# Patient Record
Sex: Male | Born: 1965 | Race: White | Hispanic: No | Marital: Married | Smoking: Current every day smoker
Health system: Southern US, Community
[De-identification: ages and names within clinical notes are randomized; demographics above are authoritative.]

## PROBLEM LIST (undated history)

## (undated) DIAGNOSIS — I10 Essential (primary) hypertension: Secondary | ICD-10-CM

## (undated) DIAGNOSIS — E785 Hyperlipidemia, unspecified: Secondary | ICD-10-CM

## (undated) DIAGNOSIS — K429 Umbilical hernia without obstruction or gangrene: Secondary | ICD-10-CM

## (undated) DIAGNOSIS — T7840XA Allergy, unspecified, initial encounter: Secondary | ICD-10-CM

## (undated) HISTORY — DX: Essential (primary) hypertension: I10

## (undated) HISTORY — DX: Hyperlipidemia, unspecified: E78.5

## (undated) HISTORY — PX: UVULOPALATOPHARYNGOPLASTY: SHX827

## (undated) HISTORY — PX: VARICOSE VEIN SURGERY: SHX832

## (undated) HISTORY — DX: Allergy, unspecified, initial encounter: T78.40XA

---

## 2012-09-03 ENCOUNTER — Ambulatory Visit: Payer: Self-pay | Admitting: Physician Assistant

## 2012-09-03 VITALS — BP 188/128 | HR 81 | Temp 98.5°F | Resp 18 | Ht 70.5 in | Wt 268.2 lb

## 2012-09-03 DIAGNOSIS — Z789 Other specified health status: Secondary | ICD-10-CM

## 2012-09-03 DIAGNOSIS — I10 Essential (primary) hypertension: Secondary | ICD-10-CM

## 2012-09-03 LAB — BASIC METABOLIC PANEL
CO2: 28 mEq/L (ref 19–32)
Chloride: 107 mEq/L (ref 96–112)
Creat: 0.71 mg/dL (ref 0.50–1.35)
Sodium: 140 mEq/L (ref 135–145)

## 2012-09-03 MED ORDER — YELLOW FEVER VACCINE ~~LOC~~ INJ: 0.5000 mL | INJECTION | Freq: Once | SUBCUTANEOUS | Status: DC

## 2012-09-03 MED ORDER — ONDANSETRON 4 MG PO TBDP: 4.0000 mg | ORAL_TABLET | Freq: Three times a day (TID) | ORAL | Status: DC | PRN

## 2012-09-03 MED ORDER — LISINOPRIL-HYDROCHLOROTHIAZIDE 10-12.5 MG PO TABS: 1.0000 | ORAL_TABLET | Freq: Every day | ORAL | Status: DC

## 2012-09-03 MED ORDER — DOXYCYCLINE HYCLATE 100 MG PO CAPS: ORAL_CAPSULE | ORAL | Status: DC

## 2012-09-03 MED ORDER — CIPROFLOXACIN HCL 250 MG PO TABS: 250.0000 mg | ORAL_TABLET | Freq: Two times a day (BID) | ORAL | Status: DC | PRN

## 2012-09-03 MED ORDER — TYPHOID VI POLYSACCHARIDE VACC 25 MCG/0.5ML IM SOLN: 0.5000 mL | Freq: Once | INTRAMUSCULAR | Status: DC

## 2012-09-03 NOTE — Progress Notes (Signed)
[name] [name] ID: [name] [name]. MRN: [id number], DOB: July 12, 1965, 47 y.o. Date of Encounter: 09/03/2012, 11:51 AM  Primary Physician: No primary provider on file.  Chief Complaint: Immunization review  HPI: 47 y.o. male with history below presents for immunization review. [name] [name] will be traveling to [place] on 09/07/12. Work related trip. Will be staying in [place], [place] for 6 weeks. [name] [name] travels out of the country frequently for work. Has one international immunization record with him, but has lost the other. This record does indicate an up to date tetanus vaccine from 2012 and a Typhim vaccine from 07/2010. He does believe that he has received the yellow fever vaccine within the past couple of years as he recently spent 6 months in [place], but this is not on the immunization record he brought today. The CDC travel guide does recommend he receive yellow fever vaccine for this trip. He also requests cipro for this trip. States that he has had hepatitis A and hepatitis B vaccines. Will not be around or handling animals.     Of note [name] [name] has a history of hypertension, BP at triage today is 188/128. No chest pain, headache, visual changes, or focal deficits. He does not have any chest pains, visual changes, or focal deficits. Mild headache currently. Blood pressure usually 120/80 when he takes his medication. States that "it starts with a T." PCP is in [place]. Dr. Ivory Broad. He is uncertain if he will be able to be able to see him prior to leaving the country. He does smoke daily, and knows that he must quit.   Past Medical History  Diagnosis Date  . Allergy   . Hypertension   . Hyperlipidemia      Home Meds: Prior to Admission medications   Not on File    Allergies: No Known Allergies  History   Social History  . Marital Status: Married    Spouse Name: N/A    Number of Children: N/A  . Years of Education: N/A   Occupational History  . Not on file.   Social History Main Topics   . Smoking status: Current Every Day Smoker -- 2.00 packs/day  . Smokeless tobacco: Not on file  . Alcohol Use: Not on file  . Drug Use: No  . Sexually Active: Yes   Other Topics Concern  . Not on file   Social History Narrative  . No narrative on file     Review of Systems: Constitutional: negative for chills, fever, night sweats, weight changes, or fatigue  HEENT: negative for vision changes, hearing loss, congestion, rhinorrhea, ST, epistaxis, or sinus pressure Cardiovascular: negative for chest pain, or palpitations Respiratory: negative for hemoptysis, wheezing, shortness of breath, or cough Abdominal: negative for abdominal pain, nausea, vomiting, diarrhea, or constipation Dermatological: negative for rash Neurologic: positive for mild headache. negative for dizziness, or syncope   Physical Exam: Blood pressure 188/128, pulse 81, temperature 98.5 F (36.9 C), temperature source Oral, resp. rate 18, height 5' 10.5" (1.791 m), weight 268 lb 3.2 oz (121.655 kg), SpO2 98.00%., Body mass index is 37.93 kg/(m^2). General: Well developed, well nourished, in no acute distress. Head: Normocephalic, atraumatic, eyes without discharge, sclera non-icteric, nares are without discharge.   Neck: Supple. Full ROM.  Lungs: Breathing is unlabored. Heart: Regular rate. Msk:  Strength and tone normal for age. Extremities/Skin: Warm and dry. No clubbing or cyanosis. No edema. No rashes or suspicious lesions. Neuro: Alert and oriented X 3. Moves all extremities spontaneously. Gait is normal. CNII-XII  grossly in tact. Psych:  Responds to questions appropriately with a normal affect.   Labs:  BMP pending  ASSESSMENT AND PLAN:  47 y.o. male here for travel medicine with uncontrolled hypertension.  1) Travel medicine -Doxycycline 100 mg 1 po daily, start 2 days before trip, continue daily, and 4 weeks after your trip. #72 no RF - Typhim 25 mcg/0.5 mL inj Inject 0.5 mL intramuscularly once #1  no RF -YF-Vax 0.5 mL inj Inject 0.5 mL subcutaneous once #1 no RF -Cipro 250 mg 1 po bid prn diarrhea #40 no RF -Zofran ODT 4 mg 1 po tid prn nausea #20 no RF -Up to date with tetanus, hepatitis A, and hepatitis B vaccines -Discussed [place] and Dengue prevention -No known rabies exposures  -Discussed safe travel tips  2) Hypertension -Uncontrolled -Unable to see his PCP prior to leaving the country -Given prescription of lisinopril/HCTZ 10/12.5 mg 1 po daily #90 no RF -Discussed with [name] [name] dangers of his elevated blood pressure, he understands this -Must quit tobacco use -RTC/ED precautions -Follow up when back in the country with [place] or PCP   Signed, Eula Listen, PA-C 09/03/2012 11:51 AM

## 2014-04-17 ENCOUNTER — Ambulatory Visit (INDEPENDENT_AMBULATORY_CARE_PROVIDER_SITE_OTHER): Payer: BC Managed Care – PPO | Admitting: Internal Medicine

## 2014-04-17 ENCOUNTER — Ambulatory Visit (INDEPENDENT_AMBULATORY_CARE_PROVIDER_SITE_OTHER): Payer: BC Managed Care – PPO

## 2014-04-17 VITALS — BP 164/118 | HR 76 | Temp 98.6°F | Resp 16 | Ht 71.0 in | Wt 256.0 lb

## 2014-04-17 DIAGNOSIS — Z79899 Other long term (current) drug therapy: Secondary | ICD-10-CM

## 2014-04-17 DIAGNOSIS — Z23 Encounter for immunization: Secondary | ICD-10-CM

## 2014-04-17 DIAGNOSIS — I10 Essential (primary) hypertension: Secondary | ICD-10-CM

## 2014-04-17 DIAGNOSIS — Z0000 Encounter for general adult medical examination without abnormal findings: Secondary | ICD-10-CM

## 2014-04-17 DIAGNOSIS — K435 Parastomal hernia without obstruction or  gangrene: Secondary | ICD-10-CM

## 2014-04-17 DIAGNOSIS — K429 Umbilical hernia without obstruction or gangrene: Secondary | ICD-10-CM

## 2014-04-17 DIAGNOSIS — Z125 Encounter for screening for malignant neoplasm of prostate: Secondary | ICD-10-CM

## 2014-04-17 LAB — POCT CBC
GRANULOCYTE PERCENT: 62.4 % (ref 37–80)
HEMATOCRIT: 48.7 % (ref 43.5–53.7)
HEMOGLOBIN: 16.3 g/dL (ref 14.1–18.1)
LYMPH, POC: 2.9 (ref 0.6–3.4)
MCH, POC: 31.1 pg (ref 27–31.2)
MCHC: 33.5 g/dL (ref 31.8–35.4)
MCV: 92.8 fL (ref 80–97)
MID (cbc): 0.5 (ref 0–0.9)
MPV: 8.9 fL (ref 0–99.8)
POC GRANULOCYTE: 5.7 (ref 2–6.9)
POC LYMPH PERCENT: 31.9 %L (ref 10–50)
POC MID %: 5.7 %M (ref 0–12)
Platelet Count, POC: 195 10*3/uL (ref 142–424)
RBC: 5.25 M/uL (ref 4.69–6.13)
RDW, POC: 14.4 %
WBC: 9.1 10*3/uL (ref 4.6–10.2)

## 2014-04-17 LAB — GLUCOSE, POCT (MANUAL RESULT ENTRY): POC GLUCOSE: 112 mg/dL — AB (ref 70–99)

## 2014-04-17 LAB — POCT UA - MICROSCOPIC ONLY
BACTERIA, U MICROSCOPIC: NEGATIVE
Casts, Ur, LPF, POC: NEGATIVE
Crystals, Ur, HPF, POC: NEGATIVE
Epithelial cells, urine per micros: NEGATIVE
Mucus, UA: NEGATIVE
RBC, URINE, MICROSCOPIC: NEGATIVE
Yeast, UA: NEGATIVE

## 2014-04-17 LAB — POCT URINALYSIS DIPSTICK
BILIRUBIN UA: NEGATIVE
Blood, UA: NEGATIVE
GLUCOSE UA: NEGATIVE
KETONES UA: NEGATIVE
LEUKOCYTES UA: NEGATIVE
Nitrite, UA: NEGATIVE
Protein, UA: 100
SPEC GRAV UA: 1.02
Urobilinogen, UA: 0.2
pH, UA: 7

## 2014-04-17 LAB — IFOBT (OCCULT BLOOD): IMMUNOLOGICAL FECAL OCCULT BLOOD TEST: NEGATIVE

## 2014-04-17 MED ORDER — LISINOPRIL-HYDROCHLOROTHIAZIDE 20-12.5 MG PO TABS: 1.0000 | ORAL_TABLET | Freq: Every day | ORAL | Status: DC

## 2014-04-17 MED ORDER — ATORVASTATIN CALCIUM 20 MG PO TABS: 20.0000 mg | ORAL_TABLET | Freq: Every day | ORAL | Status: DC

## 2014-04-17 NOTE — Progress Notes (Signed)
Subjective:    Patient ID: [name] RalphHerman Bauza Jr., male    DOB: May 03, 1966, 48 y.o.   MRN: [id number]  HPI  Patient is 48 years old male being seen for annual physical. He is being as well for his hypertension. Patient is a smoker for 25 years. Patient has a family history of diabetes and heart disease. Family history of hypertension.     Not taking his BP meds, does not want to quit smoking.  Review of Systems  Constitutional: Positive for diaphoresis and fatigue.  HENT: Negative.   Eyes: Negative.   Respiratory: Negative.   Cardiovascular: Negative.   Gastrointestinal: Negative.   Endocrine: Negative.   Genitourinary: Negative.   Musculoskeletal: Negative.   Skin: Negative.   Allergic/Immunologic: Negative.   Neurological: Negative.   Hematological: Negative.   Psychiatric/Behavioral: Negative.   All other systems reviewed and are negative.  EKG RBBB done for base line    Objective:   Physical Exam  Constitutional: He is oriented to person, place, and time. He appears well-developed and well-nourished.  HENT:  Head: Normocephalic.  Right Ear: External ear normal.  Left Ear: External ear normal.  Nose: Nose normal.  Mouth/Throat: Uvula is midline and mucous membranes are normal. Posterior oropharyngeal edema and posterior oropharyngeal erythema present.  Eyes: Conjunctivae and EOM are normal. Pupils are equal, round, and reactive to light.  Neck: Normal range of motion. Neck supple. No tracheal deviation present. No thyromegaly present.  Cardiovascular: Normal rate, regular rhythm, normal heart sounds and intact distal pulses.   Pulmonary/Chest: Effort normal. No accessory muscle usage. No tachypnea. No respiratory distress. He has decreased breath sounds. He has no wheezes. He has rhonchi. He has no rales.  Abdominal: Soft. Bowel sounds are normal. He exhibits mass. He exhibits no distension. There is no hepatosplenomegaly. There is tenderness. There is no rebound, no  guarding and no CVA tenderness. A hernia is present.    Tender reducible hernia, para umbilical  Genitourinary: Rectum normal, prostate normal and penis normal.  Musculoskeletal: Normal range of motion.  Neurological: He is alert and oriented to person, place, and time. He has normal reflexes. No cranial nerve deficit. He exhibits normal muscle tone. Coordination normal.  Psychiatric: He has a normal mood and affect. His behavior is normal. Judgment and thought content normal.   [facility] reading (PRIMARY) by  Dr.Mykayla Brinton fullness right and anterior mediastinum, increased markings suggestive of chronic bronchitis  Results for orders placed or performed in visit on 04/17/14  POCT CBC  Result Value Ref Range   WBC 9.1 4.6 - 10.2 K/uL   Lymph, poc 2.9 0.6 - 3.4   POC LYMPH PERCENT 31.9 10 - 50 %L   MID (cbc) 0.5 0 - 0.9   POC MID % 5.7 0 - 12 %M   POC Granulocyte 5.7 2 - 6.9   Granulocyte percent 62.4 37 - 80 %G   RBC 5.25 4.69 - 6.13 M/uL   Hemoglobin 16.3 14.1 - 18.1 g/dL   HCT, POC 40.948.7 81.143.5 - 53.7 %   MCV 92.8 80 - 97 fL   MCH, POC 31.1 27 - 31.2 pg   MCHC 33.5 31.8 - 35.4 g/dL   RDW, POC 91.414.4 %   Platelet Count, POC 195 142 - 424 K/uL   MPV 8.9 0 - 99.8 fL  POCT glucose (manual entry)  Result Value Ref Range   POC Glucose 112 (A) 70 - 99 mg/dl  IFOBT POC (occult bld, rslt in office)  Result  Value Ref Range   IFOBT Negative   POCT UA - Microscopic Only  Result Value Ref Range   WBC, Ur, HPF, POC 0-3    RBC, urine, microscopic neg    Bacteria, U Microscopic neg    Mucus, UA neg    Epithelial cells, urine per micros neg    Crystals, Ur, HPF, POC neg    Casts, Ur, LPF, POC neg    Yeast, UA neg   POCT urinalysis dipstick  Result Value Ref Range   Color, UA yellow    Clarity, UA clear    Glucose, UA neg    Bilirubin, UA neg    Ketones, UA neg    Spec Grav, UA 1.020    Blood, UA neg    pH, UA 7.0    Protein, UA 100    Urobilinogen, UA 0.2    Nitrite, UA neg     Leukocytes, UA Negative            Assessment & Plan:  Quit smoking info Lisinopril 20mg /hct 12.5 qd Dash diet Flu vaccine Refer to surgeon for umbilical hernia

## 2014-04-17 NOTE — Patient Instructions (Addendum)
Hypertension Hypertension, commonly called high blood pressure, is when the force of blood pumping through your arteries is too strong. Your arteries are the blood vessels that carry blood from your heart throughout your body. A blood pressure reading consists of a higher number over a lower number, such as 110/72. The higher number (systolic) is the pressure inside your arteries when your heart pumps. The lower number (diastolic) is the pressure inside your arteries when your heart relaxes. Ideally you want your blood pressure below 120/80. Hypertension forces your heart to work harder to pump blood. Your arteries may become narrow or stiff. Having hypertension puts you at risk for heart disease, stroke, and other problems.  RISK FACTORSSmoking Cessation Quitting smoking is important to your health and has many advantages. However, it is not always easy to quit since nicotine is a very addictive drug. Oftentimes, people try 3 times or more before being able to quit. This document explains the best ways for you to prepare to quit smoking. Quitting takes hard work and a lot of effort, but you can do it. ADVANTAGES OF QUITTING SMOKING  You will live longer, feel better, and live better.  Your body will feel the impact of quitting smoking almost immediately.  Within 20 minutes, blood pressure decreases. Your pulse returns to its normal level.  After 8 hours, carbon monoxide levels in the blood return to normal. Your oxygen level increases.  After 24 hours, the chance of having a heart attack starts to decrease. Your breath, hair, and body stop smelling like smoke.  After 48 hours, damaged nerve endings begin to recover. Your sense of taste and smell improve.  After 72 hours, the body is virtually free of nicotine. Your bronchial tubes relax and breathing becomes easier.  After 2 to 12 weeks, lungs can hold more air. Exercise becomes easier and circulation improves.  The risk of having a heart  attack, stroke, cancer, or lung disease is greatly reduced.  After 1 year, the risk of coronary heart disease is cut in half.  After 5 years, the risk of stroke falls to the same as a nonsmoker.  After 10 years, the risk of lung cancer is cut in half and the risk of other cancers decreases significantly.  After 15 years, the risk of coronary heart disease drops, usually to the level of a nonsmoker.  If you are pregnant, quitting smoking will improve your chances of having a healthy baby.  The people you live with, especially any children, will be healthier.  You will have extra money to spend on things other than cigarettes. QUESTIONS TO THINK ABOUT BEFORE ATTEMPTING TO QUIT You may want to talk about your answers with your health care provider.  Why do you want to quit?  If you tried to quit in the past, what helped and what did not?  What will be the most difficult situations for you after you quit? How will you plan to handle them?  Who can help you through the tough times? Your family? Friends? A health care provider?  What pleasures do you get from smoking? What ways can you still get pleasure if you quit? Here are some questions to ask your health care provider:  How can you help me to be successful at quitting?  What medicine do you think would be best for me and how should I take it?  What should I do if I need more help?  What is smoking withdrawal like? How can I get  information on withdrawal? GET READY  Set a quit date.  Change your environment by getting rid of all cigarettes, ashtrays, matches, and lighters in your home, car, or work. Do not let people smoke in your home.  Review your past attempts to quit. Think about what worked and what did not. GET SUPPORT AND ENCOURAGEMENT You have a better chance of being successful if you have help. You can get support in many ways.  Tell your family, friends, and coworkers that you are going to quit and need their  support. Ask them not to smoke around you.  Get individual, group, or telephone counseling and support. Programs are available at [employer] and health centers. Call your local health department for information about programs in your area.  Spiritual beliefs and practices may help some smokers quit.  Download a "quit meter" on your computer to keep track of quit statistics, such as how long you have gone without smoking, cigarettes not smoked, and money saved.  Get a self-help book about quitting smoking and staying off tobacco. LEARN NEW SKILLS AND BEHAVIORS  Distract yourself from urges to smoke. Talk to someone, go for a walk, or occupy your time with a task.  Change your normal routine. Take a different route to work. Drink tea instead of coffee. Eat breakfast in a different place.  Reduce your stress. Take a hot bath, exercise, or read a book.  Plan something enjoyable to do every day. Reward yourself for not smoking.  Explore interactive web-based programs that specialize in helping you quit. GET MEDICINE AND USE IT CORRECTLY Medicines can help you stop smoking and decrease the urge to smoke. Combining medicine with the above behavioral methods and support can greatly increase your chances of successfully quitting smoking.  Nicotine replacement therapy helps deliver nicotine to your body without the negative effects and risks of smoking. Nicotine replacement therapy includes nicotine gum, lozenges, inhalers, nasal sprays, and skin patches. Some may be available over-the-counter and others require a prescription.  Antidepressant medicine helps people abstain from smoking, but how this works is unknown. This medicine is available by prescription.  Nicotinic receptor partial agonist medicine simulates the effect of nicotine in your brain. This medicine is available by prescription. Ask your health care provider for advice about which medicines to use and how to use them based on  your health history. Your health care provider will tell you what side effects to look out for if you choose to be on a medicine or therapy. Carefully read the information on the package. Do not use any other product containing nicotine while using a nicotine replacement product.  RELAPSE OR DIFFICULT SITUATIONS Most relapses occur within the first 3 months after quitting. Do not be discouraged if you start smoking again. Remember, most people try several times before finally quitting. You may have symptoms of withdrawal because your body is used to nicotine. You may crave cigarettes, be irritable, feel very hungry, cough often, get headaches, or have difficulty concentrating. The withdrawal symptoms are only temporary. They are strongest when you first quit, but they will go away within 10-14 days. To reduce the chances of relapse, try to:  Avoid drinking alcohol. Drinking lowers your chances of successfully quitting.  Reduce the amount of caffeine you consume. Once you quit smoking, the amount of caffeine in your body increases and can give you symptoms, such as a rapid heartbeat, sweating, and anxiety.  Avoid smokers because they can make you want to smoke.  Do not let weight gain distract you. Many smokers will gain weight when they quit, usually less than 10 pounds. Eat a healthy diet and stay active. You can always lose the weight gained after you quit.  Find ways to improve your mood other than smoking. FOR MORE INFORMATION  www.smokefree.gov  Document Released: 05/23/2001 Document Revised: 10/13/2013 Document Reviewed: 09/07/2011 [facility] Patient Information 2015 [place], [place]. This information is not intended to replace advice given to you by your health care provider. Make sure you discuss any questions you have with your health care provider.  Some risk factors for high blood pressure are controllable. Others are not.  Risk factors you cannot control include:   Race. You may be at  higher risk if you are African American.  Age. Risk increases with age.  Gender. Men are at higher risk than women before age 49 years. After age 49, women are at higher risk than men. Risk factors you can control include:  Not getting enough exercise or physical activity.  Being overweight.  Getting too much fat, sugar, calories, or salt in your diet.  Drinking too much alcohol. SIGNS AND SYMPTOMS Hypertension does not usually cause signs or symptoms. Extremely high blood pressure (hypertensive crisis) may cause headache, anxiety, shortness of breath, and nosebleed. DIAGNOSIS  To check if you have hypertension, your health care provider will measure your blood pressure while you are seated, with your arm held at the level of your heart. It should be measured at least twice using the same arm. Certain conditions can cause a difference in blood pressure between your right and left arms. A blood pressure reading that is higher than normal on one occasion does not mean that you need treatment. If one blood pressure reading is high, ask your health care provider about having it checked again. TREATMENT  Treating high blood pressure includes making lifestyle changes and possibly taking medicine. Living a healthy lifestyle can help lower high blood pressure. You may need to change some of your habits. Lifestyle changes may include:  Following the DASH diet. This diet is high in fruits, vegetables, and whole grains. It is low in salt, red meat, and added sugars.  Getting at least 2 hours of brisk physical activity every week.  Losing weight if necessary.  Not smoking.  Limiting alcoholic beverages.  Learning ways to reduce stress. If lifestyle changes are not enough to get your blood pressure under control, your health care provider may prescribe medicine. You may need to take more than one. Work closely with your health care provider to understand the risks and benefits. HOME CARE  INSTRUCTIONS  Have your blood pressure rechecked as directed by your health care provider.   Take medicines only as directed by your health care provider. Follow the directions carefully. Blood pressure medicines must be taken as prescribed. The medicine does not work as well when you skip doses. Skipping doses also puts you at risk for problems.   Do not smoke.   Monitor your blood pressure at home as directed by your health care provider. SEEK MEDICAL CARE IF:   You think you are having a reaction to medicines taken.  You have recurrent headaches or feel dizzy.  You have swelling in your ankles.  You have trouble with your vision. SEEK IMMEDIATE MEDICAL CARE IF:  You develop a severe headache or confusion.  You have unusual weakness, numbness, or feel faint.  You have severe chest or abdominal pain.  You vomit repeatedly.  You have trouble breathing. MAKE SURE YOU:   Understand these instructions.  Will watch your condition.  Will get help right away if you are not doing well or get worse. Document Released: 05/29/2005 Document Revised: 10/13/2013 Document Reviewed: 03/21/2013 [facility] Patient Information 2015 [place], [place]. This information is not intended to replace advice given to you by your health care provider. Make sure you discuss any questions you have with your health care provider. DASH Eating Plan DASH stands for "Dietary Approaches to Stop Hypertension." The DASH eating plan is a healthy eating plan that has been shown to reduce high blood pressure (hypertension). Additional health benefits may include reducing the risk of type 2 diabetes mellitus, heart disease, and stroke. The DASH eating plan may also help with weight loss. WHAT DO I NEED TO KNOW ABOUT THE DASH EATING PLAN? For the DASH eating plan, you will follow these general guidelines:  Choose foods with a percent daily value for sodium of less than 5% (as listed on the food label).  Use  salt-free seasonings or herbs instead of table salt or sea salt.  Check with your health care provider or pharmacist before using salt substitutes.  Eat lower-sodium products, often labeled as "lower sodium" or "no salt added."  Eat fresh foods.  Eat more vegetables, fruits, and low-fat dairy products.  Choose whole grains. Look for the word "whole" as the first word in the ingredient list.  Choose fish and skinless chicken or [place] more often than red meat. Limit fish, poultry, and meat to 6 oz (170 g) each day.  Limit sweets, desserts, sugars, and sugary drinks.  Choose heart-healthy fats.  Limit cheese to 1 oz (28 g) per day.  Eat more home-cooked food and less restaurant, buffet, and fast food.  Limit fried foods.  Cook foods using methods other than frying.  Limit canned vegetables. If you do use them, rinse them well to decrease the sodium.  When eating at a restaurant, ask that your food be prepared with less salt, or no salt if possible. WHAT FOODS CAN I EAT? Seek help from a dietitian for individual calorie needs. Grains Whole grain or whole wheat bread. Brown rice. Whole grain or whole wheat pasta. Quinoa, bulgur, and whole grain cereals. Low-sodium cereals. Corn or whole wheat flour tortillas. Whole grain cornbread. Whole grain crackers. Low-sodium crackers. Vegetables Fresh or frozen vegetables (raw, steamed, roasted, or grilled). Low-sodium or reduced-sodium tomato and vegetable juices. Low-sodium or reduced-sodium tomato sauce and paste. Low-sodium or reduced-sodium canned vegetables.  Fruits All fresh, canned (in natural juice), or frozen fruits. Meat and Other Protein Products Ground beef (85% or leaner), grass-fed beef, or beef trimmed of fat. Skinless chicken or [place]. Ground chicken or [place]. Pork trimmed of fat. All fish and seafood. Eggs. Dried beans, peas, or lentils. Unsalted nuts and seeds. Unsalted canned beans. Dairy Low-fat dairy products, such as  skim or 1% milk, 2% or reduced-fat cheeses, low-fat ricotta or cottage cheese, or plain low-fat yogurt. Low-sodium or reduced-sodium cheeses. Fats and Oils Tub margarines without trans fats. Light or reduced-fat mayonnaise and salad dressings (reduced sodium). Avocado. Safflower, olive, or canola oils. Natural peanut or almond butter. Other Unsalted popcorn and pretzels. The items listed above may not be a complete list of recommended foods or beverages. Contact your dietitian for more options. WHAT FOODS ARE NOT RECOMMENDED? Grains White bread. White pasta. White rice. Refined cornbread. Bagels and croissants. Crackers that contain trans fat. Vegetables Creamed or fried vegetables. Vegetables in  a cheese sauce. Regular canned vegetables. Regular canned tomato sauce and paste. Regular tomato and vegetable juices. Fruits Dried fruits. Canned fruit in light or heavy syrup. Fruit juice. Meat and Other Protein Products Fatty cuts of meat. Ribs, chicken wings, bacon, sausage, bologna, salami, chitterlings, fatback, hot dogs, bratwurst, and packaged luncheon meats. Salted nuts and seeds. Canned beans with salt. Dairy Whole or 2% milk, cream, half-and-half, and cream cheese. Whole-fat or sweetened yogurt. Full-fat cheeses or blue cheese. Nondairy creamers and whipped toppings. Processed cheese, cheese spreads, or cheese curds. Condiments Onion and garlic salt, seasoned salt, table salt, and sea salt. Canned and packaged gravies. Worcestershire sauce. Tartar sauce. Barbecue sauce. Teriyaki sauce. Soy sauce, including reduced sodium. Steak sauce. Fish sauce. Oyster sauce. Cocktail sauce. Horseradish. Ketchup and mustard. Meat flavorings and tenderizers. Bouillon cubes. Hot sauce. Tabasco sauce. Marinades. Taco seasonings. Relishes. Fats and Oils Butter, stick margarine, lard, shortening, ghee, and bacon fat. Coconut, palm kernel, or palm oils. Regular salad dressings. Other Pickles and olives. Salted  popcorn and pretzels. The items listed above may not be a complete list of foods and beverages to avoid. Contact your dietitian for more information. WHERE CAN I FIND MORE INFORMATION? National Heart, Lung, and Blood Institute: [employer].itwww.nhlbi.nih.gov/health/health-topics/topics/dash/ Document Released: 05/18/2011 Document Revised: 10/13/2013 Document Reviewed: 04/02/2013 [facility] Patient Information 2015 [place], [place]. This information is not intended to replace advice given to you by your health care provider. Make sure you discuss any questions you have with your health care provider. Hernia A hernia occurs when an internal organ pushes out through a weak spot in the abdominal wall. Hernias most commonly occur in the groin and around the navel. Hernias often can be pushed back into place (reduced). Most hernias tend to get worse over time. Some abdominal hernias can get stuck in the opening (irreducible or incarcerated hernia) and cannot be reduced. An irreducible abdominal hernia which is tightly squeezed into the opening is at risk for impaired blood supply (strangulated hernia). A strangulated hernia is a medical emergency. Because of the risk for an irreducible or strangulated hernia, surgery may be recommended to repair a hernia. CAUSES   Heavy lifting.  Prolonged coughing.  Straining to have a bowel movement.  A cut (incision) made during an abdominal surgery. HOME CARE INSTRUCTIONS   Bed rest is not required. You may continue your normal activities.  Avoid lifting more than 10 pounds (4.5 kg) or straining.  Cough gently. If you are a smoker it is best to stop. Even the best hernia repair can break down with the continual strain of coughing. Even if you do not have your hernia repaired, a cough will continue to aggravate the problem.  Do not wear anything tight over your hernia. Do not try to keep it in with an outside bandage or truss. These can damage abdominal contents if they are  trapped within the hernia sac.  Eat a normal diet.  Avoid constipation. Straining over long periods of time will increase hernia size and encourage breakdown of repairs. If you cannot do this with diet alone, stool softeners may be used. SEEK IMMEDIATE MEDICAL CARE IF:   You have a fever.  You develop increasing abdominal pain.  You feel nauseous or vomit.  Your hernia is stuck outside the abdomen, looks discolored, feels hard, or is tender.  You have any changes in your bowel habits or in the hernia that are unusual for you.  You have increased pain or swelling around the hernia.  You  cannot push the hernia back in place by applying gentle pressure while lying down. MAKE SURE YOU:   Understand these instructions.  Will watch your condition.  Will get help right away if you are not doing well or get worse. Document Released: 05/29/2005 Document Revised: 08/21/2011 Document Reviewed: 01/16/2008 [facility] Patient Information 2015 [place], [place]. This information is not intended to replace advice given to you by your health care provider. Make sure you discuss any questions you have with your health care provider.

## 2014-04-18 LAB — LIPID PANEL
Cholesterol: 177 mg/dL (ref 0–200)
HDL: 61 mg/dL (ref >39)
LDL Cholesterol: 91 mg/dL (ref 0–99)
TRIGLYCERIDES: 125 mg/dL (ref <150)
Total CHOL/HDL Ratio: 2.9 Ratio
VLDL: 25 mg/dL (ref 0–40)

## 2014-04-18 LAB — COMPREHENSIVE METABOLIC PANEL
ALBUMIN: 3.9 g/dL (ref 3.5–5.2)
ALT: 15 U/L (ref 0–53)
AST: 17 U/L (ref 0–37)
Alkaline Phosphatase: 72 U/L (ref 39–117)
BUN: 14 mg/dL (ref 6–23)
CO2: 25 meq/L (ref 19–32)
Calcium: 9.1 mg/dL (ref 8.4–10.5)
Chloride: 104 mEq/L (ref 96–112)
Creat: 0.67 mg/dL (ref 0.50–1.35)
GLUCOSE: 111 mg/dL — AB (ref 70–99)
POTASSIUM: 4.4 meq/L (ref 3.5–5.3)
SODIUM: 141 meq/L (ref 135–145)
TOTAL PROTEIN: 6.5 g/dL (ref 6.0–8.3)
Total Bilirubin: 0.7 mg/dL (ref 0.2–1.2)

## 2014-04-18 LAB — TSH: TSH: 1.255 u[IU]/mL (ref 0.350–4.500)

## 2014-04-18 LAB — PSA: PSA: 1.91 ng/mL (ref <=4.00)

## 2014-04-19 ENCOUNTER — Encounter: Payer: Self-pay | Admitting: *Deleted

## 2015-09-18 ENCOUNTER — Other Ambulatory Visit: Payer: Self-pay | Admitting: Internal Medicine

## 2015-12-09 ENCOUNTER — Other Ambulatory Visit: Payer: Self-pay | Admitting: Internal Medicine

## 2016-01-14 ENCOUNTER — Inpatient Hospital Stay (HOSPITAL_COMMUNITY)
Admission: EM | Admit: 2016-01-14 | Discharge: 2016-01-16 | DRG: 065 | Disposition: A | Discharge status: Rehabilitation Facility | Payer: Managed Care, Other (non HMO) | Attending: Internal Medicine | Admitting: Internal Medicine

## 2016-01-14 ENCOUNTER — Emergency Department (HOSPITAL_COMMUNITY): Payer: Managed Care, Other (non HMO)

## 2016-01-14 ENCOUNTER — Encounter (HOSPITAL_COMMUNITY): Payer: Self-pay

## 2016-01-14 DIAGNOSIS — Z79899 Other long term (current) drug therapy: Secondary | ICD-10-CM

## 2016-01-14 DIAGNOSIS — Z6837 Body mass index (BMI) 37.0-37.9, adult: Secondary | ICD-10-CM

## 2016-01-14 DIAGNOSIS — Z8249 Family history of ischemic heart disease and other diseases of the circulatory system: Secondary | ICD-10-CM

## 2016-01-14 DIAGNOSIS — E876 Hypokalemia: Secondary | ICD-10-CM | POA: Diagnosis present

## 2016-01-14 DIAGNOSIS — E669 Obesity, unspecified: Secondary | ICD-10-CM | POA: Diagnosis present

## 2016-01-14 DIAGNOSIS — I6302 Cerebral infarction due to thrombosis of basilar artery: Secondary | ICD-10-CM | POA: Diagnosis not present

## 2016-01-14 DIAGNOSIS — I639 Cerebral infarction, unspecified: Secondary | ICD-10-CM

## 2016-01-14 DIAGNOSIS — F17200 Nicotine dependence, unspecified, uncomplicated: Secondary | ICD-10-CM

## 2016-01-14 DIAGNOSIS — R2990 Unspecified symptoms and signs involving the nervous system: Secondary | ICD-10-CM

## 2016-01-14 DIAGNOSIS — I6350 Cerebral infarction due to unspecified occlusion or stenosis of unspecified cerebral artery: Secondary | ICD-10-CM | POA: Diagnosis not present

## 2016-01-14 DIAGNOSIS — R471 Dysarthria and anarthria: Secondary | ICD-10-CM | POA: Diagnosis present

## 2016-01-14 DIAGNOSIS — G8191 Hemiplegia, unspecified affecting right dominant side: Secondary | ICD-10-CM | POA: Diagnosis present

## 2016-01-14 DIAGNOSIS — F17210 Nicotine dependence, cigarettes, uncomplicated: Secondary | ICD-10-CM | POA: Diagnosis present

## 2016-01-14 DIAGNOSIS — E785 Hyperlipidemia, unspecified: Secondary | ICD-10-CM | POA: Diagnosis present

## 2016-01-14 DIAGNOSIS — R531 Weakness: Secondary | ICD-10-CM

## 2016-01-14 DIAGNOSIS — I129 Hypertensive chronic kidney disease with stage 1 through stage 4 chronic kidney disease, or unspecified chronic kidney disease: Secondary | ICD-10-CM | POA: Diagnosis present

## 2016-01-14 DIAGNOSIS — N182 Chronic kidney disease, stage 2 (mild): Secondary | ICD-10-CM | POA: Diagnosis present

## 2016-01-14 DIAGNOSIS — N179 Acute kidney failure, unspecified: Secondary | ICD-10-CM | POA: Diagnosis present

## 2016-01-14 DIAGNOSIS — I10 Essential (primary) hypertension: Secondary | ICD-10-CM

## 2016-01-14 HISTORY — DX: Umbilical hernia without obstruction or gangrene: K42.9

## 2016-01-14 LAB — COMPREHENSIVE METABOLIC PANEL
ALBUMIN: 4.4 g/dL (ref 3.5–5.0)
ALT: 29 U/L (ref 17–63)
ANION GAP: 14 (ref 5–15)
AST: 48 U/L — AB (ref 15–41)
Alkaline Phosphatase: 70 U/L (ref 38–126)
BUN: 29 mg/dL — AB (ref 6–20)
CHLORIDE: 103 mmol/L (ref 101–111)
CO2: 22 mmol/L (ref 22–32)
Calcium: 9.3 mg/dL (ref 8.9–10.3)
Creatinine, Ser: 1.53 mg/dL — ABNORMAL HIGH (ref 0.61–1.24)
GFR calc Af Amer: 59 mL/min — ABNORMAL LOW (ref >60)
GFR calc non Af Amer: 51 mL/min — ABNORMAL LOW (ref >60)
GLUCOSE: 158 mg/dL — AB (ref 65–99)
POTASSIUM: 4.2 mmol/L (ref 3.5–5.1)
SODIUM: 139 mmol/L (ref 135–145)
Total Bilirubin: 1.7 mg/dL — ABNORMAL HIGH (ref 0.3–1.2)
Total Protein: 7.7 g/dL (ref 6.5–8.1)

## 2016-01-14 LAB — I-STAT CHEM 8, ED
BUN: 38 mg/dL — ABNORMAL HIGH (ref 6–20)
BUN: 28 mg/dL — AB (ref 6–20)
CALCIUM ION: 1.07 mmol/L — AB (ref 1.13–1.30)
CHLORIDE: 104 mmol/L (ref 101–111)
CHLORIDE: 104 mmol/L (ref 101–111)
Calcium, Ion: 1.11 mmol/L — ABNORMAL LOW (ref 1.13–1.30)
Creatinine, Ser: 1.4 mg/dL — ABNORMAL HIGH (ref 0.61–1.24)
Creatinine, Ser: 1.3 mg/dL — ABNORMAL HIGH (ref 0.61–1.24)
Glucose, Bld: 156 mg/dL — ABNORMAL HIGH (ref 65–99)
Glucose, Bld: 173 mg/dL — ABNORMAL HIGH (ref 65–99)
HEMATOCRIT: 51 % (ref 39.0–52.0)
HEMATOCRIT: 49 % (ref 39.0–52.0)
Hemoglobin: 17.3 g/dL — ABNORMAL HIGH (ref 13.0–17.0)
Hemoglobin: 16.7 g/dL (ref 13.0–17.0)
POTASSIUM: 4.1 mmol/L (ref 3.5–5.1)
POTASSIUM: 3.3 mmol/L — AB (ref 3.5–5.1)
SODIUM: 142 mmol/L (ref 135–145)
SODIUM: 143 mmol/L (ref 135–145)
TCO2: 26 mmol/L (ref 0–100)
TCO2: 23 mmol/L (ref 0–100)

## 2016-01-14 LAB — RAPID URINE DRUG SCREEN, HOSP PERFORMED
AMPHETAMINES: NOT DETECTED
Barbiturates: NOT DETECTED
Benzodiazepines: NOT DETECTED
Cocaine: NOT DETECTED
Opiates: NOT DETECTED
Tetrahydrocannabinol: NOT DETECTED

## 2016-01-14 LAB — CBC
HCT: 50.5 % (ref 39.0–52.0)
Hemoglobin: 17.5 g/dL — ABNORMAL HIGH (ref 13.0–17.0)
MCH: 32.2 pg (ref 26.0–34.0)
MCHC: 34.7 g/dL (ref 30.0–36.0)
MCV: 92.8 fL (ref 78.0–100.0)
PLATELETS: 178 10*3/uL (ref 150–400)
RBC: 5.44 MIL/uL (ref 4.22–5.81)
RDW: 13.4 % (ref 11.5–15.5)
WBC: 10.8 10*3/uL — ABNORMAL HIGH (ref 4.0–10.5)

## 2016-01-14 LAB — I-STAT TROPONIN, ED
TROPONIN I, POC: 0.05 ng/mL (ref 0.00–0.08)
Troponin i, poc: 0.04 ng/mL (ref 0.00–0.08)

## 2016-01-14 LAB — DIFFERENTIAL
BASOS ABS: 0 10*3/uL (ref 0.0–0.1)
Basophils Relative: 0 %
EOS ABS: 0.2 10*3/uL (ref 0.0–0.7)
EOS PCT: 1 %
Lymphocytes Relative: 20 %
Lymphs Abs: 2.1 10*3/uL (ref 0.7–4.0)
Monocytes Absolute: 0.8 10*3/uL (ref 0.1–1.0)
Monocytes Relative: 7 %
NEUTROS PCT: 72 %
Neutro Abs: 7.8 10*3/uL — ABNORMAL HIGH (ref 1.7–7.7)

## 2016-01-14 LAB — PROTIME-INR
INR: 0.91
PROTHROMBIN TIME: 12.3 s (ref 11.4–15.2)

## 2016-01-14 LAB — CBG MONITORING, ED
GLUCOSE-CAPILLARY: 171 mg/dL — AB (ref 65–99)
GLUCOSE-CAPILLARY: 184 mg/dL — AB (ref 65–99)

## 2016-01-14 LAB — ETHANOL: Alcohol, Ethyl (B): <5 mg/dL (ref <5)

## 2016-01-14 LAB — APTT: APTT: 27 s (ref 24–36)

## 2016-01-14 MED ORDER — ASPIRIN EC 325 MG PO TBEC
325.0000 mg | DELAYED_RELEASE_TABLET | Freq: Once | ORAL | Status: AC
  Administered 2016-01-14: 325 mg via ORAL
  Filled 2016-01-14: qty 1

## 2016-01-14 MED ORDER — IOPAMIDOL (ISOVUE-370) INJECTION 76%
INTRAVENOUS | Status: AC
  Administered 2016-01-14: 50 mL
  Filled 2016-01-14: qty 50

## 2016-01-14 NOTE — ED Notes (Signed)
Pt alert and oriented x4. Pt ambulatory. Pt not complaining of any pain currently. Pt has slight slurred speech that has improved since arrival as well as improved sensation and strength on the right side. Next neuro check at 0100.

## 2016-01-14 NOTE — ED Provider Notes (Signed)
Patient was seen and evaluated on arrival to the emergency department. Deficits continue to improve per Pt report. Appears well with waxing and waning deficits. Neurology at bedside on arrival.   Considered tPA, deficits not severe enough to proceed. After discussion of risks/benefits with Dr Amada Jupiter, will go for CTA neck/head to look for possible site of intervention.   Monitored in the ED with no worsening significant enough to change tPA recommendations.  Hospitalist was consulted for admission and will see the patient in the emergency department.    CRITICAL CARE Performed by: Lyndal Pulley Total critical care time: 30 minutes Critical care time was exclusive of separately billable procedures and treating other patients. Critical care was necessary to treat or prevent imminent or life-threatening deterioration. Critical care was time spent personally by me on the following activities: development of treatment plan with patient and/or surrogate as well as nursing, discussions with consultants, evaluation of patient's response to treatment, examination of patient, obtaining history from patient or surrogate, ordering and performing treatments and interventions, ordering and review of laboratory studies, ordering and review of radiographic studies, pulse oximetry and re-evaluation of patient's condition.     Lyndal Pulley, MD 01/14/16 [phone]

## 2016-01-14 NOTE — ED Triage Notes (Signed)
Pt states he was working in his yard and went to take a shower and he lost sensation in his left side and had slurred speech. Pt states this began about 1 hour ago at [employer]. Pt states that when he sat in triage chair his symptoms dissipated. Pt has equal sensation in both arms and symmetry in his face

## 2016-01-14 NOTE — Consult Note (Signed)
Neurology Consultation Reason for Consult: dysarthria Referring Physician: Clydene Pugh, D  CC: Dysarthria  History is obtained from:patient  HPI: [name] [name]. is a 50 y.o. male with a history of hypertension, hyperlipidemia who presents with right-sided numbness and weakness and dysarthria that started around 6:30 PM. He states that it initially was quite severe, and he is unable to use his right arm much at all. This then improved by the time he arrived at [facility] emergency room. After arrival, however, he again worsened prompting the ER physician to call a code stroke. I spoke with the ER physician and by the time of my speaking with her, his symptoms were rapidly improving again and therefore tPA was not administered but he was transported to the [facility] for further evaluation and observation.  On arrival to the ED, he had a very mild right pronator drift, dysarthria but not enough to be disabling and therefore TPA was not offered.   LKW: 6:30 PM tpa given?: no, mild symptoms    ROS: A 14 point ROS was performed and is negative except as noted in the HPI.  Past Medical History:  Diagnosis Date  . Allergy   . Hyperlipidemia   . Hypertension      Family history: No history of similar   Social History:  reports that he has been smoking.  He has been smoking about 2.00 packs per day. He does not have any smokeless tobacco history on file. He reports that he does not use drugs. His alcohol history is not on file.   Exam: Current vital signs: BP (!) 188/127   Pulse 76   Temp 97.8 F (36.6 C)   Resp 14   Ht  (1.803 m)   Wt 117.9 kg (260 lb)   SpO2 96%   BMI 36.26 kg/m  Vital signs in last 24 hours: Temp:  [97.8 F (36.6 C)-97.9 F (36.6 C)] 97.8 F (36.6 C) (08/04 2101) Pulse Rate:  [76-101] 76 (08/04 2215) Resp:  [11-24] 14 (08/04 2215) BP: (175-211)/(107-128) 188/127 (08/04 2215) SpO2:  [95 %-97 %] 96 % (08/04 2215) Weight:  [117.9 kg (260 lb)]  117.9 kg (260 lb) (08/04 1935)   Physical Exam  Constitutional: Appears well-developed and well-nourished.  Psych: Affect appropriate to situation Eyes: No scleral injection HENT: No OP obstrucion Head: Normocephalic.  Cardiovascular: Normal rate and regular rhythm.  Respiratory: Effort normal and breath sounds normal to anterior ascultation GI: Soft.  No distension. There is no tenderness.  Skin: WDI  Neuro: Mental Status: Patient is awake, alert, oriented to person, place, month, year, and situation. Patient is able to give a clear and coherent history. No signs of aphasia or neglect He has a mild dysarthria that was completely understandable Cranial Nerves: II: Visual Fields are full. Pupils are equal, round, and reactive to light.   III,IV, VI: EOMI without ptosis or diploplia.  V: Facial sensation is mildly reduced on the right VII: Facial movement is mildly weak on the right VIII: hearing is intact to voice X: Uvula elevates symmetrically XI: Shoulder shrug is symmetric. XII: tongue is midline without atrophy or fasciculations.  Motor: Tone is normal. Bulk is normal. 5/5 strength was present on the left side, though good strength to confrontation on the right he has very mild pronator drift as well as slowed rapid alternating movements. He has good strength in his right leg. 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 He is in the IV TPA window until 11 PM, and if he were to worsen prior to this then I would consider administration, but do not  feel that he has any disabling deficit at this time and therefore after discussion with the patient have decided against IV TPA at this time.  Recommendations: 1. HgbA1c, fasting lipid panel 2. MRI of the brain without contrast 3. Frequent neuro checks 4. Echocardiogram 5. Prophylactic therapy-Antiplatelet med: Aspirin - dose 325mg  PO or 300mg  PR 6. Risk factor modification 7. Telemetry monitoring 8. PT consult, OT consult, Speech consult 9. please page stroke NP  Or  PA  Or MD  8am -4 pm starting 8/5 as this patient will be followed by the stroke team at this point.   You can look them up on [email or web address]     Ritta Slot, MD [facility] [phone]  If 7pm- 7am, please page neurology on call as listed in AMION.

## 2016-01-14 NOTE — ED Provider Notes (Signed)
[facility]-EMERGENCY DEPT Provider Note   CSN: [id number] Arrival date & time: 01/14/16  4098  First Provider Contact:   First MD Initiated Contact with Patient 01/14/16 2010     By signing my name below, I, Rosario Adie, attest that this documentation has been prepared under the direction and in the presence of [employer], PA-C.  Electronically Signed: Rosario Adie, ED Scribe. 01/14/16. 8:23 PM.  History   Chief Complaint Chief Complaint  Patient presents with  . Stroke Symptoms   The history is provided by the patient and a friend. No language interpreter was used.   HPI Comments: Pacen Watford. is a 50 y.o. male with a PMHx of HLD and HTN (on Lisinopril), who presents to the Emergency Department complaining of sudden onset, gradually worsening, unilateral right-sided, moderate weakness onset ~2 hours ago. Pt reports that his symptoms moderately improved while in the ED, but have since worsened again. His friend notes that he was complaining of a right cramp in his leg throughout the day PTA, and that his speech has been mildly slurred since his weakness began. Pt also reports having a mild HA since the onset of his symptoms; however, denies a thunderclap onset. Pt states that he was working in the heat outside all day (for approximately ~7 hours), prior to the onset of his symptoms; however, states that he had been staying hydrated and taking breaks in the shade throughout. Upon finishing his outside work, he came inside, took a shower, and notes that as soon as he sat down that he "felt funny", and his sx progressed. Pt does smoke cigarettes. Denies a hx of strokes or MIs.  Denies CP, dizziness, abdominal pain, nausea, vomiting, syncope, or any other associated symptoms.  Past Medical History:  Diagnosis Date  . Allergy   . Hyperlipidemia   . Hypertension    There are no active problems to display for this patient.  Past Surgical History:  Procedure Laterality Date   . APPENDECTOMY      Home Medications    Prior to Admission medications   Medication Sig Start Date End Date Taking? Authorizing Provider  fluticasone (FLONASE) 50 MCG/ACT nasal spray Place 1-2 sprays into both nostrils daily as needed for rhinitis.    Yes Historical Provider, MD  lisinopril-hydrochlorothiazide (ZESTORETIC) 20-12.5 MG per tablet Take 1 tablet by mouth daily. 04/17/14  Yes Jonita Albee, MD   Family History No family history on file.  Social History Social History  Substance Use Topics  . Smoking status: Current Every Day Smoker    Packs/day: 2.00  . Smokeless tobacco: Not on file  . Alcohol use Not on file   Allergies   Review of patient's allergies indicates no known allergies.  Review of Systems Review of Systems  Cardiovascular: Negative for chest pain.  Gastrointestinal: Negative for abdominal pain, nausea and vomiting.  Neurological: Positive for speech difficulty, weakness (right-sided), numbness (right-sided) and headaches. Negative for dizziness and syncope.  All other systems reviewed and are negative.   Physical Exam Updated Vital Signs BP (!) 188/127   Pulse 76   Temp 97.8 F (36.6 C)   Resp 14   Ht  (1.803 m)   Wt 117.9 kg   SpO2 96%   BMI 36.26 kg/m   Physical Exam  Constitutional: He is oriented to person, place, and time. He appears well-developed and well-nourished.  HENT:  Head: Normocephalic and atraumatic.  Eyes: Conjunctivae and EOM are normal. Pupils are equal,  round, and reactive to light. Right eye exhibits no discharge. Left eye exhibits no discharge. No scleral icterus.  Neck: Normal range of motion. No JVD present. No tracheal deviation present.  Pulmonary/Chest: Effort normal. No stridor.  Neurological: He is alert and oriented to person, place, and time. No cranial nerve deficit. Coordination normal.  Right-sided pronator drift noted, right sided facial droop noted. Strength is near equal bilaterally, sensation is  intact bilaterally. Normal finger to nose testing. Cranial nerves 2-12 grossly intact.   Psychiatric: He has a normal mood and affect. His behavior is normal. Judgment and thought content normal.  Nursing note and vitals reviewed.  ED Treatments / Results  DIAGNOSTIC STUDIES: Oxygen Saturation is 97% on RA, normal by my interpretation.   COORDINATION OF CARE: 8:15 PM-Discussed next steps with pt. Pt verbalized understanding and is agreeable with the plan.   Labs (all labs ordered are listed, but only abnormal results are displayed) Labs Reviewed  CBC - Abnormal; Notable for the following:       Result Value   WBC 10.8 (*)    Hemoglobin 17.5 (*)    All other components within normal limits  DIFFERENTIAL - Abnormal; Notable for the following:    Neutro Abs 7.8 (*)    All other components within normal limits  COMPREHENSIVE METABOLIC PANEL - Abnormal; Notable for the following:    Glucose, Bld 158 (*)    BUN 29 (*)    Creatinine, Ser 1.53 (*)    AST 48 (*)    Total Bilirubin 1.7 (*)    GFR calc non Af Amer 51 (*)    GFR calc Af Amer 59 (*)    All other components within normal limits  CBG MONITORING, ED - Abnormal; Notable for the following:    Glucose-Capillary 171 (*)    All other components within normal limits  I-STAT CHEM 8, ED - Abnormal; Notable for the following:    BUN 38 (*)    Creatinine, Ser 1.40 (*)    Glucose, Bld 156 (*)    Calcium, Ion 1.07 (*)    Hemoglobin 17.3 (*)    All other components within normal limits  I-STAT CHEM 8, ED - Abnormal; Notable for the following:    Potassium 3.3 (*)    BUN 28 (*)    Creatinine, Ser 1.30 (*)    Glucose, Bld 173 (*)    Calcium, Ion 1.11 (*)    All other components within normal limits  CBG MONITORING, ED - Abnormal; Notable for the following:    Glucose-Capillary 184 (*)    All other components within normal limits  PROTIME-INR  APTT  ETHANOL  URINE RAPID DRUG SCREEN, HOSP PERFORMED  URINALYSIS, ROUTINE W  REFLEX MICROSCOPIC (NOT AT [facility])  Rosezena Sensor, ED  I-STAT TROPOININ, ED   EKG  EKG Interpretation  Date/Time:  Friday January 14 2016 19:30:43 EDT Ventricular Rate:  90 PR Interval:    QRS Duration: 158 QT Interval:  410 QTC Calculation: 502 R Axis:   -78 Text Interpretation:  Sinus rhythm Left atrial enlargement RBBB and LAFB Consider left ventricular hypertrophy Inferior infarct, acute Lateral leads are also involved no previous ekg available Confirmed by LITTLE MD, RACHEL [phone]) on 01/14/2016 7:35:14 PM      Radiology Ct Head Code Stroke W/o Cm  Result Date: 01/14/2016 CLINICAL DATA:  Code stroke. Left-sided numbness. Slurred speech. History of hypertension and hyperlipidemia. EXAM: CT HEAD WITHOUT CONTRAST TECHNIQUE: Contiguous axial images were obtained from  the base of the skull through the vertex without intravenous contrast. COMPARISON:  None. FINDINGS: There is no evidence of acute cortical infarct, intracranial hemorrhage, mass, midline shift, or extra-axial fluid collection. The ventricles and sulci are normal. There are extensive, patchy and confluent hypodensities in the cerebral white matter bilaterally. There is a 7 mm focus of mild hypoattenuation in the right thalamus suggestive of a lacunar infarct and of indeterminate age. Orbits are unremarkable. Mild right maxillary sinus mucosal thickening is partially visualized. The mastoid air cells are clear. No acute osseous abnormality is identified. ASPECTS [facility] Stroke Program Early CT Score, [email or web address]) - Ganglionic level infarction (caudate, lentiform nuclei, internal capsule, insula, M1-M3 cortex): 7 - Supraganglionic infarction (M4-M6 cortex): 3 Total score (0-10 with 10 being normal): 10 IMPRESSION: 1. No evidence of acute intracranial hemorrhage or acute cortical infarct. 2. Age indeterminate lacunar infarct in the right thalamus, possibly recent. 3. ASPECTS score 10. 4. Extensive cerebral white matter  disease, markedly advanced for age and nonspecific but may reflect chronic small vessel ischemia. These results were called by telephone at the time of interpretation on 01/14/2016 at 8:13 pm to Dr. Dalene Seltzer, who verbally acknowledged these results. Electronically Signed   By: Sebastian Ache M.D.   On: 01/14/2016 20:16    Procedures Procedures (including critical care time)  Medications Ordered in ED Medications  iopamidol (ISOVUE-370) 76 % injection (50 mLs  Contrast Given 01/14/16 2145)  aspirin EC tablet 325 mg (325 mg Oral Given 01/14/16 2201)     Initial Impression / Assessment and Plan / ED Course  I have reviewed the triage vital signs and the nursing notes.  Pertinent labs & imaging results that were available during my care of the patient were reviewed by me and considered in my medical decision making (see chart for details).  Clinical Course     Labs: INR, APRR, CBC w/ Differential, CMP, Troponin, Chem 8, EKG  Imaging: CT Head w/o CM,  Consults: Neurology   Therapeutics:  Discharge Meds:   Assessment/Plan:  50 year old male presents today with strokelike symptoms. Patient reports improvement of symptoms prior to my evaluation, but then again had worsening during my evaluation. I again evaluated the patient with improvement in symptoms. Patient had a CT scan here which did not show any acute findings, probable old right-sided infarct. Patient is stable here, Dr. Dalene Seltzer involved with patient care, she consult with neurology who instructed how patient sent over for further evaluation. Patient stable for transfer.     Final Clinical Impressions(s) / ED Diagnoses   Final diagnoses:  Stroke-like symptoms    New Prescriptions New Prescriptions   No medications on file   I personally performed the services described in this documentation, which was scribed in my presence. The recorded information has been reviewed and is accurate.    Eyvonne Mechanic, PA-C 01/14/16  2225    Alvira Monday, MD 01/18/16 [phone]

## 2016-01-14 NOTE — ED Notes (Signed)
Patient transported to CT 

## 2016-01-14 NOTE — Progress Notes (Signed)
Code Stroke called on 50 y.o male,  history of hypertension, hyperlipidemia who presents with right-sided numbness and weakness and dysarthria that started around 6:30 PM. Symptoms improved when he arrived to [facility] but he again worsened prompting the ER physician to call a code stroke. CT of head completed at [facility]. NIHSS completed  yielding 4 for slight facial droop, right arm drift, mild sensory impairment on right arm and slight slurred speech. On arrival to the [facility], the Patient had a very mild right pronator drift, wax and waning dysarthria but not enough to be disabling per Dr. Amada Jupiter Neurologist, TPA was not offered. Pt for admit to [facility] for full stroke work up.

## 2016-01-15 ENCOUNTER — Other Ambulatory Visit (HOSPITAL_COMMUNITY): Payer: Managed Care, Other (non HMO)

## 2016-01-15 ENCOUNTER — Encounter (HOSPITAL_COMMUNITY): Payer: Self-pay | Admitting: Internal Medicine

## 2016-01-15 ENCOUNTER — Observation Stay (HOSPITAL_COMMUNITY): Payer: Managed Care, Other (non HMO)

## 2016-01-15 DIAGNOSIS — Z79899 Other long term (current) drug therapy: Secondary | ICD-10-CM | POA: Diagnosis not present

## 2016-01-15 DIAGNOSIS — R2990 Unspecified symptoms and signs involving the nervous system: Secondary | ICD-10-CM

## 2016-01-15 DIAGNOSIS — N179 Acute kidney failure, unspecified: Secondary | ICD-10-CM

## 2016-01-15 DIAGNOSIS — I6302 Cerebral infarction due to thrombosis of basilar artery: Principal | ICD-10-CM

## 2016-01-15 DIAGNOSIS — N182 Chronic kidney disease, stage 2 (mild): Secondary | ICD-10-CM | POA: Diagnosis present

## 2016-01-15 DIAGNOSIS — E876 Hypokalemia: Secondary | ICD-10-CM | POA: Diagnosis present

## 2016-01-15 DIAGNOSIS — I10 Essential (primary) hypertension: Secondary | ICD-10-CM | POA: Diagnosis not present

## 2016-01-15 DIAGNOSIS — E785 Hyperlipidemia, unspecified: Secondary | ICD-10-CM

## 2016-01-15 DIAGNOSIS — M6289 Other specified disorders of muscle: Secondary | ICD-10-CM

## 2016-01-15 DIAGNOSIS — F17200 Nicotine dependence, unspecified, uncomplicated: Secondary | ICD-10-CM

## 2016-01-15 DIAGNOSIS — I6330 Cerebral infarction due to thrombosis of unspecified cerebral artery: Secondary | ICD-10-CM

## 2016-01-15 DIAGNOSIS — R471 Dysarthria and anarthria: Secondary | ICD-10-CM | POA: Diagnosis present

## 2016-01-15 DIAGNOSIS — I129 Hypertensive chronic kidney disease with stage 1 through stage 4 chronic kidney disease, or unspecified chronic kidney disease: Secondary | ICD-10-CM | POA: Diagnosis present

## 2016-01-15 DIAGNOSIS — E669 Obesity, unspecified: Secondary | ICD-10-CM | POA: Diagnosis present

## 2016-01-15 DIAGNOSIS — I6789 Other cerebrovascular disease: Secondary | ICD-10-CM | POA: Diagnosis not present

## 2016-01-15 DIAGNOSIS — Z8249 Family history of ischemic heart disease and other diseases of the circulatory system: Secondary | ICD-10-CM | POA: Diagnosis not present

## 2016-01-15 DIAGNOSIS — Z6837 Body mass index (BMI) 37.0-37.9, adult: Secondary | ICD-10-CM | POA: Diagnosis not present

## 2016-01-15 DIAGNOSIS — F17210 Nicotine dependence, cigarettes, uncomplicated: Secondary | ICD-10-CM | POA: Diagnosis present

## 2016-01-15 DIAGNOSIS — I639 Cerebral infarction, unspecified: Secondary | ICD-10-CM | POA: Insufficient documentation

## 2016-01-15 LAB — URINALYSIS, ROUTINE W REFLEX MICROSCOPIC
Glucose, UA: NEGATIVE mg/dL
Hgb urine dipstick: NEGATIVE
KETONES UR: 15 mg/dL — AB
LEUKOCYTES UA: NEGATIVE
NITRITE: NEGATIVE
PH: 5 (ref 5.0–8.0)
Protein, ur: 100 mg/dL — AB
SPECIFIC GRAVITY, URINE: 1.045 — AB (ref 1.005–1.030)

## 2016-01-15 LAB — LIPID PANEL
Cholesterol: 139 mg/dL (ref 0–200)
HDL: 37 mg/dL — ABNORMAL LOW (ref >40)
LDL Cholesterol: 77 mg/dL (ref 0–99)
Total CHOL/HDL Ratio: 3.8 RATIO
Triglycerides: 127 mg/dL (ref <150)
VLDL: 25 mg/dL (ref 0–40)

## 2016-01-15 LAB — URINE MICROSCOPIC-ADD ON: RBC / HPF: NONE SEEN RBC/hpf (ref 0–5)

## 2016-01-15 MED ORDER — ASPIRIN 300 MG RE SUPP: 300.0000 mg | Freq: Every day | RECTAL | Status: DC

## 2016-01-15 MED ORDER — SODIUM CHLORIDE 0.9 % IV SOLN
INTRAVENOUS | Status: DC
  Administered 2016-01-15: 01:00:00 via INTRAVENOUS

## 2016-01-15 MED ORDER — ACETAMINOPHEN 650 MG RE SUPP: 650.0000 mg | RECTAL | Status: DC | PRN

## 2016-01-15 MED ORDER — ATORVASTATIN CALCIUM 10 MG PO TABS
20.0000 mg | ORAL_TABLET | Freq: Every day | ORAL | Status: DC
  Administered 2016-01-15: 20 mg via ORAL
  Filled 2016-01-15: qty 2

## 2016-01-15 MED ORDER — HYDRALAZINE HCL 20 MG/ML IJ SOLN
20.0000 mg | Freq: Four times a day (QID) | INTRAMUSCULAR | Status: DC | PRN
  Administered 2016-01-15 – 2016-01-16 (×2): 20 mg via INTRAVENOUS
  Filled 2016-01-15 (×2): qty 1

## 2016-01-15 MED ORDER — ACETAMINOPHEN 325 MG PO TABS: 650.0000 mg | ORAL_TABLET | ORAL | Status: DC | PRN

## 2016-01-15 MED ORDER — ASPIRIN 325 MG PO TABS
325.0000 mg | ORAL_TABLET | Freq: Every day | ORAL | Status: DC
  Administered 2016-01-15 – 2016-01-16 (×2): 325 mg via ORAL
  Filled 2016-01-15 (×2): qty 1

## 2016-01-15 MED ORDER — STROKE: EARLY STAGES OF RECOVERY BOOK
Freq: Once | Status: AC
Start: 2016-01-15 — End: 2016-01-15
  Administered 2016-01-15: 01:00:00

## 2016-01-15 NOTE — Progress Notes (Signed)
Talked to echo tech, said will get the echo done in the morning. Johny,RN.

## 2016-01-15 NOTE — Progress Notes (Signed)
STROKE TEAM PROGRESS NOTE   HISTORY OF PRESENT ILLNESS (per record) [name] [name]. is a 50 y.o. male with a history of hypertension, hyperlipidemia who presents with right-sided numbness and weakness and dysarthria that started around 6:30 PM. He states that it initially was quite severe, and he is unable to use his right arm much at all. This then improved by the time he arrived at [facility] emergency room. After arrival, however, he again worsened prompting the ER physician to call a code stroke. I spoke with the ER physician and by the time of my speaking with her, his symptoms were rapidly improving again and therefore tPA was not administered but he was transported to the [facility] for further evaluation and observation.  On arrival to the ED, he had a very mild right pronator drift, dysarthria but not enough to be disabling and therefore TPA was not offered.   LKW: 6:30 PM tpa given?: no, mild symptoms   SUBJECTIVE (INTERVAL HISTORY) No family is at the bedside.  Overall he feels his condition is near resolved. No acute event overnight. He admitted smoking and not check BP at home. Not very compliant with medication at home.   OBJECTIVE Temp:  [97.8 F (36.6 C)-98.4 F (36.9 C)] 98.1 F (36.7 C) (08/05 0555) Pulse Rate:  [62-101] 62 (08/05 0555) Cardiac Rhythm: Normal sinus rhythm;Bundle branch block (08/05 0030) Resp:  [11-24] 20 (08/05 0555) BP: (138-211)/(63-128) 159/78 (08/05 0555) SpO2:  [95 %-98 %] 97 % (08/05 0555) Weight:  [117.9 kg (260 lb)] 117.9 kg (260 lb) (08/05 0010)  CBC:  Recent Labs Lab 01/14/16 1954 01/14/16 2005 01/14/16 2056  WBC 10.8*  --   --   NEUTROABS 7.8*  --   --   HGB 17.5* 17.3* 16.7  HCT 50.5 51.0 49.0  MCV 92.8  --   --   PLT 178  --   --     Basic Metabolic Panel:  Recent Labs Lab 01/14/16 1954 01/14/16 2005 01/14/16 2056  NA 139 142 143  K 4.2 4.1 3.3*  CL 103 104 104  CO2 22  --   --   GLUCOSE 158* 156* 173*   BUN 29* 38* 28*  CREATININE 1.53* 1.40* 1.30*  CALCIUM 9.3  --   --     Lipid Panel:    Component Value Date/Time   CHOL 139 01/15/2016 0543   TRIG 127 01/15/2016 0543   HDL 37 (L) 01/15/2016 0543   CHOLHDL 3.8 01/15/2016 0543   VLDL 25 01/15/2016 0543   LDLCALC 77 01/15/2016 0543   HgbA1c: No results found for: HGBA1C Urine Drug Screen:    Component Value Date/Time   LABOPIA NONE DETECTED 01/14/2016 2318   COCAINSCRNUR NONE DETECTED 01/14/2016 2318   LABBENZ NONE DETECTED 01/14/2016 2318   AMPHETMU NONE DETECTED 01/14/2016 2318   THCU NONE DETECTED 01/14/2016 2318   LABBARB NONE DETECTED 01/14/2016 2318      IMAGING I have personally reviewed the radiological images below and agree with the radiology interpretations.  Ct Angio Head and Neck W Or Wo Contrast 01/14/2016 1. No emergent large vessel occlusion or flow limiting proximal stenosis.  2. Decreased number of  distal left MCA branch vessels.  3. Mild cervical carotid artery atherosclerosis without significant stenosis.   Mr Brain Wo Contrast 01/15/2016 1. Acute subcentimeter infarct in the left pons.  2. Extensive chronic small vessel ischemic disease.   Ct Head Code Stroke W/o Cm 01/14/2016 1. No evidence  of acute intracranial hemorrhage or acute cortical infarct.  2. Age indeterminate lacunar infarct in the right thalamus, possibly recent.  3. ASPECTS score 10.  4. Extensive cerebral white matter disease, markedly advanced for age and nonspecific but may reflect chronic small vessel ischemia.   TEE - pending   PHYSICAL EXAM  Temp:  [97.8 F (36.6 C)-98.4 F (36.9 C)] 98 F (36.7 C) (08/05 1001) Pulse Rate:  [62-101] 66 (08/05 1001) Resp:  [11-24] 18 (08/05 1001) BP: (138-211)/(63-128) 162/109 (08/05 1001) SpO2:  [95 %-98 %] 98 % (08/05 1001) Weight:  [260 lb (117.9 kg)] 260 lb (117.9 kg) (08/05 0010)  General - Well nourished, well developed, in no apparent distress.  Ophthalmologic - Sharp disc  margins OU.   Cardiovascular - Regular rate and rhythm.  Mental Status -  Level of arousal and orientation to time, place, and person were intact. Language including expression, naming, repetition, comprehension was assessed and found intact. Fund of Knowledge was assessed and was intact.  Cranial Nerves II - XII - II - Visual field intact OU. III, IV, VI - Extraocular movements intact. V - Facial sensation intact bilaterally. VII - Facial movement intact bilaterally. VIII - Hearing & vestibular intact bilaterally. X - Palate elevates symmetrically. XI - Chin turning & shoulder shrug intact bilaterally. XII - Tongue protrusion intact.  Motor Strength - The patient's strength was normal in all extremities except right pronator drift was present.  Bulk was normal and fasciculations were absent.   Motor Tone - Muscle tone was assessed at the neck and appendages and was normal.  Reflexes - The patient's reflexes were 1+ in all extremities and he had no pathological reflexes.  Sensory - Light touch, temperature/pinprick were assessed and were symmetrical.    Coordination - The patient had normal movements in the hands and feet with no ataxia or dysmetria.  Tremor was absent.  Gait and Station - deferred.   ASSESSMENT/PLAN [name]. is a 50 y.o. male with history of hypertension, tobacco use, and hyperlipidemia  presenting with dysarthria, right-sided numbness and right-sided weakness. He did not receive IV t-PA due to improving deficits.  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  Resultant  Right pronator drift  MRI  Acute subcentimeter infarct in the left pons.   CTA head and neck - no large vessel stenosis or occlusion.  2D Echo - pending  LDL - 77  HgbA1c pending  VTE prophylaxis - SCDs  Diet Heart Room service appropriate? Yes; Fluid consistency: Thin  No antithrombotic prior to admission, now on aspirin 325 mg daily. Continue ASA on  discharge  Patient counseled to be compliant with his antithrombotic medications  Ongoing aggressive stroke risk factor management  Therapy recommendations:  pending  Disposition: Pending  Hypertension  Blood pressure elevated at times.  Permissive hypertension (OK if < 220/120) but gradually normalize in 5-7 days  Long-term BP goal normotensive  Check BP at home  Hyperlipidemia  Home meds: No lipid lowering medications prior to admission.  LDL 77, goal < 70  Add low-dose Lipitor 20 mg daily  Continue statin at discharge  Tobacco abuse  Current smoker  Smoking cessation counseling provided  Pt is willing to quit  Other Stroke Risk Factors  ETOH use, advised to drink no more than 1 - 2 drink(s) a day  Obesity, Body mass index is 37.31 kg/m., recommend weight loss, diet and exercise as appropriate   Hx stroke/TIA - Age indeterminate lacunar  infarct in the right thalamus, possibly recent.   Other Active Problems  Mild hypokalemia - K 3.3  Elevated glucose levels - hemoglobin A1c pending  Mild renal insufficiency (diuretic PTA - improving)  BUN 28 ; creatinine 1.3  Hospital day # 0  Neurology will sign off. Please call with questions. Pt will follow up with Darrol Angel NP at [facility] in about 2 months. Thanks for the consult.  Marvel Plan, MD PhD Stroke Neurology 01/15/2016 2:17 PM    To contact Stroke Continuity provider, please refer to [email or web address]. After hours, contact General Neurology

## 2016-01-15 NOTE — Evaluation (Signed)
Physical Therapy Evaluation and Discharge Patient Details Name: [name] [name]. MRN: [id number] DOB: 06-30-1965 Today's Date: 01/15/2016   History of Present Illness  [name] [name]. is a 50 y.o. gentleman with a history of HTN and HLD who feels that he was in his baseline state of health until approximately 6:30pm.  After working in his yard most of the day, he had taken a shower and was sitting down with a cigarette when he noticed coordination difficulties with his right hand.  He subsequently noticed right leg weakness, slurred speech with word finding difficulty, and "loss of control" of his right arm and leg.  He called a Network engineer, who took him to the ED at [facility] by private vehicle.  After triage assessment there, he was referred to [facility] for urgent neurology evaluation.  Clinical Impression  Pt admitted with above. Pt with no residual R sided weakness and symptoms have resolved. Pt functioning at independent level. Pt educated on FAST, stroke education. Pt with no further acute PT needs at this point. PT SIGNING OFF. Please re-consult if needed in future. Thank you.    Follow Up Recommendations No PT follow up    Equipment Recommendations  None recommended by PT    Recommendations for Other Services       Precautions / Restrictions Precautions Precautions: None Precaution Comments: educated on FAST, stroke edu Restrictions Weight Bearing Restrictions: No      Mobility  Bed Mobility Overal bed mobility: Independent                Transfers Overall transfer level: Independent Equipment used: None                Ambulation/Gait Ambulation/Gait assistance: Independent Ambulation Distance (Feet): 300 Feet Assistive device: None Gait Pattern/deviations: WFL(Within Functional Limits) Gait velocity: wfl Gait velocity interpretation: at or above normal speed for age/gender General Gait Details: no episodes of LOB  Stairs Stairs: Yes Stairs  assistance: Modified independent (Device/Increase time) Stair Management: One rail Right;Alternating pattern Number of Stairs: 10 General stair comments: no episodes of LOB  Wheelchair Mobility    Modified Rankin (Stroke Patients Only)       Balance                                 Standardized Balance Assessment Standardized Balance Assessment : Dynamic Gait Index   Dynamic Gait Index Level Surface: Normal Change in Gait Speed: Normal Gait with Horizontal Head Turns: Normal Gait with Vertical Head Turns: Normal Gait and Pivot Turn: Normal Step Over Obstacle: Normal Step Around Obstacles: Normal Steps: Mild Impairment Total Score: 23       Pertinent Vitals/Pain Pain Assessment: No/denies pain    Home Living Family/patient expects to be discharged to:: Private residence Living Arrangements: Spouse/significant other Available Help at Discharge: Family;Friend(s);Available PRN/intermittently Type of Home: House Home Access: Stairs to enter Entrance Stairs-Rails: Right Entrance Stairs-Number of Steps: 3 Home Layout: Two level        Prior Function Level of Independence: Independent         Comments: works     Higher education careers adviser   Dominant Hand: Right    Extremity/Trunk Assessment   Upper Extremity Assessment: Overall WFL for tasks assessed           Lower Extremity Assessment: Overall WFL for tasks assessed         Communication   Communication: No difficulties  Cognition  Arousal/Alertness: Awake/alert Behavior During Therapy: WFL for tasks assessed/performed Overall Cognitive Status: Within Functional Limits for tasks assessed                      General Comments      Exercises        Assessment/Plan    PT Assessment Patent does not need any further PT services  PT Diagnosis Generalized weakness   PT Problem List    PT Treatment Interventions     PT Goals (Current goals can be found in the Care Plan section)  Acute Rehab PT Goals Patient Stated Goal: home PT Goal Formulation: All assessment and education complete, DC therapy    Frequency     Barriers to discharge        Co-evaluation               End of Session Equipment Utilized During Treatment: Gait belt Activity Tolerance: Patient tolerated treatment well Patient left: in bed;with family/visitor present Nurse Communication: Mobility status    Functional Assessment Tool Used: clinical judgement Functional Limitation: Mobility: Walking and moving around Mobility: Walking and Moving Around Current Status (B6384): 0 percent impaired, limited or restricted Mobility: Walking and Moving Around Goal Status [phone]): 0 percent impaired, limited or restricted Mobility: Walking and Moving Around Discharge Status [phone]): 0 percent impaired, limited or restricted    Time: 2248-2500 PT Time Calculation (min) (ACUTE ONLY): 12 min   Charges:   PT Evaluation $PT Eval Low Complexity: 1 Procedure     PT G Codes:   PT G-Codes **NOT FOR INPATIENT CLASS** Functional Assessment Tool Used: clinical judgement Functional Limitation: Mobility: Walking and moving around Mobility: Walking and Moving Around Current Status (B7048): 0 percent impaired, limited or restricted Mobility: Walking and Moving Around Goal Status (G8916): 0 percent impaired, limited or restricted Mobility: Walking and Moving Around Discharge Status [phone]): 0 percent impaired, limited or restricted    Marcene Brawn 01/15/2016, 12:37 PM\  Lewis Shock, PT, DPT Pager #: [phone] Office #: [phone]

## 2016-01-15 NOTE — H&P (Signed)
History and Physical    [name] [name]. BDZ:[id number] DOB: November 10, 1965 DOA: 01/14/2016  PCP: No primary care provider on file.   Patient coming from: Home  Chief Complaint: Right sided weakness  HPI: [name] Mucha. is a 50 y.o. gentleman with a history of HTN and HLD who feels that he was in his baseline state of health until approximately 6:30pm.  After working in his yard most of the day, he had taken a shower and was sitting down with a cigarette when he noticed coordination difficulties with his right hand.  He subsequently noticed right leg weakness, slurred speech with word finding difficulty, and "loss of control" of his right arm and leg.  He called a Network engineer, who took him to the ED at [facility] by private vehicle.  After triage assessment there, he was referred to [facility] for urgent neurology evaluation.  He has never had symptoms like this before.  He has a mild headache.  No light-headedness or LOC.  No nausea or vomiting.  No chest pain or shortness of breath.   ED Course: CT scan does not show acute CVA but there was evidence an age indeterminate lacunar infarct in the right thalamus.  Extensive white matter disease also noted, advanced for age.  The patient was within the window for tPA, but with rapidly improving deficits, he ultimately declined with intervention.  Full strength aspirin therapy initiated.  Hospitalist asked to admit for further stroke evaluation.    Review of Systems: As per HPI otherwise 10 point review of systems negative.    Past Medical History:  Diagnosis Date  . Allergy   . Hernia, umbilical   . Hyperlipidemia   . Hypertension     Past Surgical History:  Procedure Laterality Date  . UVULOPALATOPHARYNGOPLASTY    . VARICOSE VEIN SURGERY       reports that he has been smoking.  He has been smoking about 2.00 packs per day. He has never used smokeless tobacco. He reports that he does not use drugs. His alcohol history is not on file.  He drinks  EtOH multiple times per week but denies history of withdrawal. He is married.  He has adult children.  No Known Allergies  Family History  Problem Relation Age of Onset  . Heart attack Father      Prior to Admission medications   Medication Sig Start Date End Date Taking? Authorizing Provider  fluticasone (FLONASE) 50 MCG/ACT nasal spray Place 1-2 sprays into both nostrils daily as needed for rhinitis.    Yes Historical Provider, MD  lisinopril-hydrochlorothiazide (ZESTORETIC) 20-12.5 MG per tablet Take 1 tablet by mouth daily. 04/17/14  Yes Jonita Albee, MD    Physical Exam: Vitals:   01/14/16 2315 01/14/16 2330 01/15/16 0010 01/15/16 0208  BP: (!) 171/120 (!) 186/115 (!) 169/96 (!) 176/112  Pulse: 75 79 78 69  Resp: 15 15 20 20   Temp:   98.2 F (36.8 C) 98.4 F (36.9 C)  TempSrc:   Oral Oral  SpO2: 97% 97% 97% 98%  Weight:   117.9 kg (260 lb)   Height:   5\' 10"  (1.778 m)       Constitutional: NAD, calm, comfortable, already eating a meal Vitals:   01/14/16 2315 01/14/16 2330 01/15/16 0010 01/15/16 0208  BP: (!) 171/120 (!) 186/115 (!) 169/96 (!) 176/112  Pulse: 75 79 78 69  Resp: 15 15 20 20   Temp:   98.2 F (36.8 C) 98.4 F (36.9 C)  TempSrc:   Oral Oral  SpO2: 97% 97% 97% 98%  Weight:   117.9 kg (260 lb)   Height:    (1.778 m)    Eyes: PERRL, lids and conjunctivae normal ENMT: Mucous membranes are moist. Posterior pharynx clear of any exudate or lesions. Normal dentition.  Neck: normal appearance, supple Respiratory: clear to auscultation bilaterally, no wheezing, no crackles. Normal respiratory effort. No accessory muscle use.  Cardiovascular: Normal rate, regular rhythm, no murmurs / rubs / gallops. No extremity edema. 2+ pedal pulses. No carotid bruits.  GI: abdomen is soft and compressible.  No distention.  No tenderness.  Bowel sounds are present. Musculoskeletal:  No joint deformity in upper and lower extremities. Good ROM, no contractures. Normal  muscle tone.  Skin: no rashes, warm and dry Neurologic: CN 2-12 grossly intact. Sensation intact, He continues to have very subtle right sided weakness.  Coordination and fine motor skills intact. Psychiatric: Normal judgment and insight. Alert and oriented x 3. Normal mood.     Labs on Admission: I have personally reviewed following labs and imaging studies  CBC:  Recent Labs Lab 01/14/16 1954 01/14/16 2005 01/14/16 2056  WBC 10.8*  --   --   NEUTROABS 7.8*  --   --   HGB 17.5* 17.3* 16.7  HCT 50.5 51.0 49.0  MCV 92.8  --   --   PLT 178  --   --    Basic Metabolic Panel:  Recent Labs Lab 01/14/16 1954 01/14/16 2005 01/14/16 2056  NA 139 142 143  K 4.2 4.1 3.3*  CL 103 104 104  CO2 22  --   --   GLUCOSE 158* 156* 173*  BUN 29* 38* 28*  CREATININE 1.53* 1.40* 1.30*  CALCIUM 9.3  --   --    GFR: Estimated Creatinine Clearance: 86.5 mL/min (by C-G formula based on SCr of 1.3 mg/dL). Liver Function Tests:  Recent Labs Lab 01/14/16 1954  AST 48*  ALT 29  ALKPHOS 70  BILITOT 1.7*  PROT 7.7  ALBUMIN 4.4   Coagulation Profile:  Recent Labs Lab 01/14/16 1954  INR 0.91   CBG:  Recent Labs Lab 01/14/16 1953 01/14/16 2050  GLUCAP 171* 184*   Urine analysis:    Component Value Date/Time   COLORURINE AMBER (A) 01/14/2016 2038   APPEARANCEUR CLEAR 01/14/2016 2038   LABSPEC 1.045 (H) 01/14/2016 2038   PHURINE 5.0 01/14/2016 2038   GLUCOSEU NEGATIVE 01/14/2016 2038   HGBUR NEGATIVE 01/14/2016 2038   BILIRUBINUR SMALL (A) 01/14/2016 2038   BILIRUBINUR neg 04/17/2014 1224   KETONESUR 15 (A) 01/14/2016 2038   PROTEINUR 100 (A) 01/14/2016 2038   UROBILINOGEN 0.2 04/17/2014 1224   NITRITE NEGATIVE 01/14/2016 2038   LEUKOCYTESUR NEGATIVE 01/14/2016 2038    Radiological Exams on Admission: Ct Angio Head W Or Wo Contrast  Result Date: 01/14/2016 CLINICAL DATA:  Stroke.  Right-sided weakness. EXAM: CT ANGIOGRAPHY HEAD AND NECK TECHNIQUE: Multidetector CT  imaging of the head and neck was performed using the standard protocol during bolus administration of intravenous contrast. Multiplanar CT image reconstructions and MIPs were obtained to evaluate the vascular anatomy. Carotid stenosis measurements (when applicable) are obtained utilizing NASCET criteria, using the distal internal carotid diameter as the denominator. CONTRAST:  50 mL Isovue 370 COMPARISON:  None. FINDINGS: CTA NECK Aortic arch: 3 vessel aortic arch. Brachiocephalic and subclavian arteries are widely patent. Right carotid system: Mild-to-moderate mixed calcified and noncalcified plaque at the carotid bifurcation and in the  proximal ICA without significant stenosis. Left carotid system: Mild plaque scattered in the proximal and mid common carotid artery without stenosis. Mild-to-moderate predominantly noncalcified plaque at the carotid bifurcation and in the proximal ICA without stenosis. Vertebral arteries:The vertebral arteries are patent and codominant. No significant stenosis is identified, although evaluation of the proximal vertebral arteries is limited in some areas by quantum mottle from patient body habitus. Skeleton: Mild cervical spondylosis. Other neck: No neck mass. Upper chest: Grossly clear lung apices. CTA HEAD Anterior circulation: The internal carotid arteries are patent from skullbase to carotid termini without stenosis. ACAs and MCAs are patent without evidence of major branch occlusion or significant proximal stenosis. The right A1 segment is dominant. There is a diminished number of the distal left MCA branch vessels compared to the right. No intracranial aneurysm is identified. Posterior circulation: The intracranial vertebral arteries are patent to the basilar without significant stenosis. Left PICA origin is patent. Right PICA is not identified. SCA origins are grossly patent. Basilar artery is patent without stenosis. There is a fetal type origin of the left PCA. No  significant proximal PCA stenosis is seen. Venous sinuses: Patent.  Hypoplastic left transverse sinus. Anatomic variants: Fetal type origin of the left PCA. IMPRESSION: 1. No emergent large vessel occlusion or flow limiting proximal stenosis. 2. Decreased number of  distal left MCA branch vessels. 3. Mild cervical carotid artery atherosclerosis without significant stenosis. Preliminary findings of no large vessel occlusion reviewed in person with Dr. Amada Jupiter on 01/14/2016 at 9:43 p.m. Electronically Signed   By: Sebastian Ache M.D.   On: 01/14/2016 22:27   Ct Angio Neck W And/or Wo Contrast  Result Date: 01/14/2016 CLINICAL DATA:  Stroke.  Right-sided weakness. EXAM: CT ANGIOGRAPHY HEAD AND NECK TECHNIQUE: Multidetector CT imaging of the head and neck was performed using the standard protocol during bolus administration of intravenous contrast. Multiplanar CT image reconstructions and MIPs were obtained to evaluate the vascular anatomy. Carotid stenosis measurements (when applicable) are obtained utilizing NASCET criteria, using the distal internal carotid diameter as the denominator. CONTRAST:  50 mL Isovue 370 COMPARISON:  None. FINDINGS: CTA NECK Aortic arch: 3 vessel aortic arch. Brachiocephalic and subclavian arteries are widely patent. Right carotid system: Mild-to-moderate mixed calcified and noncalcified plaque at the carotid bifurcation and in the proximal ICA without significant stenosis. Left carotid system: Mild plaque scattered in the proximal and mid common carotid artery without stenosis. Mild-to-moderate predominantly noncalcified plaque at the carotid bifurcation and in the proximal ICA without stenosis. Vertebral arteries:The vertebral arteries are patent and codominant. No significant stenosis is identified, although evaluation of the proximal vertebral arteries is limited in some areas by quantum mottle from patient body habitus. Skeleton: Mild cervical spondylosis. Other neck: No neck mass.  Upper chest: Grossly clear lung apices. CTA HEAD Anterior circulation: The internal carotid arteries are patent from skullbase to carotid termini without stenosis. ACAs and MCAs are patent without evidence of major branch occlusion or significant proximal stenosis. The right A1 segment is dominant. There is a diminished number of the distal left MCA branch vessels compared to the right. No intracranial aneurysm is identified. Posterior circulation: The intracranial vertebral arteries are patent to the basilar without significant stenosis. Left PICA origin is patent. Right PICA is not identified. SCA origins are grossly patent. Basilar artery is patent without stenosis. There is a fetal type origin of the left PCA. No significant proximal PCA stenosis is seen. Venous sinuses: Patent.  Hypoplastic left transverse sinus. Anatomic variants:  Fetal type origin of the left PCA. IMPRESSION: 1. No emergent large vessel occlusion or flow limiting proximal stenosis. 2. Decreased number of  distal left MCA branch vessels. 3. Mild cervical carotid artery atherosclerosis without significant stenosis. Preliminary findings of no large vessel occlusion reviewed in person with Dr. Amada Jupiter on 01/14/2016 at 9:43 p.m. Electronically Signed   By: Sebastian Ache M.D.   On: 01/14/2016 22:27   Ct Head Code Stroke W/o Cm  Result Date: 01/14/2016 CLINICAL DATA:  Code stroke. Left-sided numbness. Slurred speech. History of hypertension and hyperlipidemia. EXAM: CT HEAD WITHOUT CONTRAST TECHNIQUE: Contiguous axial images were obtained from the base of the skull through the vertex without intravenous contrast. COMPARISON:  None. FINDINGS: There is no evidence of acute cortical infarct, intracranial hemorrhage, mass, midline shift, or extra-axial fluid collection. The ventricles and sulci are normal. There are extensive, patchy and confluent hypodensities in the cerebral white matter bilaterally. There is a 7 mm focus of mild hypoattenuation  in the right thalamus suggestive of a lacunar infarct and of indeterminate age. Orbits are unremarkable. Mild right maxillary sinus mucosal thickening is partially visualized. The mastoid air cells are clear. No acute osseous abnormality is identified. ASPECTS [facility] Stroke Program Early CT Score, http://www.aspectsinstroke.com) - Ganglionic level infarction (caudate, lentiform nuclei, internal capsule, insula, M1-M3 cortex): 7 - Supraganglionic infarction (M4-M6 cortex): 3 Total score (0-10 with 10 being normal): 10 IMPRESSION: 1. No evidence of acute intracranial hemorrhage or acute cortical infarct. 2. Age indeterminate lacunar infarct in the right thalamus, possibly recent. 3. ASPECTS score 10. 4. Extensive cerebral white matter disease, markedly advanced for age and nonspecific but may reflect chronic small vessel ischemia. These results were called by telephone at the time of interpretation on 01/14/2016 at 8:13 pm to Dr. Dalene Seltzer, who verbally acknowledged these results. Electronically Signed   By: Sebastian Ache M.D.   On: 01/14/2016 20:16    EKG: Independently reviewed. NSR.  No acute ST elevation.  Similar to prior.  Assessment/Plan Principal Problem:   Stroke-like symptoms Active Problems:   Acute right-sided weakness   AKI (acute kidney injury) (HCC)   Accelerated hypertension   Right sided weakness      Right sided weakness and slurred speech concerning for acute CVA --Neurology consult greatly appreciated --He needs MRI --Complete echo in the AM --Telemetry monitoring --A1c and fasting lipid panel for risk factor stratification --Full strength aspirin daily for now --Permissive HTN  AKI --Hold HCTZ-lisinopril --Gentle IV fluids 75cc/hr x 10 hours --Repeat BMP in the AM   DVT prophylaxis: SCDs  Code Status: FULL Family Communication: Patient alone at time of admission Disposition Plan: Expect he will go home at discharge Consults called: Neurology Admission status:  Observation, telemetry   TIME SPENT: 60 minutes   Jerene Bears MD [facility] Pager [phone]  If 7PM-7AM, please contact night-coverage [email or web address] Password [email or web address]  01/15/2016, 2:18 AM

## 2016-01-15 NOTE — Progress Notes (Signed)
OT Cancellation Note  Patient Details Name: [name] [name]. MRN: [id number] DOB: [birth year]   Cancelled Treatment:    Reason Eval/Treat Not Completed: OT screened, no needs identified, will sign off. Pt has been signed off independent in his room and PT reports pt is at his baseline and has no acute therapy or DME needs.  Nils Pyle, OTR/L Pager: [phone] 01/15/2016, 11:53 AM

## 2016-01-15 NOTE — Evaluation (Signed)
Speech Language Pathology Evaluation Patient Details Name: [name] [name]. MRN: [id number] DOB: 1966/02/25 Today's Date: 01/15/2016 Time: 7062-3762 SLP Time Calculation (min) (ACUTE ONLY): 25 min  Problem List:  Patient Active Problem List   Diagnosis Date Noted  . Cerebral thrombosis with cerebral infarction 01/15/2016  . Stroke-like symptoms 01/14/2016  . Acute right-sided weakness 01/14/2016  . AKI (acute kidney injury) (HCC) 01/14/2016  . Accelerated hypertension 01/14/2016  . Right sided weakness 01/14/2016   Past Medical History:  Past Medical History:  Diagnosis Date  . Allergy   . Hernia, umbilical   . Hyperlipidemia   . Hypertension    Past Surgical History:  Past Surgical History:  Procedure Laterality Date  . UVULOPALATOPHARYNGOPLASTY    . VARICOSE VEIN SURGERY     HPI:  50 y.o. gentleman with a history of HTN and HLD who feels that he was in his baseline state of health until approximately 6:30pm.  After working in his yard most of the day, he had taken a shower and was sitting down with a cigarette when he noticed coordination difficulties with his right hand.  He subsequently noticed right leg weakness, slurred speech with word finding difficulty, and "loss of control" of his right arm and leg.  He called a Network engineer, who took him to the ED at [facility] by private vehicle.  After triage assessment there, he was referred [facility] for urgent neurology evaluation;  MRI on 01/15/16 indicated Acute subcentimeter infarct in the left pons  Assessment / Plan / Recommendation Clinical Impression   Pt appears to be functioning within normal limits with language, speech and cognitive skills at this time; [facility] Cognitive Assessment) revealed 27/30 which is within the normal range for cognitive skills; pt feels all symptoms have resolved at this time.    SLP Assessment  Patient does not need any further Speech Language Pathology Services    Follow Up Recommendations   None    Frequency and Duration   n/a        SLP Evaluation Prior Functioning  Cognitive/Linguistic Baseline: Within functional limits Type of Home: House  Lives With: Family Available Help at Discharge: Family;Friend(s);Available PRN/intermittently Education:  Chief Strategy Officer; works as Scientist, physiological ) Vocation: Full time employment   Cognition  Overall Cognitive Status: Within Functional Limits for tasks assessed Arousal/Alertness: Awake/alert Orientation Level: Oriented X4 Memory: Appears intact Awareness: Appears intact Problem Solving: Appears intact Safety/Judgment: Appears intact    Comprehension  Auditory Comprehension Overall Auditory Comprehension: Appears within functional limits for tasks assessed Yes/No Questions: Within Functional Limits Commands: Within Functional Limits Conversation: Complex Visual Recognition/Discrimination Discrimination: Within Function Limits Reading Comprehension Reading Status: Within funtional limits    Expression Expression Primary Mode of Expression: Verbal Verbal Expression Overall Verbal Expression: Appears within functional limits for tasks assessed Initiation: No impairment Level of Generative/Spontaneous Verbalization: Conversation Repetition: No impairment Naming: No impairment Pragmatics: No impairment Non-Verbal Means of Communication: Not applicable Written Expression Dominant Hand: Right Written Expression: Not tested   Oral / Motor  Oral Motor/Sensory Function Overall Oral Motor/Sensory Function: Within functional limits Motor Speech Overall Motor Speech: Appears within functional limits for tasks assessed Respiration: Within functional limits Phonation: Normal Resonance: Within functional limits Articulation: Within functional limitis Intelligibility: Intelligible Motor Planning: Witnin functional limits Motor Speech Errors: Not applicable                       [name],PAT, M.S,  CCC-SLP 01/15/2016, 11:57 AM

## 2016-01-15 NOTE — Progress Notes (Signed)
PROGRESS NOTE    [name] [name].  QMG:[id number] DOB: 06-18-1965 DOA: 01/14/2016 PCP: No primary care provider on file.    Brief Narrative: Acute CVA. 50 yo male with HTN and dyslipidemia. Presents with right sided weakness. MRI showed acute subcentimeter infract on the left pons. Clinically improved, no tpa given.     Assessment & Plan:   Principal Problem:   Stroke-like symptoms Active Problems:   Acute right-sided weakness   AKI (acute kidney injury) (HCC)   Accelerated hypertension   Right sided weakness   Cerebral thrombosis with cerebral infarction   1. Acute CVA. 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 Follow on echocardiogram, CT angiography with no significant stenosis. Will continue with antiplatelet therapy with asa plus statin therapy. Will follow on neurology and physical therapy recommendations.  2. HTN. Blood pressure 140 to 160, will allow permissive htn due to recent ischemic cva, will contineu to hold on antihypertensive medications and use hydralazine only as needed.  3. Dyslipidemia. Continue statin therapy.   4. CKD. Cr at 1.3 to 1,4 c/w with ckd stage II with GFR of 63. K at 3.3. Will follow renal panel in am, will avoid nephrotoxic medications and hypotension.    DVT prophylaxis: out of bed Code Status:  full Family Communication: no family at bedside Disposition Plan: home   Consultants:   neurology  Procedures:   Antimicrobials:    Subjective: Patient feeling better, recovered strength on the right side, no nausea or vomiting, no chest pain or dyspnea, tolerating po well.   Objective: Vitals:   01/15/16 0010 01/15/16 0208 01/15/16 0410 01/15/16 0555  BP: (!) 169/96 (!) 176/112 138/63 (!) 159/78  Pulse: 78 69 64 62  Resp: 20 20 18 20   Temp: 98.2 F (36.8 C) 98.4 F (36.9 C) 97.9 F (36.6 C) 98.1 F (36.7 C)  TempSrc: Oral Oral Oral Oral  SpO2: 97% 98% 98% 97%  Weight: 117.9 kg (260 lb)     Height: 5\' 10"  (1.778 m)       No intake or output data in the 24 hours ending 01/15/16 0929 Filed Weights   01/14/16 1935 01/15/16 0010  Weight: 117.9 kg (260 lb) 117.9 kg (260 lb)    Examination:  General exam: not in pain or dyspnea E ENT: no conjunctival pallor or icterus, oral mucosa moist. Respiratory system: Clear to auscultation. Respiratory effort normal. No wheezing, rales or rhonchi. Cardiovascular system: S1 & S2 heard, RRR. No JVD, murmurs, rubs, gallops or clicks. No pedal edema. Gastrointestinal system: Abdomen is nondistended, soft and nontender. No organomegaly or masses felt. Normal bowel sounds heard. Central nervous system: Alert and oriented. No focal neurological deficits. Extremities: Symmetric 5 x 5 power. Skin: No rashes, lesions or ulcers  Data Reviewed: I have personally reviewed following labs and imaging studies  CBC:  Recent Labs Lab 01/14/16 1954 01/14/16 2005 01/14/16 2056  WBC 10.8*  --   --   NEUTROABS 7.8*  --   --   HGB 17.5* 17.3* 16.7  HCT 50.5 51.0 49.0  MCV 92.8  --   --   PLT 178  --   --    Basic Metabolic Panel:  Recent Labs Lab 01/14/16 1954 01/14/16 2005 01/14/16 2056  NA 139 142 143  K 4.2 4.1 3.3*  CL 103 104 104  CO2 22  --   --   GLUCOSE 158* 156* 173*  BUN 29* 38* 28*  CREATININE 1.53* 1.40* 1.30*  CALCIUM 9.3  --   --  GFR: Estimated Creatinine Clearance: 86.5 mL/min (by C-G formula based on SCr of 1.3 mg/dL). Liver Function Tests:  Recent Labs Lab 01/14/16 1954  AST 48*  ALT 29  ALKPHOS 70  BILITOT 1.7*  PROT 7.7  ALBUMIN 4.4   No results for input(s): LIPASE, AMYLASE in the last 168 hours. No results for input(s): AMMONIA in the last 168 hours. Coagulation Profile:  Recent Labs Lab 01/14/16 1954  INR 0.91   Cardiac Enzymes: No results for input(s): CKTOTAL, CKMB, CKMBINDEX, TROPONINI in the last 168 hours. BNP (last 3 results) No results for input(s): PROBNP in the last 8760 hours. HbA1C: No results for input(s):  HGBA1C in the last 72 hours. CBG:  Recent Labs Lab 01/14/16 1953 01/14/16 2050  GLUCAP 171* 184*   Lipid Profile:  Recent Labs  01/15/16 0543  CHOL 139  HDL 37*  LDLCALC 77  TRIG 130  CHOLHDL 3.8   Thyroid Function Tests: No results for input(s): TSH, T4TOTAL, FREET4, T3FREE, THYROIDAB in the last 72 hours. Anemia Panel: No results for input(s): VITAMINB12, FOLATE, FERRITIN, TIBC, IRON, RETICCTPCT in the last 72 hours. Sepsis Labs: No results for input(s): PROCALCITON, LATICACIDVEN in the last 168 hours.  No results found for this or any previous visit (from the past 240 hour(s)).       Radiology Studies: Ct Angio Head W Or Wo Contrast  Result Date: 01/14/2016 CLINICAL DATA:  Stroke.  Right-sided weakness. EXAM: CT ANGIOGRAPHY HEAD AND NECK TECHNIQUE: Multidetector CT imaging of the head and neck was performed using the standard protocol during bolus administration of intravenous contrast. Multiplanar CT image reconstructions and MIPs were obtained to evaluate the vascular anatomy. Carotid stenosis measurements (when applicable) are obtained utilizing NASCET criteria, using the distal internal carotid diameter as the denominator. CONTRAST:  50 mL Isovue 370 COMPARISON:  None. FINDINGS: CTA NECK Aortic arch: 3 vessel aortic arch. Brachiocephalic and subclavian arteries are widely patent. Right carotid system: Mild-to-moderate mixed calcified and noncalcified plaque at the carotid bifurcation and in the proximal ICA without significant stenosis. Left carotid system: Mild plaque scattered in the proximal and mid common carotid artery without stenosis. Mild-to-moderate predominantly noncalcified plaque at the carotid bifurcation and in the proximal ICA without stenosis. Vertebral arteries:The vertebral arteries are patent and codominant. No significant stenosis is identified, although evaluation of the proximal vertebral arteries is limited in some areas by quantum mottle from patient  body habitus. Skeleton: Mild cervical spondylosis. Other neck: No neck mass. Upper chest: Grossly clear lung apices. CTA HEAD Anterior circulation: The internal carotid arteries are patent from skullbase to carotid termini without stenosis. ACAs and MCAs are patent without evidence of major branch occlusion or significant proximal stenosis. The right A1 segment is dominant. There is a diminished number of the distal left MCA branch vessels compared to the right. No intracranial aneurysm is identified. Posterior circulation: The intracranial vertebral arteries are patent to the basilar without significant stenosis. Left PICA origin is patent. Right PICA is not identified. SCA origins are grossly patent. Basilar artery is patent without stenosis. There is a fetal type origin of the left PCA. No significant proximal PCA stenosis is seen. Venous sinuses: Patent.  Hypoplastic left transverse sinus. Anatomic variants: Fetal type origin of the left PCA. IMPRESSION: 1. No emergent large vessel occlusion or flow limiting proximal stenosis. 2. Decreased number of  distal left MCA branch vessels. 3. Mild cervical carotid artery atherosclerosis without significant stenosis. Preliminary findings of no large vessel occlusion reviewed in person  with Dr. Amada Jupiter on 01/14/2016 at 9:43 p.m. Electronically Signed   By: Sebastian Ache M.D.   On: 01/14/2016 22:27   Ct Angio Neck W And/or Wo Contrast  Result Date: 01/14/2016 CLINICAL DATA:  Stroke.  Right-sided weakness. EXAM: CT ANGIOGRAPHY HEAD AND NECK TECHNIQUE: Multidetector CT imaging of the head and neck was performed using the standard protocol during bolus administration of intravenous contrast. Multiplanar CT image reconstructions and MIPs were obtained to evaluate the vascular anatomy. Carotid stenosis measurements (when applicable) are obtained utilizing NASCET criteria, using the distal internal carotid diameter as the denominator. CONTRAST:  50 mL Isovue 370  COMPARISON:  None. FINDINGS: CTA NECK Aortic arch: 3 vessel aortic arch. Brachiocephalic and subclavian arteries are widely patent. Right carotid system: Mild-to-moderate mixed calcified and noncalcified plaque at the carotid bifurcation and in the proximal ICA without significant stenosis. Left carotid system: Mild plaque scattered in the proximal and mid common carotid artery without stenosis. Mild-to-moderate predominantly noncalcified plaque at the carotid bifurcation and in the proximal ICA without stenosis. Vertebral arteries:The vertebral arteries are patent and codominant. No significant stenosis is identified, although evaluation of the proximal vertebral arteries is limited in some areas by quantum mottle from patient body habitus. Skeleton: Mild cervical spondylosis. Other neck: No neck mass. Upper chest: Grossly clear lung apices. CTA HEAD Anterior circulation: The internal carotid arteries are patent from skullbase to carotid termini without stenosis. ACAs and MCAs are patent without evidence of major branch occlusion or significant proximal stenosis. The right A1 segment is dominant. There is a diminished number of the distal left MCA branch vessels compared to the right. No intracranial aneurysm is identified. Posterior circulation: The intracranial vertebral arteries are patent to the basilar without significant stenosis. Left PICA origin is patent. Right PICA is not identified. SCA origins are grossly patent. Basilar artery is patent without stenosis. There is a fetal type origin of the left PCA. No significant proximal PCA stenosis is seen. Venous sinuses: Patent.  Hypoplastic left transverse sinus. Anatomic variants: Fetal type origin of the left PCA. IMPRESSION: 1. No emergent large vessel occlusion or flow limiting proximal stenosis. 2. Decreased number of  distal left MCA branch vessels. 3. Mild cervical carotid artery atherosclerosis without significant stenosis. Preliminary findings of no  large vessel occlusion reviewed in person with Dr. Amada Jupiter on 01/14/2016 at 9:43 p.m. Electronically Signed   By: Sebastian Ache M.D.   On: 01/14/2016 22:27   Mr Brain Wo Contrast  Result Date: 01/15/2016 CLINICAL DATA:  Right-sided arm and leg weakness and slurred speech. EXAM: MRI HEAD WITHOUT CONTRAST TECHNIQUE: Multiplanar, multiecho pulse sequences of the brain and surrounding structures were obtained without intravenous contrast. COMPARISON:  Head CT 01/14/2016 FINDINGS: There is a 7 mm acute infarct inferiorly in the pons on the left. There is no evidence intracranial hemorrhage, mass, midline shift, or extra-axial fluid collection. Ventricles and sulci are normal. Patchy to confluent T2 hyperintensities are present in the cerebral white matter and pons, nonspecific but compatible with age advanced, extensive small vessel ischemic disease in this patient with hypertension and hyperlipidemia. There is a chronic lacunar infarct at the posterior aspect of the left middle cerebellar peduncle. There is no evidence of infarct in the thalamus, and the small area of abnormal density on CT was likely artifactual. Orbits are unremarkable. Mild mucosal thickening is present in the left frontal, left ethmoid, and right maxillary sinuses. Mastoid air cells are clear. Major intracranial vascular flow voids are preserved. No focal osseous  lesion is identified. IMPRESSION: 1. Acute subcentimeter infarct in the left pons. 2. Extensive chronic small vessel ischemic disease. Electronically Signed   By: Sebastian Ache M.D.   On: 01/15/2016 07:18   Ct Head Code Stroke W/o Cm  Result Date: 01/14/2016 CLINICAL DATA:  Code stroke. Left-sided numbness. Slurred speech. History of hypertension and hyperlipidemia. EXAM: CT HEAD WITHOUT CONTRAST TECHNIQUE: Contiguous axial images were obtained from the base of the skull through the vertex without intravenous contrast. COMPARISON:  None. FINDINGS: There is no evidence of acute  cortical infarct, intracranial hemorrhage, mass, midline shift, or extra-axial fluid collection. The ventricles and sulci are normal. There are extensive, patchy and confluent hypodensities in the cerebral white matter bilaterally. There is a 7 mm focus of mild hypoattenuation in the right thalamus suggestive of a lacunar infarct and of indeterminate age. Orbits are unremarkable. Mild right maxillary sinus mucosal thickening is partially visualized. The mastoid air cells are clear. No acute osseous abnormality is identified. ASPECTS [facility] Stroke Program Early CT Score, http://[email or web address].[email or web address]) - Ganglionic level infarction (caudate, lentiform nuclei, internal capsule, insula, M1-M3 cortex): 7 - Supraganglionic infarction (M4-M6 cortex): 3 Total score (0-10 with 10 being normal): 10 IMPRESSION: 1. No evidence of acute intracranial hemorrhage or acute cortical infarct. 2. Age indeterminate lacunar infarct in the right thalamus, possibly recent. 3. ASPECTS score 10. 4. Extensive cerebral white matter disease, markedly advanced for age and nonspecific but may reflect chronic small vessel ischemia. These results were called by telephone at the time of interpretation on 01/14/2016 at 8:13 pm to Dr. Dalene Seltzer, who verbally acknowledged these results. Electronically Signed   By: Sebastian Ache M.D.   On: 01/14/2016 20:16        Scheduled Meds: . aspirin  300 mg Rectal Daily   Or  . aspirin  325 mg Oral Daily   Continuous Infusions: . sodium chloride 75 mL/hr at 01/15/16 0104     LOS: 0 days        Coralie Keens, MD [facility] Pager [phone]  If 7PM-7AM, please contact night-coverage [email or web address] Password [email or web address] 01/15/2016, 9:29 AM

## 2016-01-15 NOTE — Progress Notes (Signed)
Patient arrived to 5M16 AAOx4 and able to walk to the bed with no issues. Vitals taken and tele placed. Will continue to monitor. Zaeden Lastinger, [place] Scrape, RN

## 2016-01-16 ENCOUNTER — Inpatient Hospital Stay (HOSPITAL_COMMUNITY): Payer: Managed Care, Other (non HMO)

## 2016-01-16 DIAGNOSIS — F17200 Nicotine dependence, unspecified, uncomplicated: Secondary | ICD-10-CM

## 2016-01-16 DIAGNOSIS — I6789 Other cerebrovascular disease: Secondary | ICD-10-CM

## 2016-01-16 LAB — BASIC METABOLIC PANEL
Anion gap: 8 (ref 5–15)
BUN: 21 mg/dL — AB (ref 6–20)
CO2: 24 mmol/L (ref 22–32)
Calcium: 8.9 mg/dL (ref 8.9–10.3)
Chloride: 106 mmol/L (ref 101–111)
Creatinine, Ser: 0.89 mg/dL (ref 0.61–1.24)
Glucose, Bld: 178 mg/dL — ABNORMAL HIGH (ref 65–99)
POTASSIUM: 3.5 mmol/L (ref 3.5–5.1)
SODIUM: 138 mmol/L (ref 135–145)

## 2016-01-16 LAB — ECHOCARDIOGRAM COMPLETE
HEIGHTINCHES: 70 in
WEIGHTICAEL: 4160 [oz_av]

## 2016-01-16 MED ORDER — ASPIRIN 325 MG PO TABS: 325.0000 mg | ORAL_TABLET | Freq: Every day | ORAL | 0 refills | Status: AC

## 2016-01-16 MED ORDER — ATORVASTATIN CALCIUM 20 MG PO TABS: 20.0000 mg | ORAL_TABLET | Freq: Every day | ORAL | 0 refills | Status: DC

## 2016-01-16 NOTE — Discharge Summary (Signed)
Physician Discharge Summary  [name] [name]. ZOX:[id number] DOB: 1965-10-23 DOA: 01/14/2016  PCP: No primary care provider on file.  Admit date: 01/14/2016 Discharge date: 01/16/2016  Admitted From: Home Disposition:  Home  Recommendations for Outpatient Follow-up:  1. Follow up with PCP in 1-2 weeks 2. Echocardiogram report pending.  Home Health: no Equipment/Devices: no  Discharge Condition: Stable CODE STATUS: Full Diet recommendation: Heart Healthy  Brief/Interim Summary: This is a 50 year old gentleman who presented to the hospital with the chief complaint of right-sided weakness. On the day of admission after working in his yard, he felt abruptly right-sided weakness, slurred speech with difficulty finding words. On his initial physical examination his blood pressure was 171/120, his heart rate was 75, respirations 15, oxygen saturation 97%. His mucous membranes were moist, his lungs were clear to auscultation, heart S1-S2 present and rhythmic, his abdomen was soft, lower extremities no edema, and alerts 2 to 12 grossly intact, noted mild residual right-sided weakness. His head CT showed no evidence of acute intracranial findings, age indeterminate right thalamic lacunar infarct. His EKG showed sinus rhythm, left atrial enlargement, left axis deviation with a left anterior fascicular block and a right bundle branch block.   Patient was admitted to the hospital with working diagnosis of acute focal neurologic deficit to rule out acute CVA.   1. Acute CVA/ Acute subcentimeter infarct in the left pons . Further workup included a MRI which showed acute subcentimeter infarct in the left pons. CT angiography of head and neck showed no significant stenosis. Neurology was consulted, recommendations to continue antibiotic therapy with aspirin 325 mg and start atorvastatin 20 mg. Patient was evaluated by physical therapy. Hs  focal neurologic deficit improved significantly  while he was in  hospital. Echocardiogram result is pending. Lipid profile showing LDL 77 with an HDL of 37.  2. Hypertension. Patient's antihypertensive agents were held to avoid hypotension. His systolic blood pressure has been ranging between 160 and 180. By the time of discharge he'll resume taking lisinopril/ hydrochlorothiazide.  3. Chronic kidney disease. Patient's creatinine has remained between 1.3 and 1.4, consistent with chronic kidney disease stage II.  4. Tobacco abuse. Smoking cessation.   Discharge Diagnoses:  Principal Problem:   Stroke-like symptoms Active Problems:   Acute right-sided weakness   AKI (acute kidney injury) (HCC)   Accelerated hypertension   Right sided weakness   Cerebral infarction due to unspecified mechanism   Cerebrovascular accident (CVA) due to thrombosis of basilar artery (HCC)   HLD (hyperlipidemia)   Tobacco use disorder    Discharge Instructions  Discharge Instructions    Ambulatory referral to Neurology    Complete by:  As directed   Follow up with NP Darrol Angel at [facility] in about two months. Thanks.   Diet - low sodium heart healthy    Complete by:  As directed   Discharge instructions    Complete by:  As directed   Please follow with neurology and primary care.   Increase activity slowly    Complete by:  As directed       Medication List    TAKE these medications   aspirin 325 MG tablet Take 1 tablet (325 mg total) by mouth daily.   atorvastatin 20 MG tablet Commonly known as:  LIPITOR Take 1 tablet (20 mg total) by mouth daily at 6 PM.   fluticasone 50 MCG/ACT nasal spray Commonly known as:  FLONASE Place 1-2 sprays into both nostrils daily as needed for rhinitis.  lisinopril-hydrochlorothiazide 20-12.5 MG tablet Commonly known as:  ZESTORETIC Take 1 tablet by mouth daily.      Follow-up Information    Nilda Riggs, NP. Schedule an appointment as soon as possible for a visit in 2 month(s).   Specialty:  Family  Medicine Contact information: [address] [place] [address] [phone]          No Known Allergies  Consultations:  Neurology   Procedures/Studies: Ct Angio Head W Or Wo Contrast  Result Date: 01/14/2016 CLINICAL DATA:  Stroke.  Right-sided weakness. EXAM: CT ANGIOGRAPHY HEAD AND NECK TECHNIQUE: Multidetector CT imaging of the head and neck was performed using the standard protocol during bolus administration of intravenous contrast. Multiplanar CT image reconstructions and MIPs were obtained to evaluate the vascular anatomy. Carotid stenosis measurements (when applicable) are obtained utilizing NASCET criteria, using the distal internal carotid diameter as the denominator. CONTRAST:  50 mL Isovue 370 COMPARISON:  None. FINDINGS: CTA NECK Aortic arch: 3 vessel aortic arch. Brachiocephalic and subclavian arteries are widely patent. Right carotid system: Mild-to-moderate mixed calcified and noncalcified plaque at the carotid bifurcation and in the proximal ICA without significant stenosis. Left carotid system: Mild plaque scattered in the proximal and mid common carotid artery without stenosis. Mild-to-moderate predominantly noncalcified plaque at the carotid bifurcation and in the proximal ICA without stenosis. Vertebral arteries:The vertebral arteries are patent and codominant. No significant stenosis is identified, although evaluation of the proximal vertebral arteries is limited in some areas by quantum mottle from patient body habitus. Skeleton: Mild cervical spondylosis. Other neck: No neck mass. Upper chest: Grossly clear lung apices. CTA HEAD Anterior circulation: The internal carotid arteries are patent from skullbase to carotid termini without stenosis. ACAs and MCAs are patent without evidence of major branch occlusion or significant proximal stenosis. The right A1 segment is dominant. There is a diminished number of the distal left MCA branch vessels compared to the  right. No intracranial aneurysm is identified. Posterior circulation: The intracranial vertebral arteries are patent to the basilar without significant stenosis. Left PICA origin is patent. Right PICA is not identified. SCA origins are grossly patent. Basilar artery is patent without stenosis. There is a fetal type origin of the left PCA. No significant proximal PCA stenosis is seen. Venous sinuses: Patent.  Hypoplastic left transverse sinus. Anatomic variants: Fetal type origin of the left PCA. IMPRESSION: 1. No emergent large vessel occlusion or flow limiting proximal stenosis. 2. Decreased number of  distal left MCA branch vessels. 3. Mild cervical carotid artery atherosclerosis without significant stenosis. Preliminary findings of no large vessel occlusion reviewed in person with Dr. Amada Jupiter on 01/14/2016 at 9:43 p.m. Electronically Signed   By: Sebastian Ache M.D.   On: 01/14/2016 22:27   Ct Angio Neck W And/or Wo Contrast  Result Date: 01/14/2016 CLINICAL DATA:  Stroke.  Right-sided weakness. EXAM: CT ANGIOGRAPHY HEAD AND NECK TECHNIQUE: Multidetector CT imaging of the head and neck was performed using the standard protocol during bolus administration of intravenous contrast. Multiplanar CT image reconstructions and MIPs were obtained to evaluate the vascular anatomy. Carotid stenosis measurements (when applicable) are obtained utilizing NASCET criteria, using the distal internal carotid diameter as the denominator. CONTRAST:  50 mL Isovue 370 COMPARISON:  None. FINDINGS: CTA NECK Aortic arch: 3 vessel aortic arch. Brachiocephalic and subclavian arteries are widely patent. Right carotid system: Mild-to-moderate mixed calcified and noncalcified plaque at the carotid bifurcation and in the proximal ICA without significant stenosis. Left carotid system: Mild  plaque scattered in the proximal and mid common carotid artery without stenosis. Mild-to-moderate predominantly noncalcified plaque at the carotid  bifurcation and in the proximal ICA without stenosis. Vertebral arteries:The vertebral arteries are patent and codominant. No significant stenosis is identified, although evaluation of the proximal vertebral arteries is limited in some areas by quantum mottle from patient body habitus. Skeleton: Mild cervical spondylosis. Other neck: No neck mass. Upper chest: Grossly clear lung apices. CTA HEAD Anterior circulation: The internal carotid arteries are patent from skullbase to carotid termini without stenosis. ACAs and MCAs are patent without evidence of major branch occlusion or significant proximal stenosis. The right A1 segment is dominant. There is a diminished number of the distal left MCA branch vessels compared to the right. No intracranial aneurysm is identified. Posterior circulation: The intracranial vertebral arteries are patent to the basilar without significant stenosis. Left PICA origin is patent. Right PICA is not identified. SCA origins are grossly patent. Basilar artery is patent without stenosis. There is a fetal type origin of the left PCA. No significant proximal PCA stenosis is seen. Venous sinuses: Patent.  Hypoplastic left transverse sinus. Anatomic variants: Fetal type origin of the left PCA. IMPRESSION: 1. No emergent large vessel occlusion or flow limiting proximal stenosis. 2. Decreased number of  distal left MCA branch vessels. 3. Mild cervical carotid artery atherosclerosis without significant stenosis. Preliminary findings of no large vessel occlusion reviewed in person with Dr. Amada Jupiter on 01/14/2016 at 9:43 p.m. Electronically Signed   By: Sebastian Ache M.D.   On: 01/14/2016 22:27   Mr Brain Wo Contrast  Result Date: 01/15/2016 CLINICAL DATA:  Right-sided arm and leg weakness and slurred speech. EXAM: MRI HEAD WITHOUT CONTRAST TECHNIQUE: Multiplanar, multiecho pulse sequences of the brain and surrounding structures were obtained without intravenous contrast. COMPARISON:  Head CT  01/14/2016 FINDINGS: There is a 7 mm acute infarct inferiorly in the pons on the left. There is no evidence intracranial hemorrhage, mass, midline shift, or extra-axial fluid collection. Ventricles and sulci are normal. Patchy to confluent T2 hyperintensities are present in the cerebral white matter and pons, nonspecific but compatible with age advanced, extensive small vessel ischemic disease in this patient with hypertension and hyperlipidemia. There is a chronic lacunar infarct at the posterior aspect of the left middle cerebellar peduncle. There is no evidence of infarct in the thalamus, and the small area of abnormal density on CT was likely artifactual. Orbits are unremarkable. Mild mucosal thickening is present in the left frontal, left ethmoid, and right maxillary sinuses. Mastoid air cells are clear. Major intracranial vascular flow voids are preserved. No focal osseous lesion is identified. IMPRESSION: 1. Acute subcentimeter infarct in the left pons. 2. Extensive chronic small vessel ischemic disease. Electronically Signed   By: Sebastian Ache M.D.   On: 01/15/2016 07:18   Ct Head Code Stroke W/o Cm  Result Date: 01/14/2016 CLINICAL DATA:  Code stroke. Left-sided numbness. Slurred speech. History of hypertension and hyperlipidemia. EXAM: CT HEAD WITHOUT CONTRAST TECHNIQUE: Contiguous axial images were obtained from the base of the skull through the vertex without intravenous contrast. COMPARISON:  None. FINDINGS: There is no evidence of acute cortical infarct, intracranial hemorrhage, mass, midline shift, or extra-axial fluid collection. The ventricles and sulci are normal. There are extensive, patchy and confluent hypodensities in the cerebral white matter bilaterally. There is a 7 mm focus of mild hypoattenuation in the right thalamus suggestive of a lacunar infarct and of indeterminate age. Orbits are unremarkable. Mild right maxillary sinus  mucosal thickening is partially visualized. The mastoid air  cells are clear. No acute osseous abnormality is identified. ASPECTS [facility] Stroke Program Early CT Score, http://[email or web address].[email or web address]) - Ganglionic level infarction (caudate, lentiform nuclei, internal capsule, insula, M1-M3 cortex): 7 - Supraganglionic infarction (M4-M6 cortex): 3 Total score (0-10 with 10 being normal): 10 IMPRESSION: 1. No evidence of acute intracranial hemorrhage or acute cortical infarct. 2. Age indeterminate lacunar infarct in the right thalamus, possibly recent. 3. ASPECTS score 10. 4. Extensive cerebral white matter disease, markedly advanced for age and nonspecific but may reflect chronic small vessel ischemia. These results were called by telephone at the time of interpretation on 01/14/2016 at 8:13 pm to Dr. Dalene Seltzer, who verbally acknowledged these results. Electronically Signed   By: Sebastian Ache M.D.   On: 01/14/2016 20:16       Subjective: Patient is feeling better, he has been ambulating, denies any shortness of breath or chest pain. Right-sided weakness has significantly improved.  Discharge Exam: Vitals:   01/16/16 0230 01/16/16 0424  BP: (!) 184/105 (!) 130/50  Pulse:  68  Resp:  18  Temp:  98.6 F (37 C)   Vitals:   01/15/16 2138 01/16/16 0139 01/16/16 0230 01/16/16 0424  BP: (!) 150/79 (!) 188/121 (!) 184/105 (!) 130/50  Pulse: 64 67  68  Resp: 16 18  18   Temp: 98.4 F (36.9 C) 98 F (36.7 C)  98.6 F (37 C)  TempSrc: Oral Oral  Oral  SpO2: 98% 98%  98%  Weight:      Height:        General: Pt is alert, awake, not in acute distress Cardiovascular: RRR, S1/S2 +, no rubs, no gallops Respiratory: CTA bilaterally, no wheezing, no rhonchi Abdominal: Soft, NT, ND, bowel sounds + Extremities: no edema, no cyanosis Neurology. Bilateral upper extremity strength is 5 out of 5, lower extremities he has very discrete weakness on the right side.    The results of significant diagnostics from this hospitalization (including imaging, microbiology,  ancillary and laboratory) are listed below for reference.     Microbiology: No results found for this or any previous visit (from the past 240 hour(s)).   Labs: BNP (last 3 results) No results for input(s): BNP in the last 8760 hours. Basic Metabolic Panel:  Recent Labs Lab 01/14/16 1954 01/14/16 2005 01/14/16 2056 01/16/16 0714  NA 139 142 143 138  K 4.2 4.1 3.3* 3.5  CL 103 104 104 106  CO2 22  --   --  24  GLUCOSE 158* 156* 173* 178*  BUN 29* 38* 28* 21*  CREATININE 1.53* 1.40* 1.30* 0.89  CALCIUM 9.3  --   --  8.9   Liver Function Tests:  Recent Labs Lab 01/14/16 1954  AST 48*  ALT 29  ALKPHOS 70  BILITOT 1.7*  PROT 7.7  ALBUMIN 4.4   No results for input(s): LIPASE, AMYLASE in the last 168 hours. No results for input(s): AMMONIA in the last 168 hours. CBC:  Recent Labs Lab 01/14/16 1954 01/14/16 2005 01/14/16 2056  WBC 10.8*  --   --   NEUTROABS 7.8*  --   --   HGB 17.5* 17.3* 16.7  HCT 50.5 51.0 49.0  MCV 92.8  --   --   PLT 178  --   --    Cardiac Enzymes: No results for input(s): CKTOTAL, CKMB, CKMBINDEX, TROPONINI in the last 168 hours. BNP: Invalid input(s): POCBNP CBG:  Recent Labs Lab 01/14/16 1953 01/14/16 2050  GLUCAP 171* 184*   D-Dimer No results for input(s): DDIMER in the last 72 hours. Hgb A1c No results for input(s): HGBA1C in the last 72 hours. Lipid Profile  Recent Labs  01/15/16 0543  CHOL 139  HDL 37*  LDLCALC 77  TRIG 161127  CHOLHDL 3.8   Thyroid function studies No results for input(s): TSH, T4TOTAL, T3FREE, THYROIDAB in the last 72 hours.  Invalid input(s): FREET3 Anemia work up No results for input(s): VITAMINB12, FOLATE, FERRITIN, TIBC, IRON, RETICCTPCT in the last 72 hours. Urinalysis    Component Value Date/Time   COLORURINE AMBER (A) 01/14/2016 2038   APPEARANCEUR CLEAR 01/14/2016 2038   LABSPEC 1.045 (H) 01/14/2016 2038   PHURINE 5.0 01/14/2016 2038   GLUCOSEU NEGATIVE 01/14/2016 2038   HGBUR  NEGATIVE 01/14/2016 2038   BILIRUBINUR SMALL (A) 01/14/2016 2038   BILIRUBINUR neg 04/17/2014 1224   KETONESUR 15 (A) 01/14/2016 2038   PROTEINUR 100 (A) 01/14/2016 2038   UROBILINOGEN 0.2 04/17/2014 1224   NITRITE NEGATIVE 01/14/2016 2038   LEUKOCYTESUR NEGATIVE 01/14/2016 2038   Sepsis Labs Invalid input(s): PROCALCITONIN,  WBC,  LACTICIDVEN Microbiology No results found for this or any previous visit (from the past 240 hour(s)).   Time coordinating discharge: 45 minutes  SIGNED:   Coralie KeensMauricio Daniel Tomesha Sargent, MD  [facility] 01/16/2016, 9:40 AM Pager   If 7PM-7AM, please contact night-coverage [email or web address] Password [email or web address]

## 2016-01-16 NOTE — Progress Notes (Signed)
01/16/16 1105 nursing  Patient discharged to home per wheelchair accompanied by NT and mother. Discharge instructions given no further questions. Per patient he received his educational materials.

## 2016-01-16 NOTE — Progress Notes (Signed)
Echocardiogram 2D Echocardiogram has been performed.  [name] [name] 01/16/2016, 8:45 AM

## 2016-01-17 ENCOUNTER — Inpatient Hospital Stay (HOSPITAL_COMMUNITY)
Admission: EM | Admit: 2016-01-17 | Discharge: 2016-01-20 | DRG: 057 | Disposition: A | Discharge status: Rehabilitation Facility | Payer: Managed Care, Other (non HMO) | Attending: Internal Medicine | Admitting: Internal Medicine

## 2016-01-17 ENCOUNTER — Encounter (HOSPITAL_COMMUNITY): Payer: Self-pay

## 2016-01-17 ENCOUNTER — Inpatient Hospital Stay (HOSPITAL_COMMUNITY): Payer: Managed Care, Other (non HMO)

## 2016-01-17 ENCOUNTER — Emergency Department (HOSPITAL_COMMUNITY): Payer: Managed Care, Other (non HMO)

## 2016-01-17 DIAGNOSIS — E785 Hyperlipidemia, unspecified: Secondary | ICD-10-CM | POA: Diagnosis present

## 2016-01-17 DIAGNOSIS — Z79899 Other long term (current) drug therapy: Secondary | ICD-10-CM | POA: Diagnosis not present

## 2016-01-17 DIAGNOSIS — I6302 Cerebral infarction due to thrombosis of basilar artery: Secondary | ICD-10-CM | POA: Diagnosis present

## 2016-01-17 DIAGNOSIS — I639 Cerebral infarction, unspecified: Secondary | ICD-10-CM | POA: Diagnosis present

## 2016-01-17 DIAGNOSIS — Z7982 Long term (current) use of aspirin: Secondary | ICD-10-CM | POA: Diagnosis not present

## 2016-01-17 DIAGNOSIS — E860 Dehydration: Secondary | ICD-10-CM | POA: Diagnosis present

## 2016-01-17 DIAGNOSIS — E119 Type 2 diabetes mellitus without complications: Secondary | ICD-10-CM | POA: Diagnosis not present

## 2016-01-17 DIAGNOSIS — E1122 Type 2 diabetes mellitus with diabetic chronic kidney disease: Secondary | ICD-10-CM | POA: Diagnosis present

## 2016-01-17 DIAGNOSIS — Z885 Allergy status to narcotic agent status: Secondary | ICD-10-CM

## 2016-01-17 DIAGNOSIS — R29810 Facial weakness: Secondary | ICD-10-CM | POA: Diagnosis present

## 2016-01-17 DIAGNOSIS — K5900 Constipation, unspecified: Secondary | ICD-10-CM | POA: Diagnosis present

## 2016-01-17 DIAGNOSIS — I6340 Cerebral infarction due to embolism of unspecified cerebral artery: Secondary | ICD-10-CM

## 2016-01-17 DIAGNOSIS — I6330 Cerebral infarction due to thrombosis of unspecified cerebral artery: Secondary | ICD-10-CM | POA: Diagnosis not present

## 2016-01-17 DIAGNOSIS — Z6837 Body mass index (BMI) 37.0-37.9, adult: Secondary | ICD-10-CM | POA: Diagnosis not present

## 2016-01-17 DIAGNOSIS — E669 Obesity, unspecified: Secondary | ICD-10-CM | POA: Diagnosis present

## 2016-01-17 DIAGNOSIS — Z8673 Personal history of transient ischemic attack (TIA), and cerebral infarction without residual deficits: Secondary | ICD-10-CM | POA: Diagnosis not present

## 2016-01-17 DIAGNOSIS — N189 Chronic kidney disease, unspecified: Secondary | ICD-10-CM | POA: Diagnosis present

## 2016-01-17 DIAGNOSIS — R2990 Unspecified symptoms and signs involving the nervous system: Secondary | ICD-10-CM | POA: Diagnosis present

## 2016-01-17 DIAGNOSIS — Z23 Encounter for immunization: Secondary | ICD-10-CM | POA: Diagnosis not present

## 2016-01-17 DIAGNOSIS — E1159 Type 2 diabetes mellitus with other circulatory complications: Secondary | ICD-10-CM | POA: Diagnosis not present

## 2016-01-17 DIAGNOSIS — E1165 Type 2 diabetes mellitus with hyperglycemia: Secondary | ICD-10-CM | POA: Diagnosis not present

## 2016-01-17 DIAGNOSIS — G8191 Hemiplegia, unspecified affecting right dominant side: Secondary | ICD-10-CM | POA: Diagnosis present

## 2016-01-17 DIAGNOSIS — F17210 Nicotine dependence, cigarettes, uncomplicated: Secondary | ICD-10-CM | POA: Diagnosis present

## 2016-01-17 DIAGNOSIS — F17200 Nicotine dependence, unspecified, uncomplicated: Secondary | ICD-10-CM | POA: Diagnosis not present

## 2016-01-17 DIAGNOSIS — M21371 Foot drop, right foot: Secondary | ICD-10-CM | POA: Diagnosis not present

## 2016-01-17 DIAGNOSIS — I129 Hypertensive chronic kidney disease with stage 1 through stage 4 chronic kidney disease, or unspecified chronic kidney disease: Secondary | ICD-10-CM | POA: Diagnosis present

## 2016-01-17 DIAGNOSIS — M6289 Other specified disorders of muscle: Secondary | ICD-10-CM | POA: Diagnosis not present

## 2016-01-17 DIAGNOSIS — I6350 Cerebral infarction due to unspecified occlusion or stenosis of unspecified cerebral artery: Secondary | ICD-10-CM | POA: Diagnosis not present

## 2016-01-17 DIAGNOSIS — I10 Essential (primary) hypertension: Secondary | ICD-10-CM | POA: Diagnosis present

## 2016-01-17 DIAGNOSIS — R531 Weakness: Secondary | ICD-10-CM | POA: Diagnosis present

## 2016-01-17 LAB — PROTIME-INR
INR: 0.92
PROTHROMBIN TIME: 12.4 s (ref 11.4–15.2)

## 2016-01-17 LAB — CBC WITH DIFFERENTIAL/PLATELET
Basophils Absolute: 0 10*3/uL (ref 0.0–0.1)
Basophils Relative: 0 %
EOS ABS: 0.2 10*3/uL (ref 0.0–0.7)
EOS PCT: 3 %
HCT: 46.1 % (ref 39.0–52.0)
Hemoglobin: 15.3 g/dL (ref 13.0–17.0)
LYMPHS ABS: 1.6 10*3/uL (ref 0.7–4.0)
Lymphocytes Relative: 18 %
MCH: 31.5 pg (ref 26.0–34.0)
MCHC: 33.2 g/dL (ref 30.0–36.0)
MCV: 95.1 fL (ref 78.0–100.0)
MONO ABS: 0.6 10*3/uL (ref 0.1–1.0)
Monocytes Relative: 6 %
Neutro Abs: 6.6 10*3/uL (ref 1.7–7.7)
Neutrophils Relative %: 73 %
PLATELETS: 154 10*3/uL (ref 150–400)
RBC: 4.85 MIL/uL (ref 4.22–5.81)
RDW: 13.4 % (ref 11.5–15.5)
WBC: 9.1 10*3/uL (ref 4.0–10.5)

## 2016-01-17 LAB — COMPREHENSIVE METABOLIC PANEL
ALT: 26 U/L (ref 17–63)
ANION GAP: 8 (ref 5–15)
AST: 25 U/L (ref 15–41)
Albumin: 3.4 g/dL — ABNORMAL LOW (ref 3.5–5.0)
Alkaline Phosphatase: 55 U/L (ref 38–126)
BUN: 17 mg/dL (ref 6–20)
CHLORIDE: 105 mmol/L (ref 101–111)
CO2: 24 mmol/L (ref 22–32)
CREATININE: 0.77 mg/dL (ref 0.61–1.24)
Calcium: 8.7 mg/dL — ABNORMAL LOW (ref 8.9–10.3)
GFR calc non Af Amer: >60 mL/min (ref >60)
Glucose, Bld: 177 mg/dL — ABNORMAL HIGH (ref 65–99)
POTASSIUM: 3.6 mmol/L (ref 3.5–5.1)
SODIUM: 137 mmol/L (ref 135–145)
Total Bilirubin: 0.8 mg/dL (ref 0.3–1.2)
Total Protein: 6.2 g/dL — ABNORMAL LOW (ref 6.5–8.1)

## 2016-01-17 LAB — GLUCOSE, CAPILLARY
GLUCOSE-CAPILLARY: 215 mg/dL — AB (ref 65–99)
GLUCOSE-CAPILLARY: 103 mg/dL — AB (ref 65–99)

## 2016-01-17 LAB — APTT: aPTT: 25 seconds (ref 24–36)

## 2016-01-17 LAB — HEMOGLOBIN A1C
HEMOGLOBIN A1C: 8.5 % — AB (ref 4.8–5.6)
Mean Plasma Glucose: 197 mg/dL

## 2016-01-17 MED ORDER — ASPIRIN 325 MG PO TABS
325.0000 mg | ORAL_TABLET | Freq: Every day | ORAL | Status: DC
  Administered 2016-01-17 – 2016-01-20 (×4): 325 mg via ORAL
  Filled 2016-01-17 (×4): qty 1

## 2016-01-17 MED ORDER — INSULIN ASPART 100 UNIT/ML ~~LOC~~ SOLN
0.0000 [IU] | Freq: Three times a day (TID) | SUBCUTANEOUS | Status: DC
  Administered 2016-01-17: 3 [IU] via SUBCUTANEOUS
  Administered 2016-01-18: 2 [IU] via SUBCUTANEOUS
  Administered 2016-01-18: 5 [IU] via SUBCUTANEOUS
  Administered 2016-01-18: 1 [IU] via SUBCUTANEOUS
  Administered 2016-01-19: 2 [IU] via SUBCUTANEOUS
  Administered 2016-01-19 – 2016-01-20 (×2): 1 [IU] via SUBCUTANEOUS

## 2016-01-17 MED ORDER — ACETAMINOPHEN 325 MG PO TABS
650.0000 mg | ORAL_TABLET | ORAL | Status: DC | PRN
  Administered 2016-01-17 – 2016-01-19 (×3): 650 mg via ORAL
  Filled 2016-01-17 (×3): qty 2

## 2016-01-17 MED ORDER — HYDROCHLOROTHIAZIDE 12.5 MG PO CAPS
12.5000 mg | ORAL_CAPSULE | Freq: Every day | ORAL | Status: DC
  Administered 2016-01-18 – 2016-01-20 (×3): 12.5 mg via ORAL
  Filled 2016-01-17 (×3): qty 1

## 2016-01-17 MED ORDER — LISINOPRIL-HYDROCHLOROTHIAZIDE 20-12.5 MG PO TABS: 1.0000 | ORAL_TABLET | Freq: Every day | ORAL | Status: DC

## 2016-01-17 MED ORDER — HYDRALAZINE HCL 20 MG/ML IJ SOLN: 5.0000 mg | Freq: Three times a day (TID) | INTRAMUSCULAR | Status: DC | PRN

## 2016-01-17 MED ORDER — ACETAMINOPHEN 650 MG RE SUPP: 650.0000 mg | RECTAL | Status: DC | PRN

## 2016-01-17 MED ORDER — STROKE: EARLY STAGES OF RECOVERY BOOK
Freq: Once | Status: AC
  Administered 2016-01-17: 16:00:00
  Filled 2016-01-17: qty 1

## 2016-01-17 MED ORDER — NICOTINE 21 MG/24HR TD PT24
21.0000 mg | MEDICATED_PATCH | Freq: Every day | TRANSDERMAL | Status: DC
  Administered 2016-01-17 – 2016-01-20 (×4): 21 mg via TRANSDERMAL
  Filled 2016-01-17 (×4): qty 1

## 2016-01-17 MED ORDER — LISINOPRIL 20 MG PO TABS
20.0000 mg | ORAL_TABLET | Freq: Every day | ORAL | Status: DC
  Administered 2016-01-18 – 2016-01-20 (×3): 20 mg via ORAL
  Filled 2016-01-17 (×3): qty 1

## 2016-01-17 MED ORDER — ENOXAPARIN SODIUM 40 MG/0.4ML ~~LOC~~ SOLN
40.0000 mg | SUBCUTANEOUS | Status: DC
  Administered 2016-01-17 – 2016-01-19 (×3): 40 mg via SUBCUTANEOUS
  Filled 2016-01-17 (×4): qty 0.4

## 2016-01-17 MED ORDER — PNEUMOCOCCAL VAC POLYVALENT 25 MCG/0.5ML IJ INJ
0.5000 mL | INJECTION | INTRAMUSCULAR | Status: AC
  Administered 2016-01-18: 0.5 mL via INTRAMUSCULAR
  Filled 2016-01-17: qty 0.5

## 2016-01-17 MED ORDER — SENNOSIDES-DOCUSATE SODIUM 8.6-50 MG PO TABS: 1.0000 | ORAL_TABLET | Freq: Every evening | ORAL | Status: DC | PRN

## 2016-01-17 MED ORDER — SODIUM CHLORIDE 0.9 % IV SOLN
INTRAVENOUS | Status: DC
  Administered 2016-01-17: 14:00:00 via INTRAVENOUS

## 2016-01-17 MED ORDER — ATORVASTATIN CALCIUM 10 MG PO TABS
20.0000 mg | ORAL_TABLET | Freq: Every day | ORAL | Status: DC
  Administered 2016-01-17 – 2016-01-19 (×3): 20 mg via ORAL
  Filled 2016-01-17 (×3): qty 2

## 2016-01-17 NOTE — Progress Notes (Signed)
Patient arrived in [id number]. Oriented to unit. Patient states symptoms are worse than prior admission, no movement in RUE, limited movement of RLE. Speech is slurred and slight facial droop.   Patient ambulated to bathroom.  Family at bedside. No further complaints at this time. Will continue to monitor.   Quita SkyeMorgan P Dishmon, RN

## 2016-01-17 NOTE — H&P (Signed)
History and Physical    [name] [name]. MVH:[id number] DOB: 1965-09-01 DOA: 01/17/2016   PCP: No primary care provider on file.   Patient coming from:  Home  Chief Complaint: Right sided weakness   HPI: [name] [name]. is a 50 y.o. male with medical history significant for HTN, HLD and recently admitted for R sided weakness and slurred speech from 8/5-01/16/2016, discharged on  Aspirin, and tie hypertensive, and cholecystectomy meds.  He had a full evaluation including MRI of the brain and CT and you was performed, the MRI revealing acute infarct in the left pontine region as well as evidence of a chronic infarct in the right thalamic region. He presented again today reporting right upper and lower extremity "heaviness", progressing this morning to the point that he was barely able to move his right upper and lower extremities, with some slurring of his speech. He also noted right facial droop.neurology evaluated the patient, presuming this is a new ischemic event . TPA was not administered, as the symptoms were first noted at 9 p.m.yesterday, outside of the window for intervention. Denies any headaches, vision changes. 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 No dysphagia.  No nausea or vomiting. No chest pain or shortness of breath. No abdominal pain. No confusion or seizure activity. Risk factors include a history of obesity, varicose veins, smoking about 2 packs per day since young age, hypertension, likely DM as per Hb A1C 8.3 on last admission. Denies any history of recreational drug use. The patient drinks multiple times a week, but without a history of withdrawal. Denies any recent long distance trips, he denies any hormonal supplements. Patient is not compliant with meds. Wife reports he did not take his BP meds prior to admission." he has them in the car". No family history of strokes.  ED Course:  BP (!) 179/105   Pulse 68   Temp  99.4 F (37.4 C) (Oral)   Resp 16   SpO2 96%    MRI of the brain without contrast pending 2-D echo performed on 8/6 Normal LV systolic function; grade 2 diastolic dysfunction;  severe LVH more prominent in the septum; no SAM; possible   hypertrophic cardiomyopathy EF 55-60 Glucose 177. Hb A1C 8.5   Review of Systems: As per HPI otherwise 10 point review of systems negative.   Past Medical History:  Diagnosis Date  . Allergy   . Hernia, umbilical   . Hyperlipidemia   . Hypertension     Past Surgical History:  Procedure Laterality Date  . UVULOPALATOPHARYNGOPLASTY    . VARICOSE VEIN SURGERY      Social History Social History   Social History  . Marital status: Married    Spouse name: N/A  . Number of children: N/A  . Years of education: N/A   Occupational History  . Not on file.   Social History Main Topics  . Smoking status: Current Every Day Smoker    Packs/day: 2.00  . Smokeless tobacco: Never Used  . Alcohol use Yes  . Drug use: No  . Sexual activity: Yes   Other Topics Concern  . Not on file   Social History Narrative  . No narrative on file     Allergies  Allergen Reactions  . Morphine And Related Itching    Family History  Problem Relation Age of Onset  . Heart attack Father       Prior to Admission medications  Medication Sig Start Date End Date Taking? Authorizing Provider  aspirin 325 MG tablet Take 1 tablet (325 mg total) by mouth daily. 01/16/16  Yes Mauricio Annett Gula, MD  atorvastatin (LIPITOR) 20 MG tablet Take 1 tablet (20 mg total) by mouth daily at 6 PM. 01/16/16  Yes Mauricio Annett Gula, MD  lisinopril-hydrochlorothiazide (ZESTORETIC) 20-12.5 MG per tablet Take 1 tablet by mouth daily. 04/17/14  Yes Jonita Albee, MD  fluticasone [facility]) 50 MCG/ACT nasal spray Place 1-2 sprays into both nostrils daily as needed for rhinitis.     Historical Provider, MD    Physical Exam:    Vitals:   01/17/16 1038 01/17/16 1045 01/17/16  1115 01/17/16 1156  BP: (!) 148/122 (!) 173/115 (!) 179/111 (!) 179/105  Pulse: 68 77 64 68  Resp: 17 16 17 16   Temp:      TempSrc:      SpO2: 99% 99% 98% 96%       Constitutional: NAD,but ill apearing, uncomfortable due to right sided weakness, disheveled appearance  Vitals:   01/17/16 1038 01/17/16 1045 01/17/16 1115 01/17/16 1156  BP: (!) 148/122 (!) 173/115 (!) 179/111 (!) 179/105  Pulse: 68 77 64 68  Resp: 17 16 17 16   Temp:      TempSrc:      SpO2: 99% 99% 98% 96%   Eyes: PERRL, lids and conjunctivae normal ENMT: Mucous membranes are moist. Posterior pharynx clear of any exudate or lesions.Normal dentition.  Neck: normal, supple, no masses, no thyromegaly Respiratory: clear to auscultation bilaterally, no wheezing, no crackles. Normal respiratory effort. No accessory muscle use.  Cardiovascular: Regular rate and rhythm, no murmurs / rubs / gallops. Trace lower extremity edema. 2+ pedal pulses. No carotid bruits.  Abdomen:  Obese,no tenderness, no masses palpated. No hepatosplenomegaly. Bowel sounds positive.  Musculoskeletal: no clubbing / cyanosis. No joint deformity upper and lower extremities. Good ROM, no contractures. Normal muscle tone.  Skin: no rashes, lesions, ulcers.  Neurologic: CN 2-12 grossly intact. Sensation intact, DTR normal. Strength 2-3/5 in RUand lower extremity, R facial droop Psychiatric: Normal judgment and insight. Alert and oriented x 3.tearfull mood.     Labs on Admission: I have personally reviewed following labs and imaging studies  CBC:  Recent Labs Lab 01/14/16 1954 01/14/16 2005 01/14/16 2056 01/17/16 1024  WBC 10.8*  --   --  9.1  NEUTROABS 7.8*  --   --  6.6  HGB 17.5* 17.3* 16.7 15.3  HCT 50.5 51.0 49.0 46.1  MCV 92.8  --   --  95.1  PLT 178  --   --  154    Basic Metabolic Panel:  Recent Labs Lab 01/14/16 1954 01/14/16 2005 01/14/16 2056 01/16/16 0714 01/17/16 1024  NA 139 142 143 138 137  K 4.2 4.1 3.3* 3.5 3.6    CL 103 104 104 106 105  CO2 22  --   --  24 24  GLUCOSE 158* 156* 173* 178* 177*  BUN 29* 38* 28* 21* 17  CREATININE 1.53* 1.40* 1.30* 0.89 0.77  CALCIUM 9.3  --   --  8.9 8.7*    GFR: Estimated Creatinine Clearance: 140.6 mL/min (by C-G formula based on SCr of 0.8 mg/dL).  Liver Function Tests:  Recent Labs Lab 01/14/16 1954 01/17/16 1024  AST 48* 25  ALT 29 26  ALKPHOS 70 55  BILITOT 1.7* 0.8  PROT 7.7 6.2*  ALBUMIN 4.4 3.4*   No results for input(s): LIPASE, AMYLASE in the last  168 hours. No results for input(s): AMMONIA in the last 168 hours.  Coagulation Profile:  Recent Labs Lab 01/14/16 1954  INR 0.91    Cardiac Enzymes: No results for input(s): CKTOTAL, CKMB, CKMBINDEX, TROPONINI in the last 168 hours.  BNP (last 3 results) No results for input(s): PROBNP in the last 8760 hours.  HbA1C:  Recent Labs  01/15/16 0543  HGBA1C 8.5*    CBG:  Recent Labs Lab 01/14/16 1953 01/14/16 2050  GLUCAP 171* 184*    Lipid Profile:  Recent Labs  01/15/16 0543  CHOL 139  HDL 37*  LDLCALC 77  TRIG 409127  CHOLHDL 3.8    Thyroid Function Tests: No results for input(s): TSH, T4TOTAL, FREET4, T3FREE, THYROIDAB in the last 72 hours.  Anemia Panel: No results for input(s): VITAMINB12, FOLATE, FERRITIN, TIBC, IRON, RETICCTPCT in the last 72 hours.  Urine analysis:    Component Value Date/Time   COLORURINE AMBER (A) 01/14/2016 2038   APPEARANCEUR CLEAR 01/14/2016 2038   LABSPEC 1.045 (H) 01/14/2016 2038   PHURINE 5.0 01/14/2016 2038   GLUCOSEU NEGATIVE 01/14/2016 2038   HGBUR NEGATIVE 01/14/2016 2038   BILIRUBINUR SMALL (A) 01/14/2016 2038   BILIRUBINUR neg 04/17/2014 1224   KETONESUR 15 (A) 01/14/2016 2038   PROTEINUR 100 (A) 01/14/2016 2038   UROBILINOGEN 0.2 04/17/2014 1224   NITRITE NEGATIVE 01/14/2016 2038   LEUKOCYTESUR NEGATIVE 01/14/2016 2038    Sepsis Labs: @LABRCNTIP (procalcitonin:4,lacticidven:4) )No results found for this or any  previous visit (from the past 240 hour(s)).   Radiological Exams on Admission: Ct Head Wo Contrast  Result Date: 01/17/2016 CLINICAL DATA:  50 year old male with worsening right arm weakness and right facial droop since yesterday. Recent stroke. Subsequent encounter. EXAM: CT HEAD WITHOUT CONTRAST TECHNIQUE: Contiguous axial images were obtained from the base of the skull through the vertex without intravenous contrast. COMPARISON:  01/15/2016 MR. 01/14/2016 CT angiogram head and neck and CT. FINDINGS: Brain: Acute left lower pontine infarct may have progressed since prior exam and is more conspicuous on the present CT. Chronic microvascular CT moderate chronic microvascular changes. No intracranial hemorrhage. Prominent basal ganglia calcifications/mineralization unchanged. No hydrocephalus. No intracranial mass lesion noted on this unenhanced exam. Vascular: No hyperdense major vessel. Skull: No acute abnormality. Sinuses/Orbits: Moderate opacification right maxillary sinus. Minimal mucosal thickening left maxillary sinus. Partial opacification ethmoid sinus air cells bilaterally. Other: Negative. IMPRESSION: Acute left lower pontine infarct may have progressed since prior exam and is more conspicuous on the present CT. Moderate microvascular changes. No intracranial hemorrhage. Moderate opacification right maxillary sinus. Minimal mucosal thickening left maxillary sinus. Partial opacification ethmoid sinus air cells bilaterally. Electronically Signed   By: Lacy DuverneySteven  Olson M.D.   On: 01/17/2016 10:36    EKG: Independently reviewed.  Assessment/Plan Active Problems:   Stroke-like symptoms   Acute right-sided weakness   Accelerated hypertension   Right sided weakness   Cerebrovascular accident (CVA) due to thrombosis of basilar artery (HCC)   HLD (hyperlipidemia)   Tobacco use disorder   Cerebral thrombosis with cerebral infarction   R sided weakness//Acute CVA Not a TPA candidate as he passed its  window of therapy. CTA : Acute left lower pontine infarct may have progressed since prior exam and is more conspicuous on the present CT. Moderate microvascular changes. No intracranial hemorrhage Admit to tele inpatient . -MRI/MRA brain pending Stroke order set Allow permissive HTN, Allow Hydralazine for BP >210/110 -Echo  -PT/OT/SLP -Aspirin  Continue statin Air overlay mattress Anticoagulation   -Neuro following,  appreciate input Social Work, likely to need inpatient rehab   Hypertension BP (!) 179/105   Pulse 68 Allow permissive hypertension for now Add Hydralazine Q6 hours as needed for BP 210/110  Hyperlipidemia Continue home statins  Type II Diabetes Current blood sugar level is 177, Hb A1C 8.5 Lab Results  Component Value Date   HGBA1C 8.5 (H) 01/15/2016   [facility] Heart healthy carb modified diet. Diabetes coordinator    Tobacco abuse with nicotine withdrawal -  Nicotine patch   -  Counseled cessation    DVT prophylaxis: Lovenox Code Status:   Full    Family Communication:  Discussed with patient wife Disposition Plan: Expect patient to be discharged to home after condition improves Consults called:    Neuro Admission status:Tele Inpatient   [facility] E, PA-C [facility]   If 7PM-7AM, please contact night-coverage [email or web address] Password [email or web address]  01/17/2016, 1:21 PM

## 2016-01-17 NOTE — ED Provider Notes (Signed)
[facility]-EMERGENCY DEPT Provider Note   CSN: [id number] Arrival date & time: 01/17/16  4098  First Provider Contact:  First MD Initiated Contact with Patient 01/17/16 [phone]        History   Chief Complaint Chief Complaint  Patient presents with  . Weakness    HPI [name] [name]. is a 50 y.o. male. He was just discharged yesterday, 8/6 from [facility] with a stroke. He presented on Friday, 83 with right facial weakness and right arm weakness to [facility]. His symptoms waxed and waned. Head pontine infarcts on MRI. Was not offered TPA because his symptoms had rapidly improved and nearly resolved upon time of neurological evaluation. He left yesterday without symptoms. He went home approximately noon. Naproxen 9 PM he started noticing recurrence of symptoms. His wife states that he "went home and smoked a lot of cigarettes". He picked her up with the reported at 1 AM. She noticed that his right face was drooping. This morning he could not use his right arm. He is drooling and his right face is still obviously weak and drooping. He has a mild frontal headache. He was discharged on aspirin and atorvastatin and his regular home blood pressure medications  HPI  Past Medical History:  Diagnosis Date  . Allergy   . Hernia, umbilical   . Hyperlipidemia   . Hypertension     Patient Active Problem List   Diagnosis Date Noted  . Cerebral thrombosis with cerebral infarction 01/17/2016  . Diabetes (HCC) 01/17/2016  . Stroke (HCC) 01/17/2016  . Cerebral infarction due to unspecified mechanism 01/15/2016  . Cerebrovascular accident (CVA) due to thrombosis of basilar artery (HCC)   . HLD (hyperlipidemia)   . Tobacco use disorder   . Stroke-like symptoms 01/14/2016  . Acute right-sided weakness 01/14/2016  . AKI (acute kidney injury) (HCC) 01/14/2016  . Accelerated hypertension 01/14/2016  . Right sided weakness 01/14/2016    Past Surgical History:  Procedure Laterality  Date  . UVULOPALATOPHARYNGOPLASTY    . VARICOSE VEIN SURGERY         Home Medications    Prior to Admission medications   Medication Sig Start Date End Date Taking? Authorizing Provider  aspirin 325 MG tablet Take 1 tablet (325 mg total) by mouth daily. 01/16/16  Yes Mauricio Annett Gula, MD  atorvastatin (LIPITOR) 20 MG tablet Take 1 tablet (20 mg total) by mouth daily at 6 PM. 01/16/16  Yes Mauricio Annett Gula, MD  lisinopril-hydrochlorothiazide (ZESTORETIC) 20-12.5 MG per tablet Take 1 tablet by mouth daily. 04/17/14  Yes Jonita Albee, MD  fluticasone [facility]) 50 MCG/ACT nasal spray Place 1-2 sprays into both nostrils daily as needed for rhinitis.     Historical Provider, MD    Family History Family History  Problem Relation Age of Onset  . Heart attack Father     Social History Social History  Substance Use Topics  . Smoking status: Current Every Day Smoker    Packs/day: 2.00  . Smokeless tobacco: Never Used  . Alcohol use Yes     Allergies   Morphine and related   Review of Systems Review of Systems  Constitutional: Negative for appetite change, chills, diaphoresis, fatigue and fever.  HENT: Negative for mouth sores, sore throat and trouble swallowing.   Eyes: Negative for visual disturbance.  Respiratory: Negative for cough, chest tightness, shortness of breath and wheezing.   Cardiovascular: Negative for chest pain.  Gastrointestinal: Negative for abdominal distention, abdominal pain, diarrhea, nausea  and vomiting.  Endocrine: Negative for polydipsia, polyphagia and polyuria.  Genitourinary: Negative for dysuria, frequency and hematuria.  Musculoskeletal: Negative for gait problem.  Skin: Negative for color change, pallor and rash.  Neurological: Positive for weakness. Negative for dizziness, syncope, light-headedness and headaches.       Right arm and leg weakness and right facial drooping. Drooling.  Hematological: Does not bruise/bleed easily.    Psychiatric/Behavioral: Negative for behavioral problems and confusion.     Physical Exam Updated Vital Signs BP (!) 189/121   Pulse 81   Temp 99.4 F (37.4 C) (Oral)   Resp (!) 30   SpO2 97%   Physical Exam  Constitutional: He is oriented to person, place, and time. He appears well-developed and well-nourished. No distress.  HENT:  Head: Normocephalic.  Eyes: Conjunctivae are normal. Pupils are equal, round, and reactive to light. No scleral icterus.  Neck: Normal range of motion. Neck supple. No thyromegaly present.  Cardiovascular: Normal rate and regular rhythm.  Exam reveals no gallop and no friction rub.   No murmur heard. Sinus rhythm on the monitor.  Pulmonary/Chest: Effort normal and breath sounds normal. No respiratory distress. He has no wheezes. He has no rales.  Abdominal: Soft. Bowel sounds are normal. He exhibits no distension. There is no tenderness. There is no rebound.  Musculoskeletal: Normal range of motion.  Neurological: He is alert and oriented to person, place, and time.  Right facial droop. No change in sensation to the right face. Denies visual defects. No additional cranial nerve abnormalities. Two-hour 5 strength right upper extremity. 2 out of 5 strength right lower extremity.  Skin: Skin is warm and dry. No rash noted.  Psychiatric: He has a normal mood and affect. His behavior is normal.     ED Treatments / Results  Labs (all labs ordered are listed, but only abnormal results are displayed) Labs Reviewed  COMPREHENSIVE METABOLIC PANEL - Abnormal; Notable for the following:       Result Value   Glucose, Bld 177 (*)    Calcium 8.7 (*)    Total Protein 6.2 (*)    Albumin 3.4 (*)    All other components within normal limits  CBC WITH DIFFERENTIAL/PLATELET  PROTIME-INR  APTT  URINE RAPID DRUG SCREEN, HOSP PERFORMED    EKG  EKG Interpretation  Date/Time:  Monday January 17 2016 09:04:10 EDT Ventricular Rate:  76 PR Interval:    QRS  Duration: 155 QT Interval:  434 QTC Calculation: 488 R Axis:   -80 Text Interpretation:  Sinus rhythm RBBB and LAFB Baseline wander in lead(s) II III aVF Unchanged vs 01/14/16 Confirmed by Fayrene FearingJAMES  MD, Myrta Mercer ([id number]) on 01/17/2016 9:37:49 AM       Radiology Ct Head Wo Contrast  Result Date: 01/17/2016 CLINICAL DATA:  50 year old male with worsening right arm weakness and right facial droop since yesterday. Recent stroke. Subsequent encounter. EXAM: CT HEAD WITHOUT CONTRAST TECHNIQUE: Contiguous axial images were obtained from the base of the skull through the vertex without intravenous contrast. COMPARISON:  01/15/2016 MR. 01/14/2016 CT angiogram head and neck and CT. FINDINGS: Brain: Acute left lower pontine infarct may have progressed since prior exam and is more conspicuous on the present CT. Chronic microvascular CT moderate chronic microvascular changes. No intracranial hemorrhage. Prominent basal ganglia calcifications/mineralization unchanged. No hydrocephalus. No intracranial mass lesion noted on this unenhanced exam. Vascular: No hyperdense major vessel. Skull: No acute abnormality. Sinuses/Orbits: Moderate opacification right maxillary sinus. Minimal mucosal thickening left maxillary sinus.  Partial opacification ethmoid sinus air cells bilaterally. Other: Negative. IMPRESSION: Acute left lower pontine infarct may have progressed since prior exam and is more conspicuous on the present CT. Moderate microvascular changes. No intracranial hemorrhage. Moderate opacification right maxillary sinus. Minimal mucosal thickening left maxillary sinus. Partial opacification ethmoid sinus air cells bilaterally. Electronically Signed   By: Lacy Duverney M.D.   On: 01/17/2016 10:36    Procedures Procedures (including critical care time)  Medications Ordered in ED Medications  atorvastatin (LIPITOR) tablet 20 mg (not administered)  lisinopril-hydrochlorothiazide (PRINZIDE,ZESTORETIC) 20-12.5 MG per tablet 1  tablet (not administered)  aspirin tablet 325 mg (not administered)   stroke: mapping our early stages of recovery book (not administered)  0.9 %  sodium chloride infusion (not administered)  acetaminophen (TYLENOL) tablet 650 mg (not administered)    Or  acetaminophen (TYLENOL) suppository 650 mg (not administered)  senna-docusate (Senokot-S) tablet 1 tablet (not administered)  insulin aspart (novoLOG) injection 0-9 Units (not administered)  enoxaparin (LOVENOX) injection 40 mg (not administered)  hydrALAZINE (APRESOLINE) injection 5-10 mg (not administered)  nicotine (NICODERM CQ - dosed in mg/24 hours) patch 21 mg (not administered)     Initial Impression / Assessment and Plan / ED Course  I have reviewed the triage vital signs and the nursing notes.  Pertinent labs & imaging results that were available during my care of the patient were reviewed by me and considered in my medical decision making (see chart for details).  Clinical Course    Patient presents with recurrent neurological symptoms. Not a TPA candidate due to recent infarcts. We'll obtain CT head to rule out hemorrhagic conversion. Dr. Dareen Piano of neurology consult and will see the patient in the emergency room. EKG shows sinus rhythm. Right bundle branch block. No ectopy.  Final Clinical Impressions(s) / ED Diagnoses   Final diagnoses:  Cerebral infarction due to unspecified mechanism    Schedules neurology, and tried hospitalist. CT now shows demonstrable left pontine CVA. No hemorrhagic conversion. Will be admitted.  New Prescriptions New Prescriptions   No medications on file     Rolland Porter, MD 01/17/16 1407

## 2016-01-17 NOTE — ED Triage Notes (Signed)
Pt. D/C yesterday from here after treatment for a stroke and sts that the symptoms have gotten worse since last night. Daughter is at bedside and says that his speech sounds slurred. Pt. Has no movement on the R side and is complaining of a minor headache and there is aminor R sided facial droop when smiling

## 2016-01-17 NOTE — Consult Note (Signed)
Initial Neurological Consultation                      NEURO HOSPITALIST CONSULT NOTE   Requestig physician: Dr. Fayrene Fearing   Reason for Consult:  Right hemiparesis   HPI:                                                                                                                                          [name] [name]. is an 50 y.o. male who was recently admitted to the hospital with a small left pontine infarct. He was discharged to home yesterday. His initial presentation was that of right-sided weakness. He was hospitalized from 01/14/2016 to 01/16/2016. He underwent full evaluation including MRI of the brain as well as CT angiogram. 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 Echocardiogram had also been requested but there is no report in the chart at this time  [name] [name] presents today reporting that around 9:00 last night he started to notice some heaviness in his right upper and lower extremities. By this morning he noted the symptoms had progressed and he was barely able to move his right upper or lower extremities he also reported slurring of speech. He notes a right facial droop as well.  On direct exam [name] [name] is actually able to lift his right arm against gravity as well as his right lower extremity but he reports that feel very heavy he is noted to have slurring of speech that does not appear a phasic she also has a right facial droop.  Past Medical History:  Diagnosis Date  . Allergy   . Hernia, umbilical   . Hyperlipidemia   . Hypertension     Past Surgical History:  Procedure Laterality Date  . UVULOPALATOPHARYNGOPLASTY    . VARICOSE VEIN SURGERY      MEDICATIONS:                                                                                                                     I have reviewed the patient's current medications.  Allergies  Allergen Reactions  . Morphine And  Related Itching     Social History:  reports that he has been smoking.  He has been smoking about 2.00 packs per day. He has never used smokeless  tobacco. He reports that he drinks alcohol. He reports that he does not use drugs.  Family History  Problem Relation Age of Onset  . Heart attack Father      ROS:                                                                                                                                       History obtained from chart review  General ROS: negative for - chills, fatigue, fever, night sweats, weight gain or weight loss Psychological ROS: negative for - behavioral disorder, hallucinations, memory difficulties, mood swings or suicidal ideation Ophthalmic ROS: negative for - blurry vision, double vision, eye pain or loss of vision ENT ROS: negative for - epistaxis, nasal discharge, oral lesions, sore throat, tinnitus or vertigo Allergy and Immunology ROS: negative for - hives or itchy/watery eyes Hematological and Lymphatic ROS: negative for - bleeding problems, bruising or swollen lymph nodes Endocrine ROS: negative for - galactorrhea, hair pattern changes, polydipsia/polyuria or temperature intolerance Respiratory ROS: negative for - cough, hemoptysis, shortness of breath or wheezing Cardiovascular ROS: negative for - chest pain, dyspnea on exertion, edema or irregular heartbeat Gastrointestinal ROS: negative for - abdominal pain, diarrhea, hematemesis, nausea/vomiting or stool incontinence Genito-Urinary ROS: negative for - dysuria, hematuria, incontinence or urinary frequency/urgency Musculoskeletal ROS: negative for - joint swelling or muscular weakness Neurological ROS: as noted in HPI Dermatological ROS: negative for rash and skin lesion changes   General Exam                                                                                                      Blood pressure (!) 148/122, pulse 68, temperature 99.4 F (37.4 C),  temperature source Oral, resp. rate 17, SpO2 99 %. HEENT-  Normocephalic, no lesions, without obvious abnormality.  Normal external eye and conjunctiva.  Normal TM's bilaterally.  Normal auditory canals and external ears. Normal external nose, mucus membranes and septum.  Normal pharynx. Cardiovascular- regular rate and rhythm, S1, S2 normal, no murmur, click, rub or gallop, pulses palpable throughout   Lungs- chest clear, no wheezing, rales, normal symmetric air entry, Heart exam - S1, S2 normal, no murmur, no gallop, rate regular Abdomen- soft, non-tender; bowel sounds normal; no masses,  no organomegaly Extremities- less then 2 second capillary refill Lymph-no adenopathy palpable Musculoskeletal-no joint tenderness, deformity or swelling Skin-warm and dry, no hyperpigmentation, vitiligo, or suspicious lesions  Neurological Examination Mental Status: Alert and oriented responsive no clear evidence of aphasia.  Cranial Nerves: Pupils are equal round reactive to light and accommodation extraocular movements are intact there is depression of the right nasolabial fold neck flexion and shoulder shrug are 5 out of 5 Motor: Motor exam is normal on the left on the right the exam fluctuates at times [name] [name] is able to lift the right upper extremity against gravity but at other times this appears more difficult for him. He notes that he is unable to move his leg but when stimulus is applied to his foot he is actually able to withdraw the leg. Sensory: Pinprick and light touch intact throughout, bilaterally Deep Tendon Reflexes: 2+ and symmetric throughout Plantars: Right: downgoing   Left: downgoing Cerebellar: Normal on the left       Lab Results: Basic Metabolic Panel:  Recent Labs Lab 01/14/16 1954 01/14/16 2005 01/14/16 2056 01/16/16 0714  NA 139 142 143 138  K 4.2 4.1 3.3* 3.5  CL 103 104 104 106  CO2 22  --   --  24  GLUCOSE 158* 156* 173* 178*  BUN 29* 38* 28* 21*  CREATININE  1.53* 1.40* 1.30* 0.89  CALCIUM 9.3  --   --  8.9    Liver Function Tests:  Recent Labs Lab 01/14/16 1954  AST 48*  ALT 29  ALKPHOS 70  BILITOT 1.7*  PROT 7.7  ALBUMIN 4.4   No results for input(s): LIPASE, AMYLASE in the last 168 hours. No results for input(s): AMMONIA in the last 168 hours.  CBC:  Recent Labs Lab 01/14/16 1954 01/14/16 2005 01/14/16 2056  WBC 10.8*  --   --   NEUTROABS 7.8*  --   --   HGB 17.5* 17.3* 16.7  HCT 50.5 51.0 49.0  MCV 92.8  --   --   PLT 178  --   --     Cardiac Enzymes: No results for input(s): CKTOTAL, CKMB, CKMBINDEX, TROPONINI in the last 168 hours.  Lipid Panel:  Recent Labs Lab 01/15/16 0543  CHOL 139  TRIG 127  HDL 37*  CHOLHDL 3.8  VLDL 25  LDLCALC 77    CBG:  Recent Labs Lab 01/14/16 1953 01/14/16 2050  GLUCAP 171* 184*    Microbiology: No results found for this or any previous visit.  Coagulation Studies:  Recent Labs  01/14/16 1954  LABPROT 12.3  INR 0.91    Imaging: Ct Head Wo Contrast  Result Date: 01/17/2016 CLINICAL DATA:  50 year old male with worsening right arm weakness and right facial droop since yesterday. Recent stroke. Subsequent encounter. EXAM: CT HEAD WITHOUT CONTRAST TECHNIQUE: Contiguous axial images were obtained from the base of the skull through the vertex without intravenous contrast. COMPARISON:  01/15/2016 MR. 01/14/2016 CT angiogram head and neck and CT. FINDINGS: Brain: Acute left lower pontine infarct may have progressed since prior exam and is more conspicuous on the present CT. Chronic microvascular CT moderate chronic microvascular changes. No intracranial hemorrhage. Prominent basal ganglia calcifications/mineralization unchanged. No hydrocephalus. No intracranial mass lesion noted on this unenhanced exam. Vascular: No hyperdense major vessel. Skull: No acute abnormality. Sinuses/Orbits: Moderate opacification right maxillary sinus. Minimal mucosal thickening left maxillary  sinus. Partial opacification ethmoid sinus air cells bilaterally. Other: Negative. IMPRESSION: Acute left lower pontine infarct may have progressed since prior exam and is more conspicuous on the present CT. Moderate microvascular changes. No intracranial hemorrhage. Moderate opacification right maxillary sinus. Minimal mucosal thickening left maxillary sinus. Partial opacification ethmoid sinus air cells bilaterally. Electronically Signed   By: Lacy DuverneySteven  Olson  M.D.   On: 01/17/2016 10:36    Assessment/Plan:  [name] [name] presents for new right-sided weakness after just being discharged yesterday. His MRI previously revealed a small left pontine infarct. It is presumed that this is a new ischemic event given the right facial weakness in addition to right upper and lower extremity weakness.  [place]'s risk factors for stroke have already been addressed. However, the report on the echocardiogram is pending.  TPA was not administered as the symptoms were first noted at 9 PM yesterday and the patient is outside the time window for intervention.  1. MRI of the brain without contrast 3. PT consult, OT consult, Speech consult 4. Echocardiogram unless already performed. 5. Prophylactic therapy 6. Risk factor modification - smoking cessation 7. Telemetry monitoring 8. Frequent neuro checks 9. NPO until passes stroke swallow screen 10 please page stroke NP  Or  PA  Or MD from 8am -4 pm  as this patient from this time will be  followed by the stroke.   You can look them up on [email or web address]  Password [email or web address]   Thank you for consulting the neurology service to assist in the care of your patient!    Mariavictoria Nottingham A. Hilda Blades, M.D. Neurohospitalist Phone: [phone]  01/17/2016, 10:41 AM

## 2016-01-18 ENCOUNTER — Inpatient Hospital Stay (HOSPITAL_COMMUNITY): Payer: Managed Care, Other (non HMO)

## 2016-01-18 DIAGNOSIS — E785 Hyperlipidemia, unspecified: Secondary | ICD-10-CM

## 2016-01-18 DIAGNOSIS — M6289 Other specified disorders of muscle: Secondary | ICD-10-CM

## 2016-01-18 DIAGNOSIS — I6330 Cerebral infarction due to thrombosis of unspecified cerebral artery: Secondary | ICD-10-CM

## 2016-01-18 DIAGNOSIS — F17200 Nicotine dependence, unspecified, uncomplicated: Secondary | ICD-10-CM

## 2016-01-18 DIAGNOSIS — I10 Essential (primary) hypertension: Secondary | ICD-10-CM

## 2016-01-18 DIAGNOSIS — E119 Type 2 diabetes mellitus without complications: Secondary | ICD-10-CM

## 2016-01-18 DIAGNOSIS — I6302 Cerebral infarction due to thrombosis of basilar artery: Secondary | ICD-10-CM

## 2016-01-18 LAB — GLUCOSE, CAPILLARY
Glucose-Capillary: 167 mg/dL — ABNORMAL HIGH (ref 65–99)
Glucose-Capillary: 149 mg/dL — ABNORMAL HIGH (ref 65–99)
Glucose-Capillary: 266 mg/dL — ABNORMAL HIGH (ref 65–99)

## 2016-01-18 MED ORDER — LIVING WELL WITH DIABETES BOOK
Freq: Once | Status: AC
  Administered 2016-01-18: 08:00:00
  Filled 2016-01-18: qty 1

## 2016-01-18 MED ORDER — LORAZEPAM 2 MG/ML IJ SOLN
INTRAMUSCULAR | Status: AC
  Administered 2016-01-18: 1 mg
  Filled 2016-01-18: qty 1

## 2016-01-18 MED ORDER — GADOBENATE DIMEGLUMINE 529 MG/ML IV SOLN
20.0000 mL | Freq: Once | INTRAVENOUS | Status: AC | PRN
  Administered 2016-01-18: 20 mL via INTRAVENOUS

## 2016-01-18 MED ORDER — SODIUM CHLORIDE 0.9 % IV SOLN
INTRAVENOUS | Status: DC
  Administered 2016-01-18 – 2016-01-19 (×2): via INTRAVENOUS

## 2016-01-18 MED ORDER — LORAZEPAM 2 MG/ML IJ SOLN: 1.0000 mg | Freq: Once | INTRAMUSCULAR | Status: DC

## 2016-01-18 NOTE — Progress Notes (Signed)
STROKE TEAM PROGRESS NOTE   HISTORY OF PRESENT ILLNESS (per record) [name] [name]. is an 50 y.o. male who was recently admitted to the hospital with a small left pontine infarct. He was discharged to home yesterday. His initial presentation was that of right-sided weakness. He was hospitalized from 01/14/2016 to 01/16/2016. He underwent full evaluation including MRI of the brain as well as CT angiogram. 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 Echocardiogram had also been requested but there is no report in the chart at this time  [name] [name] presents today 01/17/2016 reporting that around 9:00 last night 01/16/2016 he started to notice some heaviness in his right upper and lower extremities. By this morning he noted the symptoms had progressed and he was barely able to move his right upper or lower extremities he also reported slurring of speech. He notes a right facial droop as well.  On direct exam [name] [name] is actually able to lift his right arm against gravity as well as his right lower extremity but he reports that feel very heavy he is noted to have slurring of speech that does not appear a phasic she also has a right facial droop.  Patient was not administered IV t-PA secondary to recent stroke, not within the window. He was admitted for further evaluation and treatment.   SUBJECTIVE (INTERVAL HISTORY) His wife is at the bedside.  He states he overexerted himself upon return home last week, working in the garage - states he was feeling ok. Wife came home and felt he was worse then described. Patient just came back from MRI which are personally reviewed shows extension of his pontine infarct. Formal report from radiologist is pending   OBJECTIVE Temp:  [97.7 F (36.5 C)-99.6 F (37.6 C)] 98.2 F (36.8 C) (08/08 0500) Pulse Rate:  [63-87] 73 (08/08 0500) Cardiac Rhythm: Normal sinus rhythm;Bundle branch  block;Heart block (08/08 0700) Resp:  [15-30] 16 (08/08 0500) BP: (160-199)/(89-121) 176/98 (08/08 0500) SpO2:  [96 %-100 %] 98 % (08/08 0500) Weight:  [117.9 kg (260 lb)] 117.9 kg (260 lb) (08/07 1543)  CBC:  Recent Labs Lab 01/14/16 1954  01/14/16 2056 01/17/16 1024  WBC 10.8*  --   --  9.1  NEUTROABS 7.8*  --   --  6.6  HGB 17.5*  < > 16.7 15.3  HCT 50.5  < > 49.0 46.1  MCV 92.8  --   --  95.1  PLT 178  --   --  154  < > = values in this interval not displayed.  Basic Metabolic Panel:  Recent Labs Lab 01/16/16 0714 01/17/16 1024  NA 138 137  K 3.5 3.6  CL 106 105  CO2 24 24  GLUCOSE 178* 177*  BUN 21* 17  CREATININE 0.89 0.77  CALCIUM 8.9 8.7*    Lipid Panel:    Component Value Date/Time   CHOL 139 01/15/2016 0543   TRIG 127 01/15/2016 0543   HDL 37 (L) 01/15/2016 0543   CHOLHDL 3.8 01/15/2016 0543   VLDL 25 01/15/2016 0543   LDLCALC 77 01/15/2016 0543   HgbA1c:  Lab Results  Component Value Date   HGBA1C 8.5 (H) 01/15/2016   Urine Drug Screen:    Component Value Date/Time   LABOPIA NONE DETECTED 01/14/2016 2318   COCAINSCRNUR NONE DETECTED 01/14/2016 2318   LABBENZ NONE DETECTED 01/14/2016 2318   AMPHETMU NONE DETECTED 01/14/2016 2318  THCU NONE DETECTED 01/14/2016 2318   LABBARB NONE DETECTED 01/14/2016 2318      IMAGING  Dg Chest 2 View  Result Date: 01/17/2016 CLINICAL DATA:  Patient admitted with a stroke on 01/14/2016. Inability to move the right arm and severe right leg weakness today. Slurred speech. EXAM: CHEST  2 VIEW COMPARISON:  None. FINDINGS: The lungs are clear. Heart size is upper normal. No pneumothorax or pleural effusion. No focal bony abnormality. IMPRESSION: No acute disease. Electronically Signed   By: Drusilla Kanner M.D.   On: 01/17/2016 16:35   Ct Head Wo Contrast  Result Date: 01/17/2016 CLINICAL DATA:  50 year old male with worsening right arm weakness and right facial droop since yesterday. Recent stroke. Subsequent  encounter. EXAM: CT HEAD WITHOUT CONTRAST TECHNIQUE: Contiguous axial images were obtained from the base of the skull through the vertex without intravenous contrast. COMPARISON:  01/15/2016 MR. 01/14/2016 CT angiogram head and neck and CT. FINDINGS: Brain: Acute left lower pontine infarct may have progressed since prior exam and is more conspicuous on the present CT. Chronic microvascular CT moderate chronic microvascular changes. No intracranial hemorrhage. Prominent basal ganglia calcifications/mineralization unchanged. No hydrocephalus. No intracranial mass lesion noted on this unenhanced exam. Vascular: No hyperdense major vessel. Skull: No acute abnormality. Sinuses/Orbits: Moderate opacification right maxillary sinus. Minimal mucosal thickening left maxillary sinus. Partial opacification ethmoid sinus air cells bilaterally. Other: Negative. IMPRESSION: Acute left lower pontine infarct may have progressed since prior exam and is more conspicuous on the present CT. Moderate microvascular changes. No intracranial hemorrhage. Moderate opacification right maxillary sinus. Minimal mucosal thickening left maxillary sinus. Partial opacification ethmoid sinus air cells bilaterally. Electronically Signed   By: Lacy Duverney M.D.   On: 01/17/2016 10:36       PHYSICAL EXAM Obese middle aged Caucasian male  Not in distress. . Afebrile. Head is nontraumatic. Neck is supple without bruit.    Cardiac exam no murmur or gallop. Lungs are clear to auscultation. Distal pulses are well felt. Neurological Exam :  Awake alert oriented 3. Dysarthria but can be understood. No aphasia. Follows commands well. Extraocular movements are full range without nystagmus. Vision acuity seems adequate. Fundi were not visualized. Visual fields seem full to bedside confrontational testing. Right lower facial weakness. Tongue midline. Motor system exam revealed right hemiplegia with 2/5 strength in the right upper extremity and 3/5 in  the right lower extremity. Significant weakness of right grip and intrinsic hand muscles. Can barely bend his fingers. Sensation appears preserved bilaterally. Tone is reduced on the right compared to the left. Right plantar upgoing left downgoing. Gait was not tested. ASSESSMENT/PLAN [name]. is a 50 y.o. male with history of pontine stroke last week presenting with worsening of R hemiparesis after exerting himself. He did not receive IV t-PA due to recent stroke, not within LKW window.   Stroke:  right pontine infarct with evolution, initial infarct secondary to small vessel disease  .  Worsening of symptoms from stroke last week possibly related to dehydration and over-exertion.  Resultant  Worsening of R hemiparesis, dysarthria   MRI  L pontine infarct unchanged from MRI last week. No new infarct. small vessel disease.   MRA  No significant flow limiting stenosis.  LDL 77  HgbA1c 8.5  Lovenox 40 mg sq daily for VTE prophylaxis  Diet Carb Modified Fluid consistency: Thin; Room service appropriate? Yes - voice wet - concerned for aspiration. Made NPO. SLP requested to assess swallow. (of note, he did  get sedation for MRI)  aspirin 325 mg daily prior to admission, now on aspirin 325 mg daily  Patient counseled to be compliant with his antithrombotic medications  Ongoing aggressive stroke risk factor management  Therapy recommendations:  Pending. Stay in bed as much as possible today. Anticipate need CIR. Consult placed,  Start IVF for hydration  Disposition:  pending   Follow up order placed with neuro last visit - office will call pt for appt date/time with Darrol Angelarolyn Martin, NP with [facility]  Hypertension  elevated Permissive hypertension (OK if < 220/120) but gradually normalize in 5-7 days  Long-term BP goal normotensive  Hyperlipidemia  Home meds:  lipitor 20, resumed in hospital  LDL 77, goal < 70  Continue statin at discharge  Diabetes type II  HgbA1c  8.5, goal < 7.0  New diagnosis  Diabetic RN recommends discharge on metformin  Other Stroke Risk Factors  Cigarette smoker, advised to stop smoking  ETOH use, advised to drink no more than 2 drink(s) a day  Obesity, Body mass index is 37.31 kg/m., recommend weight loss, diet and exercise as appropriate   Hx stroke/TIA  01/2016 L pontine infarct d/t small vessel disease  Age indeterminate lacunar infarct in the right thalamus, possibly recent  Hospital day # 1  Rhoderick MoodyBIBY,SHARON  [facility] See Amion for Pager information 01/18/2016 11:39 AM   I have personally examined this patient, reviewed notes, independently viewed imaging studies, participated in medical decision making and plan of care. I have made any additions or clarifications directly to the above note. Agree with note above. Patient has presented with worsening dysarthria and right hemiplegia likely due to evolution of his pontine infarct. Repeat MRI scan today shows slight extension of the infarct. I had a long discussion with the patient and wife at the bedside and answered questions. Recommend he limit his physical activity today. Start IV fluids for hydration. Continue aspirin for stroke prevention. Greater than 50% time during this 35 minute visit was spent on counseling and coordination of care about stroke risk, prevention and treatment. Delia HeadyPramod Sethi, MD Medical Director [facility] Pager: [phone] 01/18/2016 2:42 PM   To contact Stroke Continuity provider, please refer to [email or web address]. After hours, contact General Neurology

## 2016-01-18 NOTE — Progress Notes (Signed)
Rehab Admissions Coordinator Note:  Patient was screened by Trish MageLogue, Calena Salem M for appropriateness for an Inpatient Acute Rehab Consult.  At this time, an inpatient rehab consult has been ordered and is pending.  I have asked rehab MD to re-evaluate patient based on more current therapy notes.  Lelon FrohlichLogue, Sylvain Hasten M 01/18/2016, 2:19 PM  I can be reached at [phone].

## 2016-01-18 NOTE — Consult Note (Signed)
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 is a 50 y.o. right handed male with history of hypertension, tobacco abuse and hyperlipidemia. Per chart review patient lives with spouse in independent prior to admission ambulating without assistive device. 2 level home with 3 steps to entry and bedroom upstairs. Wife works during the day. Patient recently admitted to the hospital with a small left pontine infarct 01/15/2016 discharged to home 01/16/2016 at supervision level maintained on aspirin. Presented back 01/17/2016 with increasing right sided weakness as well as right facial droop and slurred speech. Patient did not receive TPA. MRI of the brain did not show any new areas of infarct suspect evolution of prior pontine infarct. MRA of head and angiogram of neck showed no significant carotid stenosis in the neck. 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 Neurology follow-up suspect evolution of left pontine infarct. Patient remains on aspirin therapy. Subtenon's Lovenox added for DVT prophylaxis. Physical therapy evaluation completed 01/18/2016 and patient now requiring moderate assist for ambulation. Request made for physical medicine rehabilitation consult.   Review of Systems  Constitutional: Negative for chills and fever.  HENT: Negative for hearing loss.   Eyes: Negative for blurred vision and double vision.  Respiratory: Negative for cough and shortness of breath.   Cardiovascular: Negative for chest pain, palpitations and leg swelling.  Gastrointestinal: Positive for constipation. Negative for nausea and vomiting.  Genitourinary: Negative for dysuria and hematuria.  Musculoskeletal: Positive for myalgias.  Skin: Negative for rash.  Neurological: Positive for  speech change, focal weakness and weakness. Negative for sensory change, seizures, loss of consciousness and headaches.  All other systems reviewed and are negative.  Past Medical History:  Diagnosis Date  . Allergy   . Hernia, umbilical   . Hyperlipidemia   . Hypertension    Past Surgical History:  Procedure Laterality Date  . UVULOPALATOPHARYNGOPLASTY    . VARICOSE VEIN SURGERY     Family History  Problem Relation Age of Onset  . Heart attack Father    Social History:  reports that he has been smoking.  He has been smoking about 2.00 packs per day. He has never used smokeless tobacco. He reports that he drinks alcohol. He reports that he does not use drugs. Allergies:  Allergies  Allergen Reactions  . Morphine And Related Itching   Medications Prior to Admission  Medication Sig Dispense Refill  . aspirin 325 MG tablet Take 1 tablet (325 mg total) by mouth daily. 30 tablet 0  . atorvastatin (LIPITOR) 20 MG tablet Take 1 tablet (20 mg total) by mouth daily at 6 PM. 30 tablet 0  . lisinopril-hydrochlorothiazide (ZESTORETIC) 20-12.5 MG per tablet Take 1 tablet by mouth daily. 90 tablet 3  . fluticasone (FLONASE) 50 MCG/ACT nasal spray Place 1-2 sprays into both nostrils daily as needed for rhinitis.       Home: Home Living Family/patient expects to be discharged to:: Private residence Living Arrangements: Spouse/significant other Available Help at Discharge: Family, Friend(s), Available PRN/intermittently Type of Home: House Home Access: Stairs to enter Secretary/administrator of Steps: 3 Entrance Stairs-Rails: Right Home Layout: Two level Alternate Level Stairs-Number of Steps: flight Alternate Level Stairs-Rails: Left (then changes to R) Home Equipment: None  Lives With: Family  Functional History: Prior Function Level of Independence: Independent Comments: travels  for work, pt did have R hand residual deficits/difficulty writing as he is R handed from TIA over the  weekend Functional Status:  Mobility: Bed Mobility General bed mobility comments: pt found sitting EOB Transfers Overall transfer level: Needs assistance Equipment used: None Transfers: Sit to/from Stand Sit to Stand: Min assist General transfer comment: unable to use R UE functionally, used momentum to bring self up into standing, impulsively quick, leaning more to the L due to R sided weakness Ambulation/Gait Ambulation/Gait assistance: Mod assist Ambulation Distance (Feet): 75 Feet (x2, 1 trial without ace wrap and 1 trial with wrap) Assistive device: 1 person hand held assist Gait Pattern/deviations: Step-through pattern, Decreased dorsiflexion - right, Decreased stride length, Narrow base of support General Gait Details: Pt required mod hand held A to maintain balance due to increased weight shift to L for clearance of R foot. Ace wrap to assist with R DF. Verbal ques to focus on R foot placement and to decrease excessive extension of R knee.    Gait velocity interpretation: Below normal speed for age/gender    ADL:    Cognition: Cognition Overall Cognitive Status: Impaired/Different from baseline Orientation Level: Oriented X4 Cognition Arousal/Alertness: Awake/alert Behavior During Therapy: Impulsive Overall Cognitive Status: Impaired/Different from baseline Area of Impairment: Safety/judgement Safety/Judgement: Decreased awareness of safety, Decreased awareness of deficits General Comments: Pt is aware of R sided weakness. However pt is impulsive with movement and decreased insight to safety regarding new physical deficits.   Blood pressure (!) 129/109, pulse 75, temperature 98.8 F (37.1 C), temperature source Oral, resp. rate 18, height 5\' 10"  (1.778 m), weight 117.9 kg (260 lb), SpO2 97 %. Physical Exam  Constitutional: He is oriented to person, place, and time. He appears well-developed and well-nourished.  HENT:  Head: Normocephalic and atraumatic.  Eyes:  Conjunctivae and EOM are normal.  Neck: Normal range of motion. Neck supple. No thyromegaly present.  Cardiovascular: Normal rate and regular rhythm.   Respiratory: Effort normal and breath sounds normal. No respiratory distress.  GI: Soft. Bowel sounds are normal. He exhibits no distension.  Musculoskeletal: He exhibits no edema or tenderness.  Neurological: He is alert and oriented to person, place, and time.  Dysarthria speech but intelligible.  Good awareness of deficits Right hemifacial wekaness Sensation intact to light touch DTRs symmetric Motor: RUE: 2/5 RLE: Hip flexion 2+/5, knee extension 4/5, ankle dorsi/plantar flexion 0/5 LUE/LLE: 5/5 proximal to distal  Skin: Skin is warm and dry.  Psychiatric: He has a normal mood and affect. His behavior is normal. Thought content normal.    Results for orders placed or performed during the hospital encounter of 01/17/16 (from the past 24 hour(s))  Glucose, capillary     Status: Abnormal   Collection Time: 01/17/16  5:31 PM  Result Value Ref Range   Glucose-Capillary 215 (H) 65 - 99 mg/dL   Comment 1 Notify RN    Comment 2 Document in Chart   Glucose, capillary     Status: Abnormal   Collection Time: 01/17/16 10:47 PM  Result Value Ref Range   Glucose-Capillary 103 (H) 65 - 99 mg/dL   Comment 1 Notify RN    Comment 2 Document in Chart   Glucose, capillary     Status: Abnormal   Collection Time: 01/18/16  6:31 AM  Result Value Ref Range   Glucose-Capillary 167 (H) 65 - 99 mg/dL   Comment 1 Notify RN    Comment 2 Document in Chart   Glucose, capillary  Status: Abnormal   Collection Time: 01/18/16 11:15 AM  Result Value Ref Range   Glucose-Capillary 149 (H) 65 - 99 mg/dL   Dg Chest 2 View  Result Date: 01/17/2016 CLINICAL DATA:  Patient admitted with a stroke on 01/14/2016. Inability to move the right arm and severe right leg weakness today. Slurred speech. EXAM: CHEST  2 VIEW COMPARISON:  None. FINDINGS: The lungs are  clear. Heart size is upper normal. No pneumothorax or pleural effusion. No focal bony abnormality. IMPRESSION: No acute disease. Electronically Signed   By: Drusilla Kanner M.D.   On: 01/17/2016 16:35   Ct Head Wo Contrast  Result Date: 01/17/2016 CLINICAL DATA:  50 year old male with worsening right arm weakness and right facial droop since yesterday. Recent stroke. Subsequent encounter. EXAM: CT HEAD WITHOUT CONTRAST TECHNIQUE: Contiguous axial images were obtained from the base of the skull through the vertex without intravenous contrast. COMPARISON:  01/15/2016 MR. 01/14/2016 CT angiogram head and neck and CT. FINDINGS: Brain: Acute left lower pontine infarct may have progressed since prior exam and is more conspicuous on the present CT. Chronic microvascular CT moderate chronic microvascular changes. No intracranial hemorrhage. Prominent basal ganglia calcifications/mineralization unchanged. No hydrocephalus. No intracranial mass lesion noted on this unenhanced exam. Vascular: No hyperdense major vessel. Skull: No acute abnormality. Sinuses/Orbits: Moderate opacification right maxillary sinus. Minimal mucosal thickening left maxillary sinus. Partial opacification ethmoid sinus air cells bilaterally. Other: Negative. IMPRESSION: Acute left lower pontine infarct may have progressed since prior exam and is more conspicuous on the present CT. Moderate microvascular changes. No intracranial hemorrhage. Moderate opacification right maxillary sinus. Minimal mucosal thickening left maxillary sinus. Partial opacification ethmoid sinus air cells bilaterally. Electronically Signed   By: Lacy Duverney M.D.   On: 01/17/2016 10:36   Mr Angiogram Neck W Wo Contrast  Result Date: 01/18/2016 CLINICAL DATA:  Recent left pontine stroke with worsening symptoms of right-sided weakness. EXAM: MR HEAD WITHOUT CONTRAST MR CIRCLE OF WILLIS WITHOUT CONTRAST MRA OF THE NECK WITHOUT AND WITH CONTRAST TECHNIQUE: Multiplanar,  multiecho pulse sequences of the brain and surrounding structures were obtained according to standard protocol without intravenous contrast.; Multiplanar and multiecho pulse sequences of the neck were obtained without and with intravenous contrast. Angiographic images of the neck were obtained using MRA technique without and with intravenous contast.; Angiographic images of the Circle of Willis were obtained using MRA technique without intravenous contrast. CONTRAST:  20mL MULTIHANCE GADOBENATE DIMEGLUMINE 529 MG/ML IV SOLN COMPARISON:  MRI 01/15/2016.  CTA 01/14/2016 FINDINGS: MR HEAD FINDINGS Restricted diffusion in the left lower ventral pons is unchanged in size from the recent MRI. No new area of acute infarct since the prior study. Generalized atrophy. Moderate chronic microvascular ischemic change throughout the white matter. Chronic lacunar infarction left middle cerebellar peduncle unchanged. Negative for hemorrhage or mass.  No shift of the midline structures Mucosal edema throughout the paranasal sinuses right greater than left unchanged. Pituitary not enlarged. Normal orbital structures. MR CIRCLE OF WILLIS FINDINGS Image quality degraded by mild motion. There is misregistration in the mid basilar and internal carotid artery due to patient motion Both vertebral arteries patent to the basilar with mild stenosis distally in both vertebral arteries. Basilar widely patent. Superior cerebellar and posterior cerebral arteries patent. Fetal origin left posterior cerebral artery. Left PICA patent. Right PICA not visualized. Internal carotid artery patent bilaterally without stenosis. Anterior and middle cerebral arteries patent bilaterally without stenosis or branch occlusion. Negative for cerebral vascular malformation. MRA NECK FINDINGS Carotid  bifurcation widely patent bilaterally without significant stenosis. Mild atherosclerotic disease in the left carotid bulb. Both vertebral arteries patent to the  basilar. Moderate stenosis at the origin of the vertebral artery bilaterally. Mild stenosis distal vertebral artery bilaterally. IMPRESSION: Restricted diffusion left pons unchanged from the recent MRI compatible with subacute infarct. No new area of infarct since the prior MRI of 01/14/2014. Moderate chronic microvascular ischemic change. 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 Basilar is patent. Electronically Signed   By: Marlan Palau M.D.   On: 01/18/2016 11:15   Mr Brain Wo Contrast  Result Date: 01/18/2016 CLINICAL DATA:  Recent left pontine stroke with worsening symptoms of right-sided weakness. EXAM: MR HEAD WITHOUT CONTRAST MR CIRCLE OF WILLIS WITHOUT CONTRAST MRA OF THE NECK WITHOUT AND WITH CONTRAST TECHNIQUE: Multiplanar, multiecho pulse sequences of the brain and surrounding structures were obtained according to standard protocol without intravenous contrast.; Multiplanar and multiecho pulse sequences of the neck were obtained without and with intravenous contrast. Angiographic images of the neck were obtained using MRA technique without and with intravenous contast.; Angiographic images of the Circle of Willis were obtained using MRA technique without intravenous contrast. CONTRAST:  20mL MULTIHANCE GADOBENATE DIMEGLUMINE 529 MG/ML IV SOLN COMPARISON:  MRI 01/15/2016.  CTA 01/14/2016 FINDINGS: MR HEAD FINDINGS Restricted diffusion in the left lower ventral pons is unchanged in size from the recent MRI. No new area of acute infarct since the prior study. Generalized atrophy. Moderate chronic microvascular ischemic change throughout the white matter. Chronic lacunar infarction left middle cerebellar peduncle unchanged. Negative for hemorrhage or mass.  No shift of the midline structures Mucosal edema throughout the paranasal sinuses right greater than left unchanged. Pituitary  not enlarged. Normal orbital structures. MR CIRCLE OF WILLIS FINDINGS Image quality degraded by mild motion. There is misregistration in the mid basilar and internal carotid artery due to patient motion Both vertebral arteries patent to the basilar with mild stenosis distally in both vertebral arteries. Basilar widely patent. Superior cerebellar and posterior cerebral arteries patent. Fetal origin left posterior cerebral artery. Left PICA patent. Right PICA not visualized. Internal carotid artery patent bilaterally without stenosis. Anterior and middle cerebral arteries patent bilaterally without stenosis or branch occlusion. Negative for cerebral vascular malformation. MRA NECK FINDINGS Carotid bifurcation widely patent bilaterally without significant stenosis. Mild atherosclerotic disease in the left carotid bulb. Both vertebral arteries patent to the basilar. Moderate stenosis at the origin of the vertebral artery bilaterally. Mild stenosis distal vertebral artery bilaterally. IMPRESSION: Restricted diffusion left pons unchanged from the recent MRI compatible with subacute infarct. No new area of infarct since the prior MRI of 01/14/2014. Moderate chronic microvascular ischemic change. 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 Basilar is patent. Electronically Signed   By: Marlan Palau M.D.   On: 01/18/2016 11:15   Mr Maxine Glenn Head/brain ZO Cm  Result Date: 01/18/2016 CLINICAL DATA:  Recent left pontine stroke with worsening symptoms of right-sided weakness. EXAM: MR HEAD WITHOUT CONTRAST MR CIRCLE OF WILLIS WITHOUT CONTRAST MRA OF THE NECK WITHOUT AND WITH CONTRAST TECHNIQUE: Multiplanar, multiecho pulse sequences of the brain and surrounding structures were obtained according to standard protocol without intravenous contrast.; Multiplanar and multiecho pulse sequences of the neck were  obtained without and with intravenous contrast. Angiographic images of the neck  were obtained using MRA technique without and with intravenous contast.; Angiographic images of the Circle of Willis were obtained using MRA technique without intravenous contrast. CONTRAST:  20mL MULTIHANCE GADOBENATE DIMEGLUMINE 529 MG/ML IV SOLN COMPARISON:  MRI 01/15/2016.  CTA 01/14/2016 FINDINGS: MR HEAD FINDINGS Restricted diffusion in the left lower ventral pons is unchanged in size from the recent MRI. No new area of acute infarct since the prior study. Generalized atrophy. Moderate chronic microvascular ischemic change throughout the white matter. Chronic lacunar infarction left middle cerebellar peduncle unchanged. Negative for hemorrhage or mass.  No shift of the midline structures Mucosal edema throughout the paranasal sinuses right greater than left unchanged. Pituitary not enlarged. Normal orbital structures. MR CIRCLE OF WILLIS FINDINGS Image quality degraded by mild motion. There is misregistration in the mid basilar and internal carotid artery due to patient motion Both vertebral arteries patent to the basilar with mild stenosis distally in both vertebral arteries. Basilar widely patent. Superior cerebellar and posterior cerebral arteries patent. Fetal origin left posterior cerebral artery. Left PICA patent. Right PICA not visualized. Internal carotid artery patent bilaterally without stenosis. Anterior and middle cerebral arteries patent bilaterally without stenosis or branch occlusion. Negative for cerebral vascular malformation. MRA NECK FINDINGS Carotid bifurcation widely patent bilaterally without significant stenosis. Mild atherosclerotic disease in the left carotid bulb. Both vertebral arteries patent to the basilar. Moderate stenosis at the origin of the vertebral artery bilaterally. Mild stenosis distal vertebral artery bilaterally. IMPRESSION: Restricted diffusion left pons unchanged from the recent MRI  compatible with subacute infarct. No new area of infarct since the prior MRI of 01/14/2014. Moderate chronic microvascular ischemic change. 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 Basilar is patent. Electronically Signed   By: Marlan Palau M.D.   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  Records reviewed and summated above. Stroke: Continue secondary stroke prophylaxis and Risk Factor Modification listed below:   Antiplatelet therapy:   Blood Pressure Management:  Continue current medication with prn's with permisive HTN per primary team Statin Agent:   Diabetes management:   Tobacco abuse:   Right sided hemiparesis: fit for orthosis to prevent contractures (resting hand splint for day, wrist cock up splint at night, PRAFO, etc)  Motor recovery: Fluoxetine  1. Does the need for close, 24 hr/day medical supervision in concert with the patient's rehab needs make it unreasonable for this patient to be served in a less intensive setting? Yes 2. Co-Morbidities requiring supervision/potential complications: HTN (monitor and provide prns in accordance with increased physical exertion and pain), tobacco abuse (counsel), hyperlipidemia (cont meds), DM (Monitor in accordance with exercise and adjust meds as necessary) 3. Due to safety, disease management and patient education, does the patient require 24 hr/day rehab nursing? Yes 4. Does the patient require coordinated care of a physician, rehab nurse, PT (1-2 hrs/day, 5 days/week), OT (1-2 hrs/day, 5 days/week) and SLP (1-2 hrs/day, 5 days/week) to address physical and functional deficits in the context of the above medical diagnosis(es)? Yes Addressing deficits in the following areas: balance, endurance,  locomotion, strength, transferring, bathing, dressing, toileting, speech, swallowing and psychosocial support 5. Can the patient actively participate in an intensive therapy program of at least 3 hrs of therapy per day at least 5 days per week? Yes 6. The potential for patient  to make measurable gains while on inpatient rehab is excellent 7. Anticipated functional outcomes upon discharge from inpatient rehab are supervision and min assist  with PT, supervision and min assist with OT, independent and modified independent with SLP. 8. Estimated rehab length of stay to reach the above functional goals is: 15-19 days. 9. Does the patient have adequate social supports and living environment to accommodate these discharge functional goals? Potentially 10. Anticipated D/C setting: Home 11. Anticipated post D/C treatments: HH therapy and Home excercise program 12. Overall Rehab/Functional Prognosis: good  RECOMMENDATIONS: This patient's condition is appropriate for continued rehabilitative care in the following setting: CIR if caregiver availability at discharge. Patient has agreed to participate in recommended program. Yes Note that insurance prior authorization may be required for reimbursement for recommended care.  Comment: Rehab Admissions Coordinator to follow up.  Maryla Morrow, MD 01/18/2016

## 2016-01-18 NOTE — Progress Notes (Signed)
PT Cancellation Note  Patient Details Name: [name] RalphHerman Stern Jr. MRN: [id number] DOB: Nov 26, 1965   Cancelled Treatment:    Reason Eval/Treat Not Completed: Patient at procedure or test/unavailable. Pt off floor at MRI. PT to return as able.   Marcene BrawnChadwell, [place] Rembold Marie 01/18/2016, 9:20 AM   Lewis ShockAshly Domingos Riggi, PT, DPT Pager #: [phone] Office #: [phone]

## 2016-01-18 NOTE — Progress Notes (Signed)
SLP Cancellation Note  Patient Details Name: [name] RalphHerman Schuler Jr. MRN: [id number] DOB: 1965/08/10   Cancelled treatment:       Reason Eval/Treat Not Completed: Patient at procedure or test/unavailable.  SLP will attempt to follow up as time allows.   [name] [name], M.A., CCC-SLP [phone]  Stesha Neyens 01/18/2016, 10:02 AM

## 2016-01-18 NOTE — Progress Notes (Signed)
PROGRESS NOTE    [name] RalphHerman Daigneault Jr.  ZOX:[id number] DOB: February 08, 1966 DOA: 01/17/2016 PCP: No primary care provider on file.    Brief Narrative: , 50 year old male who was recently admitted for left pontine stroke, discharged on 8/4, presents on 8/7 for worsening weakness of the right side. h eas admitted for further evaluation. Neurology consulted and recommendations given.    Assessment & Plan:   Active Problems:   Stroke-like symptoms   Acute right-sided weakness   Accelerated hypertension   Right sided weakness   Cerebrovascular accident (CVA) due to thrombosis of basilar artery (HCC)   HLD (hyperlipidemia)   Tobacco use disorder   Cerebral thrombosis with cerebral infarction   Diabetes (HCC)   Stroke (HCC)   Left pontine infarct with worsening right sided weakness, slurred speech: Repeat MRI brain, MRA head and neck ordered.  Continue with aspirin and lipitor.  Further recommendations as per neurology.  Therapy evals are pending.    Accelerated Hypertension: permissive hypertension up to 220/120 mm hg.   Hyperlipidemia: resume lipitor.    Diabetes mellitus: CBG (last 3)   Recent Labs  01/17/16 1731 01/17/16 2247 01/18/16 0631  GLUCAP 215* 103* 167*    Resume SSI while in patient, will need metformin on discharge.      DVT prophylaxis: lovenox.  Code Status: FULL CODE.  Family Communication: wife at bedside.  Disposition Plan: pending PT eval.   Consultants:   Neurology.    Procedures: MRA head and neck.   Mri brain.   Antimicrobials:  none   Subjective: No new complaints.   Objective: Vitals:   01/17/16 2252 01/18/16 0038 01/18/16 0233 01/18/16 0500  BP: (!) 175/98 (!) 180/110 (!) 186/95 (!) 176/98  Pulse: 64 63 71 73  Resp: 16 16 16 16   Temp:  97.9 F (36.6 C)  98.2 F (36.8 C)  TempSrc:  Oral  Oral  SpO2: 99% 99% 97% 98%  Weight:      Height:       No intake or output data in the 24 hours ending 01/18/16 1052 Filed  Weights   01/17/16 1543  Weight: 117.9 kg (260 lb)    Examination:  General exam: Appears calm and comfortable  Respiratory system: Clear to auscultation. Respiratory effort normal. Cardiovascular system: S1 & S2 heard, RRR. No JVD, murmurs, rubs, gallops or clicks. No pedal edema. Gastrointestinal system: Abdomen is nondistended, soft and nontender. No organomegaly or masses felt. Normal bowel sounds heard. Central nervous system: Alert and oriented, right upper extremity and lower extremity strength is 3/5, slurring of speech, right facial droop.  Extremities: no cyanosis or edema. Skin: No rashes, lesions or ulcers     Data Reviewed: I have personally reviewed following labs and imaging studies  CBC:  Recent Labs Lab 01/14/16 1954 01/14/16 2005 01/14/16 2056 01/17/16 1024  WBC 10.8*  --   --  9.1  NEUTROABS 7.8*  --   --  6.6  HGB 17.5* 17.3* 16.7 15.3  HCT 50.5 51.0 49.0 46.1  MCV 92.8  --   --  95.1  PLT 178  --   --  154   Basic Metabolic Panel:  Recent Labs Lab 01/14/16 1954 01/14/16 2005 01/14/16 2056 01/16/16 0714 01/17/16 1024  NA 139 142 143 138 137  K 4.2 4.1 3.3* 3.5 3.6  CL 103 104 104 106 105  CO2 22  --   --  24 24  GLUCOSE 158* 156* 173* 178* 177*  BUN 29* 38* 28*  21* 17  CREATININE 1.53* 1.40* 1.30* 0.89 0.77  CALCIUM 9.3  --   --  8.9 8.7*   GFR: Estimated Creatinine Clearance: 140.6 mL/min (by C-G formula based on SCr of 0.8 mg/dL). Liver Function Tests:  Recent Labs Lab 01/14/16 1954 01/17/16 1024  AST 48* 25  ALT 29 26  ALKPHOS 70 55  BILITOT 1.7* 0.8  PROT 7.7 6.2*  ALBUMIN 4.4 3.4*   No results for input(s): LIPASE, AMYLASE in the last 168 hours. No results for input(s): AMMONIA in the last 168 hours. Coagulation Profile:  Recent Labs Lab 01/14/16 1954 01/17/16 1023  INR 0.91 0.92   Cardiac Enzymes: No results for input(s): CKTOTAL, CKMB, CKMBINDEX, TROPONINI in the last 168 hours. BNP (last 3 results) No results  for input(s): PROBNP in the last 8760 hours. HbA1C: No results for input(s): HGBA1C in the last 72 hours. CBG:  Recent Labs Lab 01/14/16 1953 01/14/16 2050 01/17/16 1731 01/17/16 2247 01/18/16 0631  GLUCAP 171* 184* 215* 103* 167*   Lipid Profile: No results for input(s): CHOL, HDL, LDLCALC, TRIG, CHOLHDL, LDLDIRECT in the last 72 hours. Thyroid Function Tests: No results for input(s): TSH, T4TOTAL, FREET4, T3FREE, THYROIDAB in the last 72 hours. Anemia Panel: No results for input(s): VITAMINB12, FOLATE, FERRITIN, TIBC, IRON, RETICCTPCT in the last 72 hours. Sepsis Labs: No results for input(s): PROCALCITON, LATICACIDVEN in the last 168 hours.  No results found for this or any previous visit (from the past 240 hour(s)).       Radiology Studies: Dg Chest 2 View  Result Date: 01/17/2016 CLINICAL DATA:  Patient admitted with a stroke on 01/14/2016. Inability to move the right arm and severe right leg weakness today. Slurred speech. EXAM: CHEST  2 VIEW COMPARISON:  None. FINDINGS: The lungs are clear. Heart size is upper normal. No pneumothorax or pleural effusion. No focal bony abnormality. IMPRESSION: No acute disease. Electronically Signed   By: Drusilla Kanner M.D.   On: 01/17/2016 16:35   Ct Head Wo Contrast  Result Date: 01/17/2016 CLINICAL DATA:  50 year old male with worsening right arm weakness and right facial droop since yesterday. Recent stroke. Subsequent encounter. EXAM: CT HEAD WITHOUT CONTRAST TECHNIQUE: Contiguous axial images were obtained from the base of the skull through the vertex without intravenous contrast. COMPARISON:  01/15/2016 MR. 01/14/2016 CT angiogram head and neck and CT. FINDINGS: Brain: Acute left lower pontine infarct may have progressed since prior exam and is more conspicuous on the present CT. Chronic microvascular CT moderate chronic microvascular changes. No intracranial hemorrhage. Prominent basal ganglia calcifications/mineralization  unchanged. No hydrocephalus. No intracranial mass lesion noted on this unenhanced exam. Vascular: No hyperdense major vessel. Skull: No acute abnormality. Sinuses/Orbits: Moderate opacification right maxillary sinus. Minimal mucosal thickening left maxillary sinus. Partial opacification ethmoid sinus air cells bilaterally. Other: Negative. IMPRESSION: Acute left lower pontine infarct may have progressed since prior exam and is more conspicuous on the present CT. Moderate microvascular changes. No intracranial hemorrhage. Moderate opacification right maxillary sinus. Minimal mucosal thickening left maxillary sinus. Partial opacification ethmoid sinus air cells bilaterally. Electronically Signed   By: Lacy Duverney M.D.   On: 01/17/2016 10:36        Scheduled Meds: . aspirin  325 mg Oral Daily  . atorvastatin  20 mg Oral q1800  . enoxaparin (LOVENOX) injection  40 mg Subcutaneous Q24H  . hydrochlorothiazide  12.5 mg Oral Daily  . insulin aspart  0-9 Units Subcutaneous TID WC  . lisinopril  20 mg Oral  Daily  . LORazepam  1-2 mg Intravenous Once  . nicotine  21 mg Transdermal Daily  . pneumococcal 23 valent vaccine  0.5 mL Intramuscular Tomorrow-1000   Continuous Infusions: . sodium chloride 75 mL/hr at 01/17/16 1410     LOS: 1 day    Time spent: 30 minutes.     Kathlen Mody, MD [facility] Pager [phone]   If 7PM-7AM, please contact night-coverage [email or web address] Password [email or web address] 01/18/2016, 10:52 AM

## 2016-01-18 NOTE — Evaluation (Signed)
Clinical/Bedside Swallow Evaluation Patient Details  Name: [name] RalphHerman Colaizzi Jr. MRN: [id number] Date of Birth: [birth year]  Today's Date: 01/18/2016 Time: SLP Start Time (ACUTE ONLY): 1400 SLP Stop Time (ACUTE ONLY): 1410 SLP Time Calculation (min) (ACUTE ONLY): 10 min  Past Medical History:  Past Medical History:  Diagnosis Date  . Allergy   . Hernia, umbilical   . Hyperlipidemia   . Hypertension    Past Surgical History:  Past Surgical History:  Procedure Laterality Date  . UVULOPALATOPHARYNGOPLASTY    . VARICOSE VEIN SURGERY     HPI:  Mr. [name] RalphHerman Rotunno Jr. is a 50 y.o. male with history of pontine stroke last week presenting with worsening of R hemiparesis and dysarthria after exerting himself. MRI revealed right pontine infarct with evolution, initial infarct secondary to small vessel disease source.  Worsening of symptoms from stroke last week possibly related to dehydration and over-exertion.   Assessment / Plan / Recommendation Clinical Impression   Patient demonstrates mild right sided oral motor impairments characterized by decreased range of motion and strength, which mildly impact the patient's ability to safely consume the least restrictive PO diet.  Recommend resuming regular textures and thin liquids with use of small bites and sips at a slow pace to minimize risks; as well as brief SLP follow up to ensure tolerance.           Aspiration Risk  Mild aspiration risk    Diet Recommendation Regular;Thin liquid   Liquid Administration via: Cup;Straw Medication Administration: Whole meds with liquid Supervision: Patient able to self feed;Intermittent supervision to cue for compensatory strategies Compensations: Slow rate;Small sips/bites Postural Changes: Seated upright at 90 degrees    Other  Recommendations Oral Care Recommendations: Oral care BID   Follow up Recommendations  Inpatient Rehab    Frequency and Duration min 2x/week  1 week       Prognosis  Prognosis for Safe Diet Advancement: Good      Swallow Study   General HPI: Mr. [name] RalphHerman Charbonnet Jr. is a 50 y.o. male with history of pontine stroke last week presenting with worsening of R hemiparesis and dysarthria after exerting himself. MRI revealed right pontine infarct with evolution, initial infarct secondary to small vessel disease source.  Worsening of symptoms from stroke last week possibly related to dehydration and over-exertion. Type of Study: Bedside Swallow Evaluation Previous Swallow Assessment: none on record  Diet Prior to this Study: NPO Temperature Spikes Noted: No Respiratory Status: Room air History of Recent Intubation: No Behavior/Cognition: Alert;Cooperative Oral Cavity Assessment: Within Functional Limits Oral Care Completed by SLP: Recent completion by staff Oral Cavity - Dentition: Poor condition (pain on left side of mouth due to tooth pain ) Vision: Functional for self-feeding Self-Feeding Abilities: Able to feed self Patient Positioning: Upright in chair Baseline Vocal Quality: Normal Volitional Cough: Strong Volitional Swallow: Able to elicit    Oral/Motor/Sensory Function Overall Oral Motor/Sensory Function: Mild impairment Facial ROM: Suspected CN VII (facial) dysfunction Facial Symmetry: Suspected CN VII (facial) dysfunction Facial Strength: Suspected CN VII (facial) dysfunction Facial Sensation: Within Functional Limits Lingual ROM: Reduced right Lingual Symmetry: Abnormal symmetry right Lingual Strength: Within Functional Limits Lingual Sensation: Within Functional Limits Velum: Impaired right Mandible: Within Functional Limits   Ice Chips Ice chips: Within functional limits Presentation: Spoon   Thin Liquid Thin Liquid: Impaired Presentation: Cup;Straw Pharyngeal  Phase Impairments: Throat Clearing - Immediate Other Comments: throat clearing in 2/10 opportunities cues for small portions at a slow pace effectively prevented further s/s  Nectar Thick Nectar Thick Liquid: Not tested   Honey Thick Honey Thick Liquid: Not tested   Puree Puree: Within functional limits   Solid   GO  Charlane Ferretti., CCC-SLP [phone]  Solid: Within functional limits        Henery Betzold 01/18/2016,2:49 PM

## 2016-01-18 NOTE — Evaluation (Signed)
Physical Therapy Evaluation Patient Details Name: [name] [name]. MRN: [id number] DOB: [birth year] Today's Date: 01/18/2016   History of Present Illness  [name] Skare. is a 50 yo male admitted with R sided weakness, facial droop, and slurred speech. 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 PMH also includeds HTN and HLD. MRI negative for any new acute changes.  Clinical Impression  Pt recently discharged 8/6 from hospital and was independent. Pt re-admitted with above and presents with deficits listed below. Tolerated ambulation well with one hand held assist and ace wrap for R foot DF. However, pt required mod A to prevent LOB due to increased L weight shift for clearance of R foot. Pt demonstrates impulsive behavior due to decreased insight of safety with new deficits. PTA pt independent and traveling extensively for work. Lives at home with family who is available for assistance as necessary. Pt is very motivated to participate in rehab to return home independently. PT recommends CIR to address deficits of strength, mobility, balance, and decreased insight to safety and deficits in order to achieve mod I to return home safely. Continue acute follow.     Follow Up Recommendations CIR;Supervision for mobility/OOB    Equipment Recommendations   (TBD at next venue)    Recommendations for Other Services Rehab consult     Precautions / Restrictions Precautions Precautions: Fall Restrictions Weight Bearing Restrictions: No      Mobility  Bed Mobility               General bed mobility comments: pt found sitting EOB  Transfers Overall transfer level: Needs assistance Equipment used: None Transfers: Sit to/from Stand Sit to Stand: Min assist         General transfer comment: unable to use R UE functionally, used momentum to bring self up into standing, impulsively quick, leaning more to the L due to R sided  weakness  Ambulation/Gait Ambulation/Gait assistance: Mod assist Ambulation Distance (Feet): 75 Feet (x2, 1 trial without ace wrap and 1 trial with wrap) Assistive device: 1 person hand held assist Gait Pattern/deviations: Step-through pattern;Decreased dorsiflexion - right;Decreased stride length;Narrow base of support   Gait velocity interpretation: Below normal speed for age/gender General Gait Details: Pt required mod hand held A to maintain balance due to increased weight shift to L for clearance of R foot. Ace wrap to assist with R DF. Verbal ques to focus on R foot placement and to decrease excessive extension of R knee.     Stairs            Wheelchair Mobility    Modified Rankin (Stroke Patients Only) Modified Rankin (Stroke Patients Only) Pre-Morbid Rankin Score: No significant disability Modified Rankin: Severe disability     Balance Overall balance assessment: Needs assistance Sitting-balance support: No upper extremity supported;Feet supported Sitting balance-Leahy Scale: Good     Standing balance support: During functional activity Standing balance-Leahy Scale: Poor                               Pertinent Vitals/Pain Pain Assessment: No/denies pain    Home Living Family/patient expects to be discharged to:: Private residence Living Arrangements: Spouse/significant other Available Help at Discharge: Family;Friend(s);Available PRN/intermittently Type of Home: House Home Access: Stairs to enter Entrance Stairs-Rails: Right Entrance Stairs-Number of Steps: 3 Home Layout: Two level Home Equipment: None      Prior Function Level of Independence: Independent  Comments: travels for work, pt did have R hand residual deficits/difficulty writing as he is R handed from TIA over the weekend     Hand Dominance   Dominant Hand: Right    Extremity/Trunk Assessment   Upper Extremity Assessment: RUE deficits/detail RUE Deficits /  Details: Sensation intact. Little to no grip strength and unable to lift arm against gravity. With PT help to bend elbow, pt demonstates resistance with triceps.          Lower Extremity Assessment: RLE deficits/detail RLE Deficits / Details: Sensation in tact. 1/5 hip flexion, 3-/5 knee extension, 2/5 knee flexion, 0/5 DF, 3/5 PF    Cervical / Trunk Assessment: Normal  Communication   Communication: Expressive difficulties (slurred speech)  Cognition Arousal/Alertness: Awake/alert Behavior During Therapy: Impulsive Overall Cognitive Status: Impaired/Different from baseline Area of Impairment: Safety/judgement         Safety/Judgement: Decreased awareness of safety;Decreased awareness of deficits     General Comments: Pt is aware of R sided weakness. However pt is impulsive with movement and decreased insight to safety regarding new physical deficits.     General Comments General comments (skin integrity, edema, etc.): Pt with difficulty accepting new diagnosis. Spoke extensively with pt and wife about prognosis and PT options    Exercises        Assessment/Plan    PT Assessment Patient needs continued PT services  PT Diagnosis Difficulty walking;Abnormality of gait;Generalized weakness   PT Problem List Decreased strength;Decreased range of motion;Decreased activity tolerance;Decreased balance;Decreased mobility;Decreased coordination;Decreased cognition;Decreased knowledge of use of DME;Decreased safety awareness  PT Treatment Interventions DME instruction;Gait training;Stair training;Functional mobility training;Therapeutic activities;Therapeutic exercise;Balance training;Neuromuscular re-education;Patient/family education   PT Goals (Current goals can be found in the Care Plan section) Acute Rehab PT Goals Patient Stated Goal: to go home and get back to work  PT Goal Formulation: With patient/family Time For Goal Achievement: 01/25/16 Potential to Achieve Goals:  Good    Frequency Min 4X/week   Barriers to discharge        Co-evaluation               End of Session Equipment Utilized During Treatment: Gait belt Activity Tolerance: Patient tolerated treatment well Patient left: in chair;with call bell/phone within reach;with chair alarm set;with family/visitor present           Time: 0981-19141141-1215 PT Time Calculation (min) (ACUTE ONLY): 34 min   Charges:   PT Evaluation $PT Eval Moderate Complexity: 1 Procedure PT Treatments $Gait Training: 8-22 mins   PT G Codes:        Pete Merten 01/18/2016, 1:48 PM  Park Literara A Kasch Borquez, SPT (student physical therapist) Acute Rehabilitation Services [phone]

## 2016-01-18 NOTE — Care Management Note (Signed)
Case Management Note  Patient Details  Name: [name] RalphHerman Santosuosso Jr. MRN: [id number] Date of Birth: Dec 06, 1965  Subjective/Objective:   Pt in with evolution of his prior CVA. He is from home with his spouse.                  Action/Plan: PT recommending CIR. CM following for discharge disposition.   Expected Discharge Date:                  Expected Discharge Plan:  IP Rehab Facility  In-House Referral:     Discharge planning Services     Post Acute Care Choice:    Choice offered to:     DME Arranged:    DME Agency:     HH Arranged:    HH Agency:     Status of Service:  In process, will continue to follow  If discussed at Long Length of Stay Meetings, dates discussed:    Additional Comments:  Kermit BaloKelli F Donnamae Muilenburg, RN 01/18/2016, 3:58 PM

## 2016-01-18 NOTE — Evaluation (Signed)
Speech Language Pathology Evaluation Patient Details Name: [name] RalphHerman Goracke Jr. MRN: [id number] DOB: [birth year] Today's Date: 01/18/2016 Time: 4098-11911410-1419 SLP Time Calculation (min) (ACUTE ONLY): 9 min  Problem List:  Patient Active Problem List   Diagnosis Date Noted  . Cerebral thrombosis with cerebral infarction 01/17/2016  . Diabetes (HCC) 01/17/2016  . Stroke (HCC) 01/17/2016  . Cerebral infarction due to unspecified mechanism 01/15/2016  . Cerebrovascular accident (CVA) due to thrombosis of basilar artery (HCC)   . HLD (hyperlipidemia)   . Tobacco use disorder   . Stroke-like symptoms 01/14/2016  . Acute right-sided weakness 01/14/2016  . AKI (acute kidney injury) (HCC) 01/14/2016  . Accelerated hypertension 01/14/2016  . Right sided weakness 01/14/2016   Past Medical History:  Past Medical History:  Diagnosis Date  . Allergy   . Hernia, umbilical   . Hyperlipidemia   . Hypertension    Past Surgical History:  Past Surgical History:  Procedure Laterality Date  . UVULOPALATOPHARYNGOPLASTY    . VARICOSE VEIN SURGERY     HPI:  [name] is a 50 y.o. male with history of pontine stroke last week presenting with worsening of R hemiparesis and dysarthria after exerting himself. MRI revealed right pontine infarct with evolution, initial infarct secondary to small vessel disease source.  Worsening of symptoms from stroke last week possibly related to dehydration and over-exertion.   Assessment / Plan / Recommendation Clinical Impression  Patient demonstrates mild right sided oral motor impairments characterized by decreased range of motion and strength, which impact the patient's ability to produce precise consonant productions and overall speech intelligibility.  Patient would benefit from skilled acute SLP intervention in order to maximize his functional independence prior to discharge. Patient would also benefit from SLP services on inpatient rehab to further  maximize independence priro to discharge home.      SLP Assessment  Patient needs continued Speech Lanaguage Pathology Services    Follow Up Recommendations  Inpatient Rehab    Frequency and Duration min 2x/week  1 week      SLP Evaluation Prior Functioning  Cognitive/Linguistic Baseline: Within functional limits Type of Home: House  Lives With: Family Available Help at Discharge: Family;Friend(s);Available PRN/intermittently Vocation: Full time employment   Cognition  Overall Cognitive Status: Within Functional Limits for tasks assessed Orientation Level: Oriented X4    Comprehension  Auditory Comprehension Overall Auditory Comprehension: Appears within functional limits for tasks assessed    Expression Expression Primary Mode of Expression: Verbal Verbal Expression Overall Verbal Expression: Appears within functional limits for tasks assessed Written Expression Dominant Hand: Right   Oral / Motor  Oral Motor/Sensory Function Overall Oral Motor/Sensory Function: Mild impairment Facial ROM: Suspected CN VII (facial) dysfunction Facial Symmetry: Suspected CN VII (facial) dysfunction Facial Strength: Suspected CN VII (facial) dysfunction Facial Sensation: Within Functional Limits Lingual ROM: Within Functional Limits Lingual Symmetry: Abnormal symmetry right Lingual Strength: Within Functional Limits Lingual Sensation: Within Functional Limits Velum: Impaired right Mandible: Within Functional Limits Motor Speech Overall Motor Speech: Impaired Respiration: Within functional limits Phonation: Normal Resonance: Within functional limits Articulation: Impaired Level of Impairment: Word Intelligibility: Intelligibility reduced Word: 75-100% accurate Phrase: 75-100% accurate Sentence: 75-100% accurate Motor Planning: Witnin functional limits Motor Speech Errors: Aware Effective Techniques: Over-articulate   GO                   Charlane FerrettiMelissa Lorieann Argueta, M.A.,  CCC-SLP [phone]   [name] 01/18/2016, 2:34 PM

## 2016-01-18 NOTE — Progress Notes (Signed)
Inpatient Diabetes Program Recommendations  AACE/ADA: New Consensus Statement on Inpatient Glycemic Control (2015)  Target Ranges:  Prepandial:   less than 140 mg/dL      Peak postprandial:   less than 180 mg/dL (1-2 hours)      Critically ill patients:  140 - 180 mg/dL   Lab Results  Component Value Date   GLUCAP 167 (H) 01/18/2016   HGBA1C 8.5 (H) 01/15/2016    Review of Glycemic Control  Results for [name] [name], Ancel JR. (MRN [id number]) as of 01/18/2016 10:01  Ref. Range 01/17/2016 17:31 01/17/2016 22:47 01/18/2016 06:31  Glucose-Capillary Latest Ref Range: 65 - 99 mg/dL 409215 (H) 811103 (H) 914167 (H)    Diabetes history: Type 2 Outpatient Diabetes medications: none noted Current orders for Inpatient glycemic control: Novolog 0-9 units tid with meals  Inpatient Diabetes Program Recommendations:  Agree with current orders for blood sugar management.  Based on medical record, it does not appear that this patient was taking diabetes medications at home; A1C 8.5%.  If not contraindicated, consider discharging patient home on Metformin.   Susette RacerJulie Ronnel Zuercher, RN, BA, MHA, CDE Diabetes Coordinator Inpatient Diabetes Program  [phone] (Team Pager) [phone] [facility] Office) 01/18/2016 10:06 AM

## 2016-01-18 NOTE — Progress Notes (Signed)
Physical medicine rehabilitation consult requested chart reviewed. As per initial physical therapy evaluation patient independent ambulating 300 feet without assistive device. Recommendations were for no follow-up therapies. Occupational therapy evaluation yet to be completed. At this time recommendations are discharged to home. Hold on formal rehabilitation consult at this time

## 2016-01-19 DIAGNOSIS — I639 Cerebral infarction, unspecified: Secondary | ICD-10-CM

## 2016-01-19 DIAGNOSIS — E1165 Type 2 diabetes mellitus with hyperglycemia: Secondary | ICD-10-CM

## 2016-01-19 LAB — BASIC METABOLIC PANEL
ANION GAP: 8 (ref 5–15)
BUN: 16 mg/dL (ref 6–20)
CALCIUM: 8.7 mg/dL — AB (ref 8.9–10.3)
CO2: 25 mmol/L (ref 22–32)
Chloride: 104 mmol/L (ref 101–111)
Creatinine, Ser: 0.78 mg/dL (ref 0.61–1.24)
GLUCOSE: 252 mg/dL — AB (ref 65–99)
POTASSIUM: 3.7 mmol/L (ref 3.5–5.1)
SODIUM: 137 mmol/L (ref 135–145)

## 2016-01-19 LAB — GLUCOSE, CAPILLARY
GLUCOSE-CAPILLARY: 196 mg/dL — AB (ref 65–99)
GLUCOSE-CAPILLARY: 96 mg/dL (ref 65–99)
GLUCOSE-CAPILLARY: 119 mg/dL — AB (ref 65–99)
Glucose-Capillary: 135 mg/dL — ABNORMAL HIGH (ref 65–99)

## 2016-01-19 MED ORDER — LIVING WELL WITH DIABETES BOOK
Freq: Once | Status: AC
  Administered 2016-01-19: 18:00:00
  Filled 2016-01-19: qty 1

## 2016-01-19 MED ORDER — TRAMADOL HCL 50 MG PO TABS: 100.0000 mg | ORAL_TABLET | Freq: Four times a day (QID) | ORAL | Status: DC | PRN

## 2016-01-19 MED ORDER — TRAMADOL HCL 50 MG PO TABS
50.0000 mg | ORAL_TABLET | Freq: Four times a day (QID) | ORAL | Status: DC | PRN
  Administered 2016-01-19: 100 mg via ORAL
  Filled 2016-01-19: qty 2

## 2016-01-19 NOTE — Progress Notes (Addendum)
Inpatient Rehabilitation  I met with patient and family to discuss team's recommendation for IP Rehab.  Shared booklets and answered questions.  Patient and family eager for IP Rehab admission so that patient can regain as much independence as possible.  I will initiate insurance authorization pending OT eval and updated PT notes.  Please call with questions.  Carmelia Roller., CCC/SLP Admission Coordinator  [facility]  Cell [phone]

## 2016-01-19 NOTE — Progress Notes (Signed)
PROGRESS NOTE    [name] RalphHerman Ferrando Jr.  ZOX:[id number] DOB: 12-06-1965 DOA: 01/17/2016 PCP: No primary care provider on file.    Brief Narrative:  50 year old male who was recently admitted for left pontine stroke, discharged on 8/4, presents on 8/7 for worsening weakness of the right side. h eas admitted for further evaluation. Neurology consulted and recommendations given.    Assessment & Plan:   Active Problems:   Stroke-like symptoms   Acute right-sided weakness   Accelerated hypertension   Right sided weakness   Cerebrovascular accident (CVA) due to thrombosis of basilar artery (HCC)   HLD (hyperlipidemia)   Tobacco use disorder   Cerebral thrombosis with cerebral infarction   Diabetes (HCC)   Stroke (HCC)   Benign essential HTN   #1 right point in infarct with evolution/initial infarct secondary to small vessel disease Patient had presented with worsening right-sided weakness from initial stroke and felt per neurology to be related to dehydration and overexertion with resultant right hemiparesis and some dysarthria. MRI of the head done shows no new infarct. MRA with no significant flow-limiting stenosis per neurology. Fasting lipid panel with LDL of 77. Hemoglobin A1c is 8.5. Patient currently on a carb modified diet. Continue aspirin 325 mg daily for secondary stroke prevention. Risk factor modification. Gentle hydration with fluids. Continue nicotine patch and tobacco cessation. Will likely need inpatient rehabilitation. Outpatient follow-up with neurology. Neuro following and appreciate input and recommendations.  #2 hypertension BP was elevated. Patient has been started back on home regimen of lisinopril and HCTZ. Follow.  #3 hyperlipidemia LDL at 77. Goal LDL less than 70. Continue statin.  #4 tobacco abuse Tobacco cessation. Continue nicotine patch.  #5 diabetes mellitus Hemoglobin A1c was 8.5 from 01/15/2016. Continue sliding scale insulin while in-house. On  discharge may consider starting patient on metformin versus glipizide. Outpatient follow-up.    DVT prophylaxis: Lovenox Code Status: Full Family Communication: Updated patient and wife at bedside. Disposition Plan: CIR when bed available pending insurance approval.   Consultants:   Neurology: Dr. Hilda BladesArmstrong 01/17/2016  Procedures:   CT head without contrast 01/17/2016  Chest x-ray 01/17/2016  MRI brain/MRA head and neck 01/18/2016  Antimicrobials:   None   Subjective: Patient denies any chest pain. No shortness of breath. Patient states some improvement with right-sided weakness compared to admission. Patient with complaints of headache.  Objective: Vitals:   01/19/16 0124 01/19/16 0502 01/19/16 0947 01/19/16 1424  BP: (!) 158/79 (!) 149/80 (!) 159/84 (!) 164/104  Pulse: 72 79 64 76  Resp: 20 20 18 20   Temp: 98.5 F (36.9 C) 98.2 F (36.8 C) 98 F (36.7 C) 98.9 F (37.2 C)  TempSrc: Oral Oral Oral Oral  SpO2: 97% 98% 99% 97%  Weight:      Height:        Intake/Output Summary (Last 24 hours) at 01/19/16 1643 Last data filed at 01/19/16 1300  Gross per 24 hour  Intake              720 ml  Output                0 ml  Net              720 ml   Filed Weights   01/17/16 1543  Weight: 117.9 kg (260 lb)    Examination:  General exam: Appears calm and comfortable  Respiratory system: Clear to auscultation. Respiratory effort normal. Cardiovascular system: S1 & S2 heard, RRR. No JVD, murmurs, rubs,  gallops or clicks. No pedal edema. Gastrointestinal system: Abdomen is nondistended, soft and nontender. No organomegaly or masses felt. Normal bowel sounds heard. Central nervous system: Alert and oriented. Right-sided weakness. Extremities: 5 out of 5 left upper and left lower extremity strength. 3-4/5 right-sided strength. Skin: No rashes, lesions or ulcers Psychiatry: Judgement and insight appear normal. Mood & affect appropriate.     Data Reviewed: I have  personally reviewed following labs and imaging studies  CBC:  Recent Labs Lab 01/14/16 1954 01/14/16 2005 01/14/16 2056 01/17/16 1024  WBC 10.8*  --   --  9.1  NEUTROABS 7.8*  --   --  6.6  HGB 17.5* 17.3* 16.7 15.3  HCT 50.5 51.0 49.0 46.1  MCV 92.8  --   --  95.1  PLT 178  --   --  154   Basic Metabolic Panel:  Recent Labs Lab 01/14/16 1954 01/14/16 2005 01/14/16 2056 01/16/16 0714 01/17/16 1024 01/19/16 0910  NA 139 142 143 138 137 137  K 4.2 4.1 3.3* 3.5 3.6 3.7  CL 103 104 104 106 105 104  CO2 22  --   --  24 24 25   GLUCOSE 158* 156* 173* 178* 177* 252*  BUN 29* 38* 28* 21* 17 16  CREATININE 1.53* 1.40* 1.30* 0.89 0.77 0.78  CALCIUM 9.3  --   --  8.9 8.7* 8.7*   GFR: Estimated Creatinine Clearance: 140.6 mL/min (by C-G formula based on SCr of 0.8 mg/dL). Liver Function Tests:  Recent Labs Lab 01/14/16 1954 01/17/16 1024  AST 48* 25  ALT 29 26  ALKPHOS 70 55  BILITOT 1.7* 0.8  PROT 7.7 6.2*  ALBUMIN 4.4 3.4*   No results for input(s): LIPASE, AMYLASE in the last 168 hours. No results for input(s): AMMONIA in the last 168 hours. Coagulation Profile:  Recent Labs Lab 01/14/16 1954 01/17/16 1023  INR 0.91 0.92   Cardiac Enzymes: No results for input(s): CKTOTAL, CKMB, CKMBINDEX, TROPONINI in the last 168 hours. BNP (last 3 results) No results for input(s): PROBNP in the last 8760 hours. HbA1C: No results for input(s): HGBA1C in the last 72 hours. CBG:  Recent Labs Lab 01/18/16 1115 01/18/16 1618 01/19/16 0601 01/19/16 1133 01/19/16 1608  GLUCAP 149* 266* 135* 196* 96   Lipid Profile: No results for input(s): CHOL, HDL, LDLCALC, TRIG, CHOLHDL, LDLDIRECT in the last 72 hours. Thyroid Function Tests: No results for input(s): TSH, T4TOTAL, FREET4, T3FREE, THYROIDAB in the last 72 hours. Anemia Panel: No results for input(s): VITAMINB12, FOLATE, FERRITIN, TIBC, IRON, RETICCTPCT in the last 72 hours. Sepsis Labs: No results for input(s):  PROCALCITON, LATICACIDVEN in the last 168 hours.  No results found for this or any previous visit (from the past 240 hour(s)).       Radiology Studies: Mr Angiogram Neck W Wo Contrast  Result Date: 01/18/2016 CLINICAL DATA:  Recent left pontine stroke with worsening symptoms of right-sided weakness. EXAM: MR HEAD WITHOUT CONTRAST MR CIRCLE OF WILLIS WITHOUT CONTRAST MRA OF THE NECK WITHOUT AND WITH CONTRAST TECHNIQUE: Multiplanar, multiecho pulse sequences of the brain and surrounding structures were obtained according to standard protocol without intravenous contrast.; Multiplanar and multiecho pulse sequences of the neck were obtained without and with intravenous contrast. Angiographic images of the neck were obtained using MRA technique without and with intravenous contast.; Angiographic images of the Circle of Willis were obtained using MRA technique without intravenous contrast. CONTRAST:  20mL MULTIHANCE GADOBENATE DIMEGLUMINE 529 MG/ML IV SOLN COMPARISON:  MRI  01/15/2016.  CTA 01/14/2016 FINDINGS: MR HEAD FINDINGS Restricted diffusion in the left lower ventral pons is unchanged in size from the recent MRI. No new area of acute infarct since the prior study. Generalized atrophy. Moderate chronic microvascular ischemic change throughout the white matter. Chronic lacunar infarction left middle cerebellar peduncle unchanged. Negative for hemorrhage or mass.  No shift of the midline structures Mucosal edema throughout the paranasal sinuses right greater than left unchanged. Pituitary not enlarged. Normal orbital structures. MR CIRCLE OF WILLIS FINDINGS Image quality degraded by mild motion. There is misregistration in the mid basilar and internal carotid artery due to patient motion Both vertebral arteries patent to the basilar with mild stenosis distally in both vertebral arteries. Basilar widely patent. Superior cerebellar and posterior cerebral arteries patent. Fetal origin left posterior cerebral  artery. Left PICA patent. Right PICA not visualized. Internal carotid artery patent bilaterally without stenosis. Anterior and middle cerebral arteries patent bilaterally without stenosis or branch occlusion. Negative for cerebral vascular malformation. MRA NECK FINDINGS Carotid bifurcation widely patent bilaterally without significant stenosis. Mild atherosclerotic disease in the left carotid bulb. Both vertebral arteries patent to the basilar. Moderate stenosis at the origin of the vertebral artery bilaterally. Mild stenosis distal vertebral artery bilaterally. IMPRESSION: Restricted diffusion left pons unchanged from the recent MRI compatible with subacute infarct. No new area of infarct since the prior MRI of 01/14/2014. Moderate chronic microvascular ischemic change. 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 Basilar is patent. Electronically Signed   By: Marlan Palau M.D.   On: 01/18/2016 11:15   Mr Brain Wo Contrast  Result Date: 01/18/2016 CLINICAL DATA:  Recent left pontine stroke with worsening symptoms of right-sided weakness. EXAM: MR HEAD WITHOUT CONTRAST MR CIRCLE OF WILLIS WITHOUT CONTRAST MRA OF THE NECK WITHOUT AND WITH CONTRAST TECHNIQUE: Multiplanar, multiecho pulse sequences of the brain and surrounding structures were obtained according to standard protocol without intravenous contrast.; Multiplanar and multiecho pulse sequences of the neck were obtained without and with intravenous contrast. Angiographic images of the neck were obtained using MRA technique without and with intravenous contast.; Angiographic images of the Circle of Willis were obtained using MRA technique without intravenous contrast. CONTRAST:  20mL MULTIHANCE GADOBENATE DIMEGLUMINE 529 MG/ML IV SOLN COMPARISON:  MRI 01/15/2016.  CTA 01/14/2016 FINDINGS: MR HEAD FINDINGS Restricted diffusion in the left  lower ventral pons is unchanged in size from the recent MRI. No new area of acute infarct since the prior study. Generalized atrophy. Moderate chronic microvascular ischemic change throughout the white matter. Chronic lacunar infarction left middle cerebellar peduncle unchanged. Negative for hemorrhage or mass.  No shift of the midline structures Mucosal edema throughout the paranasal sinuses right greater than left unchanged. Pituitary not enlarged. Normal orbital structures. MR CIRCLE OF WILLIS FINDINGS Image quality degraded by mild motion. There is misregistration in the mid basilar and internal carotid artery due to patient motion Both vertebral arteries patent to the basilar with mild stenosis distally in both vertebral arteries. Basilar widely patent. Superior cerebellar and posterior cerebral arteries patent. Fetal origin left posterior cerebral artery. Left PICA patent. Right PICA not visualized. Internal carotid artery patent bilaterally without stenosis. Anterior and middle cerebral arteries patent bilaterally without stenosis or branch occlusion. Negative for cerebral vascular malformation. MRA NECK FINDINGS Carotid bifurcation widely patent bilaterally without significant stenosis. Mild atherosclerotic disease in the left carotid bulb. Both vertebral arteries patent to  the basilar. Moderate stenosis at the origin of the vertebral artery bilaterally. Mild stenosis distal vertebral artery bilaterally. IMPRESSION: Restricted diffusion left pons unchanged from the recent MRI compatible with subacute infarct. No new area of infarct since the prior MRI of 01/14/2014. Moderate chronic microvascular ischemic change. 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 Basilar is patent. Electronically Signed   By: Marlan Palau M.D.   On: 01/18/2016 11:15   Mr Maxine Glenn Head/brain QQ Cm  Result Date:  01/18/2016 CLINICAL DATA:  Recent left pontine stroke with worsening symptoms of right-sided weakness. EXAM: MR HEAD WITHOUT CONTRAST MR CIRCLE OF WILLIS WITHOUT CONTRAST MRA OF THE NECK WITHOUT AND WITH CONTRAST TECHNIQUE: Multiplanar, multiecho pulse sequences of the brain and surrounding structures were obtained according to standard protocol without intravenous contrast.; Multiplanar and multiecho pulse sequences of the neck were obtained without and with intravenous contrast. Angiographic images of the neck were obtained using MRA technique without and with intravenous contast.; Angiographic images of the Circle of Willis were obtained using MRA technique without intravenous contrast. CONTRAST:  20mL MULTIHANCE GADOBENATE DIMEGLUMINE 529 MG/ML IV SOLN COMPARISON:  MRI 01/15/2016.  CTA 01/14/2016 FINDINGS: MR HEAD FINDINGS Restricted diffusion in the left lower ventral pons is unchanged in size from the recent MRI. No new area of acute infarct since the prior study. Generalized atrophy. Moderate chronic microvascular ischemic change throughout the white matter. Chronic lacunar infarction left middle cerebellar peduncle unchanged. Negative for hemorrhage or mass.  No shift of the midline structures Mucosal edema throughout the paranasal sinuses right greater than left unchanged. Pituitary not enlarged. Normal orbital structures. MR CIRCLE OF WILLIS FINDINGS Image quality degraded by mild motion. There is misregistration in the mid basilar and internal carotid artery due to patient motion Both vertebral arteries patent to the basilar with mild stenosis distally in both vertebral arteries. Basilar widely patent. Superior cerebellar and posterior cerebral arteries patent. Fetal origin left posterior cerebral artery. Left PICA patent. Right PICA not visualized. Internal carotid artery patent bilaterally without stenosis. Anterior and middle cerebral arteries patent bilaterally without stenosis or branch occlusion.  Negative for cerebral vascular malformation. MRA NECK FINDINGS Carotid bifurcation widely patent bilaterally without significant stenosis. Mild atherosclerotic disease in the left carotid bulb. Both vertebral arteries patent to the basilar. Moderate stenosis at the origin of the vertebral artery bilaterally. Mild stenosis distal vertebral artery bilaterally. IMPRESSION: Restricted diffusion left pons unchanged from the recent MRI compatible with subacute infarct. No new area of infarct since the prior MRI of 01/14/2014. Moderate chronic microvascular ischemic change. 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 Basilar is patent. Electronically Signed   By: Marlan Palau M.D.   On: 01/18/2016 11:15        Scheduled Meds: . aspirin  325 mg Oral Daily  . atorvastatin  20 mg Oral q1800  . enoxaparin (LOVENOX) injection  40 mg Subcutaneous Q24H  . hydrochlorothiazide  12.5 mg Oral Daily  . insulin aspart  0-9 Units Subcutaneous TID WC  . lisinopril  20 mg Oral Daily  . living well with diabetes book   Does not apply Once  . LORazepam  1-2 mg Intravenous Once  . nicotine  21 mg Transdermal Daily   Continuous Infusions: . sodium chloride 70 mL/hr at 01/19/16 0227  LOS: 2 days    Time spent: 35 minutes    Aliciana Ricciardi, MD [facility] Pager [phone]  If 7PM-7AM, please contact night-coverage [email or web address] Password [facility] 01/19/2016, 4:43 PM

## 2016-01-19 NOTE — Progress Notes (Signed)
Physical Therapy Treatment Patient Details Name: Jimmey RalphHerman Janssens Jr. MRN: [id number] DOB: Nov 17, 1965 Today's Date: 01/19/2016    History of Present Illness Nevin BloodgoodHerman Mitchel Jr. is a 50 yo male admitted with R sided weakness, facial droop, and slurred speech. 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 PMH also includeds HTN and HLD. MRI negative for any new acute changes.    PT Comments    Patient progressing with R foot clearance and some with balance, however, remains impulsive with high fall risk (36/56 on Berg balance assessment in high fall risk range).  Feel he will benefit from CIR level rehab for progress to MOD INDEPENDENT level prior to d/c home as will not have 24/7 assist at home.   Follow Up Recommendations  CIR     Equipment Recommendations  Other (comment) (TBA)    Recommendations for Other Services       Precautions / Restrictions Precautions Precautions: Fall    Mobility  Bed Mobility               General bed mobility comments: sitting on EOB  Transfers   Equipment used: None Transfers: Sit to/from Stand Sit to Stand: Min guard         General transfer comment: heavy use of momentum and multiple tries to complete activity with brining R foot back with cues for improved weight bearing assist for balance when coming up to stand  Ambulation/Gait Ambulation/Gait assistance: Min assist;Mod assist Ambulation Distance (Feet): 120 Feet Assistive device: 1 person hand held assist       General Gait Details: used ace wrap on R LE for DF assist, HHA on R for balance, assist with weight shift and safety   Stairs            Wheelchair Mobility    Modified Rankin (Stroke Patients Only) Modified Rankin (Stroke Patients Only) Pre-Morbid Rankin Score: No significant disability Modified Rankin: Moderately severe disability     Balance Overall balance assessment: Needs assistance   Sitting balance-Leahy Scale: Good        Standing balance-Leahy Scale: Fair                      Cognition Arousal/Alertness: Awake/alert Behavior During Therapy: Flat affect Overall Cognitive Status: Impaired/Different from baseline Area of Impairment: Safety/judgement;Awareness         Safety/Judgement: Decreased awareness of safety;Decreased awareness of deficits     General Comments: reviewed fall risk after Berg completed, discussed with nursing who reports he has been calling for assist to walk to the bathroom    Exercises      General Comments        Pertinent Vitals/Pain Pain Assessment: No/denies pain    Home Living                      Prior Function            PT Goals (current goals can now be found in the care plan section) Progress towards PT goals: Progressing toward goals    Frequency  Min 4X/week    PT Plan Current plan remains appropriate    Co-evaluation             End of Session Equipment Utilized During Treatment: Gait belt Activity Tolerance: Patient limited by fatigue (SOB with activity; HR 93) Patient left: in bed;with call bell/phone within reach;with family/visitor present;with nursing/sitter in room     Time: [id number] PT Time Calculation (  min) (ACUTE ONLY): 20 min  Charges:  $Gait Training: 8-22 mins                    G Codes:      Elray Mcgregor 2016/02/08, 1:27 PM  Sheran Lawless, [place] [phone] 02/08/2016

## 2016-01-19 NOTE — Progress Notes (Signed)
OT EVALUATION (Late Entry)    01/18/16 1500  OT Visit Information  Last OT Received On 01/18/16  Assistance Needed +1  History of Present Illness [name] [name]. is a 50 yo male admitted with R sided weakness, facial droop, and slurred speech. 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 PMH also includeds HTN and HLD. MRI negative for any new acute changes.  Precautions  Precautions Fall  Restrictions  Weight Bearing Restrictions No  Home Living  Family/patient expects to be discharged to: Private residence  Living Arrangements Spouse/significant other  Available Help at Discharge Family;Friend(s);Available PRN/intermittently  Type of Home House  Home Access Stairs to enter  Entrance Stairs-Number of Steps 3  Entrance Stairs-Rails Right  Home Layout Two level  Alternate Level Stairs-Number of Steps flight  Alternate Level Stairs-Rails Left (then changes to R)  Home Equipment None  Additional Comments has x2 pug dogs in the home that are wife's.   Lives With Family  Prior Function  Level of Independence Independent  Comments travels for work, pt did have R hand residual deficits/difficulty writing as he is R handed from TIA over the weekend  Communication  Communication Expressive difficulties (slurred speech)  Pain Assessment  Pain Assessment No/denies pain  Cognition  Arousal/Alertness Awake/alert  Behavior During Therapy Impulsive  Overall Cognitive Status Impaired/Different from baseline  Area of Impairment Safety/judgement;Awareness  Safety/Judgement Decreased awareness of safety;Decreased awareness of deficits  Awareness Anticipatory  General Comments Pt reports "that he is fine" but when asked further defines changes to his R side. pt reports no need for help to the bathroom with lack of awareness to R foot drop and decr ability to power up with R side. Knocking object off sink surface without sensation to object and only hearing the object land  and wife state "what did you knock off ? oh it was only my phone and cards and money" with half joking tone. Pt attempting to provide reasoning to all education and how only deficit is his speech with poor awareness to other deficits.   Upper Extremity Assessment  Upper Extremity Assessment RUE deficits/detail  RUE Deficits / Details Decr sensation compared to L UE demonstrated by hitting items at counter height. Pt with digit activation flexion / extension but unable to demonstrate full grasp ( brunstrom II) Pt with decr wrist extension Pt able to perform elbow flexion and extension with accessory muscles. Pt tearful during assessment adn education  RUE Sensation decreased light touch  RUE Coordination decreased fine motor;decreased gross motor  Lower Extremity Assessment  Lower Extremity Assessment Defer to PT evaluation  RLE Deficits / Details foot drop noted during OT session  Cervical / Trunk Assessment  Cervical / Trunk Assessment Normal  ADL  Overall ADL's  Needs assistance/impaired  Grooming Wash/dry hands;Min Warden/ranger Min guard;Ambulation;Regular Social worker Details (indicate cue type and reason) needed cues to safety and R LE positioning. pt with hyperextension at the knee.   Functional mobility during ADLs Min guard  General ADL Comments pt grabbing to door frames and reaching for other environmental supports at this time. pt with poor recall of R foot drop. pt when cues states "they put something on it before" Pt when completing sit<.stand lateral leans Lt and powers up with L side of body. pt provided exercises for UB and LB. pt educated on knee extension, knee flexion, glut squeeze, wrist flexion/ extension, digit flexion/ extension, elbow flexion , extension at this time.  Vision- History  Baseline Vision/History Wears glasses  Wears Glasses At all times  Vision- Assessment  Additional Comments able to read article in central vision. No  diplopia noted with oculomotor ROM  Bed Mobility  General bed mobility comments sitting in chair with chair alarm  Transfers  Overall transfer level Needs assistance  Equipment used None  Transfers Sit to/from Stand  Sit to Stand Min guard  General transfer comment unable to use R UE functionally, used momentum to bring self up into standing, impulsively quick, leaning more to the L due to R sided weakness  Balance  Sitting balance-Leahy Scale Good  Standing balance-Leahy Scale Poor  General Comments  General comments (skin integrity, edema, etc.) pt educated about R UE and risk for burning or hitting on objects. OT verbalizing finding and every day life task that could be a risk factor for patient. Pt tearful attempting to move R UE during session. Pt states "i just want to do therapy 8 times a day and get this over with" educated on suggestions to help with dogs with mobility and wife responded that "shaking a bottle with beans or treats are not solution" the dogs will fight over a treat and they wouldnt startle to sound. Wife reports "we will figure something out. He can use a spray bottle" wife again made aware of R dominant hand deficits and she said but he has another hand. Wife again made aware of balance deficits at this time. Wife again states "well i dont know. we will figure it out"  OT - End of Session  Equipment Utilized During Treatment Gait belt  Activity Tolerance Patient tolerated treatment well  Patient left in chair;with call bell/phone within reach;with chair alarm set;with family/visitor present  Nurse Communication Mobility status;Precautions  OT Assessment  OT Therapy Diagnosis  Generalized weakness;Cognitive deficits  OT Recommendation/Assessment Patient needs continued OT Services  OT Problem List Decreased strength;Decreased range of motion;Decreased activity tolerance;Impaired balance (sitting and/or standing);Decreased coordination;Decreased cognition;Decreased  safety awareness;Decreased knowledge of use of DME or AE;Decreased knowledge of precautions;Impaired UE functional use  OT Plan  OT Frequency (ACUTE ONLY) Min 2X/week  OT Treatment/Interventions (ACUTE ONLY) Self-care/ADL training;Therapeutic exercise;Neuromuscular education;DME and/or AE instruction;Therapeutic activities;Cognitive remediation/compensation;Patient/family education;Balance training  OT Recommendation  Recommendations for Other Services Rehab consult  Follow Up Recommendations CIR  OT Equipment Other (comment) (defer CIR)  Individuals Consulted  Consulted and Agree with Results and Recommendations Patient;Family member/caregiver  Family Member Consulted wfie  Acute Rehab OT Goals  Patient Stated Goal to go home and get back to work   OT Goal Formulation With patient/family  Time For Goal Achievement 02/01/16  Potential to Achieve Goals Good  OT Time Calculation  OT Start Time (ACUTE ONLY) 1424  OT Stop Time (ACUTE ONLY) 1446  OT Time Calculation (min) 22 min  OT General Charges  $OT Visit 1 Procedure  OT Evaluation  $OT Eval Moderate Complexity 1 Procedure  Written Expression  Dominant Hand Right    Pt demonstrates need for CIR admission to reach MOD I level for adls. Pt has (A) of wife upon d/c.   Mateo FlowJones, Brynn   OTR/L Pager: [phone] Office: [phone] .

## 2016-01-19 NOTE — Progress Notes (Signed)
STROKE TEAM PROGRESS NOTE   HISTORY OF PRESENT ILLNESS (per record) [name] [name]. is an 50 y.o. male who was recently admitted to the hospital with a small left pontine infarct. He was discharged to home yesterday. His initial presentation was that of right-sided weakness. He was hospitalized from 01/14/2016 to 01/16/2016. He underwent full evaluation including MRI of the brain as well as CT angiogram. 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 Echocardiogram had also been requested but there is no report in the chart at this time  [name] [name] presents today 01/17/2016 reporting that around 9:00 last night 01/16/2016 he started to notice some heaviness in his right upper and lower extremities. By this morning he noted the symptoms had progressed and he was barely able to move his right upper or lower extremities he also reported slurring of speech. He notes a right facial droop as well.  On direct exam [name] [name] is actually able to lift his right arm against gravity as well as his right lower extremity but he reports that feel very heavy he is noted to have slurring of speech that does not appear a phasic she also has a right facial droop.  Patient was not administered IV t-PA secondary to recent stroke, not within the window. He was admitted for further evaluation and treatment.   SUBJECTIVE (INTERVAL HISTORY) His wife is at the bedside.  He states he is doing better today. Rehab decision is pending OBJECTIVE Temp:  [98 F (36.7 C)-98.8 F (37.1 C)] 98 F (36.7 C) (08/09 0947) Pulse Rate:  [64-90] 64 (08/09 0947) Cardiac Rhythm: Normal sinus rhythm (08/09 0700) Resp:  [18-20] 18 (08/09 0947) BP: (129-173)/(79-115) 159/84 (08/09 0947) SpO2:  [96 %-99 %] 99 % (08/09 0947)  CBC:  Recent Labs Lab 01/14/16 1954  01/14/16 2056 01/17/16 1024  WBC 10.8*  --   --  9.1  NEUTROABS 7.8*  --   --  6.6  HGB  17.5*  < > 16.7 15.3  HCT 50.5  < > 49.0 46.1  MCV 92.8  --   --  95.1  PLT 178  --   --  154  < > = values in this interval not displayed.  Basic Metabolic Panel:   Recent Labs Lab 01/17/16 1024 01/19/16 0910  NA 137 137  K 3.6 3.7  CL 105 104  CO2 24 25  GLUCOSE 177* 252*  BUN 17 16  CREATININE 0.77 0.78  CALCIUM 8.7* 8.7*    Lipid Panel:     Component Value Date/Time   CHOL 139 01/15/2016 0543   TRIG 127 01/15/2016 0543   HDL 37 (L) 01/15/2016 0543   CHOLHDL 3.8 01/15/2016 0543   VLDL 25 01/15/2016 0543   LDLCALC 77 01/15/2016 0543   HgbA1c:  Lab Results  Component Value Date   HGBA1C 8.5 (H) 01/15/2016   Urine Drug Screen:     Component Value Date/Time   LABOPIA NONE DETECTED 01/14/2016 2318   COCAINSCRNUR NONE DETECTED 01/14/2016 2318   LABBENZ NONE DETECTED 01/14/2016 2318   AMPHETMU NONE DETECTED 01/14/2016 2318   THCU NONE DETECTED 01/14/2016 2318   LABBARB NONE DETECTED 01/14/2016 2318      IMAGING  Dg Chest 2 View  Result Date: 01/17/2016 CLINICAL DATA:  Patient admitted with a stroke on 01/14/2016. Inability to move the right arm and severe right leg weakness today. Slurred speech. EXAM: CHEST  2 VIEW COMPARISON:  None. FINDINGS: The lungs are clear. Heart size is upper normal. No pneumothorax or pleural effusion. No focal bony abnormality. IMPRESSION: No acute disease. Electronically Signed   By: Drusilla Kanner M.D.   On: 01/17/2016 16:35   Mr Angiogram Neck W Wo Contrast  Result Date: 01/18/2016 CLINICAL DATA:  Recent left pontine stroke with worsening symptoms of right-sided weakness. EXAM: MR HEAD WITHOUT CONTRAST MR CIRCLE OF WILLIS WITHOUT CONTRAST MRA OF THE NECK WITHOUT AND WITH CONTRAST TECHNIQUE: Multiplanar, multiecho pulse sequences of the brain and surrounding structures were obtained according to standard protocol without intravenous contrast.; Multiplanar and multiecho pulse sequences of the neck were obtained without and with  intravenous contrast. Angiographic images of the neck were obtained using MRA technique without and with intravenous contast.; Angiographic images of the Circle of Willis were obtained using MRA technique without intravenous contrast. CONTRAST:  20mL MULTIHANCE GADOBENATE DIMEGLUMINE 529 MG/ML IV SOLN COMPARISON:  MRI 01/15/2016.  CTA 01/14/2016 FINDINGS: MR HEAD FINDINGS Restricted diffusion in the left lower ventral pons is unchanged in size from the recent MRI. No new area of acute infarct since the prior study. Generalized atrophy. Moderate chronic microvascular ischemic change throughout the white matter. Chronic lacunar infarction left middle cerebellar peduncle unchanged. Negative for hemorrhage or mass.  No shift of the midline structures Mucosal edema throughout the paranasal sinuses right greater than left unchanged. Pituitary not enlarged. Normal orbital structures. MR CIRCLE OF WILLIS FINDINGS Image quality degraded by mild motion. There is misregistration in the mid basilar and internal carotid artery due to patient motion Both vertebral arteries patent to the basilar with mild stenosis distally in both vertebral arteries. Basilar widely patent. Superior cerebellar and posterior cerebral arteries patent. Fetal origin left posterior cerebral artery. Left PICA patent. Right PICA not visualized. Internal carotid artery patent bilaterally without stenosis. Anterior and middle cerebral arteries patent bilaterally without stenosis or branch occlusion. Negative for cerebral vascular malformation. MRA NECK FINDINGS Carotid bifurcation widely patent bilaterally without significant stenosis. Mild atherosclerotic disease in the left carotid bulb. Both vertebral arteries patent to the basilar. Moderate stenosis at the origin of the vertebral artery bilaterally. Mild stenosis distal vertebral artery bilaterally. IMPRESSION: Restricted diffusion left pons unchanged from the recent MRI compatible with subacute  infarct. No new area of infarct since the prior MRI of 01/14/2014. Moderate chronic microvascular ischemic change. 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 Basilar is patent. Electronically Signed   By: Marlan Palau M.D.   On: 01/18/2016 11:15   Mr Brain Wo Contrast  Result Date: 01/18/2016 CLINICAL DATA:  Recent left pontine stroke with worsening symptoms of right-sided weakness. EXAM: MR HEAD WITHOUT CONTRAST MR CIRCLE OF WILLIS WITHOUT CONTRAST MRA OF THE NECK WITHOUT AND WITH CONTRAST TECHNIQUE: Multiplanar, multiecho pulse sequences of the brain and surrounding structures were obtained according to standard protocol without intravenous contrast.; Multiplanar and multiecho pulse sequences of the neck were obtained without and with intravenous contrast. Angiographic images of the neck were obtained using MRA technique without and with intravenous contast.; Angiographic images of the Circle of Willis were obtained using MRA technique without intravenous contrast. CONTRAST:  20mL MULTIHANCE GADOBENATE DIMEGLUMINE 529 MG/ML IV SOLN COMPARISON:  MRI 01/15/2016.  CTA 01/14/2016 FINDINGS: MR HEAD FINDINGS Restricted diffusion in the left lower ventral pons is unchanged in size from the recent MRI. No new area of acute infarct  since the prior study. Generalized atrophy. Moderate chronic microvascular ischemic change throughout the white matter. Chronic lacunar infarction left middle cerebellar peduncle unchanged. Negative for hemorrhage or mass.  No shift of the midline structures Mucosal edema throughout the paranasal sinuses right greater than left unchanged. Pituitary not enlarged. Normal orbital structures. MR CIRCLE OF WILLIS FINDINGS Image quality degraded by mild motion. There is misregistration in the mid basilar and internal carotid artery due to patient motion Both vertebral arteries  patent to the basilar with mild stenosis distally in both vertebral arteries. Basilar widely patent. Superior cerebellar and posterior cerebral arteries patent. Fetal origin left posterior cerebral artery. Left PICA patent. Right PICA not visualized. Internal carotid artery patent bilaterally without stenosis. Anterior and middle cerebral arteries patent bilaterally without stenosis or branch occlusion. Negative for cerebral vascular malformation. MRA NECK FINDINGS Carotid bifurcation widely patent bilaterally without significant stenosis. Mild atherosclerotic disease in the left carotid bulb. Both vertebral arteries patent to the basilar. Moderate stenosis at the origin of the vertebral artery bilaterally. Mild stenosis distal vertebral artery bilaterally. IMPRESSION: Restricted diffusion left pons unchanged from the recent MRI compatible with subacute infarct. No new area of infarct since the prior MRI of 01/14/2014. Moderate chronic microvascular ischemic change. 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 Basilar is patent. Electronically Signed   By: Marlan Palau M.D.   On: 01/18/2016 11:15   Mr Maxine Glenn Head/brain ZD Cm  Result Date: 01/18/2016 CLINICAL DATA:  Recent left pontine stroke with worsening symptoms of right-sided weakness. EXAM: MR HEAD WITHOUT CONTRAST MR CIRCLE OF WILLIS WITHOUT CONTRAST MRA OF THE NECK WITHOUT AND WITH CONTRAST TECHNIQUE: Multiplanar, multiecho pulse sequences of the brain and surrounding structures were obtained according to standard protocol without intravenous contrast.; Multiplanar and multiecho pulse sequences of the neck were obtained without and with intravenous contrast. Angiographic images of the neck were obtained using MRA technique without and with intravenous contast.; Angiographic images of the Circle of Willis were obtained using MRA technique  without intravenous contrast. CONTRAST:  20mL MULTIHANCE GADOBENATE DIMEGLUMINE 529 MG/ML IV SOLN COMPARISON:  MRI 01/15/2016.  CTA 01/14/2016 FINDINGS: MR HEAD FINDINGS Restricted diffusion in the left lower ventral pons is unchanged in size from the recent MRI. No new area of acute infarct since the prior study. Generalized atrophy. Moderate chronic microvascular ischemic change throughout the white matter. Chronic lacunar infarction left middle cerebellar peduncle unchanged. Negative for hemorrhage or mass.  No shift of the midline structures Mucosal edema throughout the paranasal sinuses right greater than left unchanged. Pituitary not enlarged. Normal orbital structures. MR CIRCLE OF WILLIS FINDINGS Image quality degraded by mild motion. There is misregistration in the mid basilar and internal carotid artery due to patient motion Both vertebral arteries patent to the basilar with mild stenosis distally in both vertebral arteries. Basilar widely patent. Superior cerebellar and posterior cerebral arteries patent. Fetal origin left posterior cerebral artery. Left PICA patent. Right PICA not visualized. Internal carotid artery patent bilaterally without stenosis. Anterior and middle cerebral arteries patent bilaterally without stenosis or branch occlusion. Negative for cerebral vascular malformation. MRA NECK FINDINGS Carotid bifurcation widely patent bilaterally without significant stenosis. Mild atherosclerotic disease in the left carotid bulb. Both vertebral arteries patent to the basilar. Moderate stenosis at the origin of the vertebral artery bilaterally. Mild stenosis distal vertebral artery bilaterally. IMPRESSION: Restricted diffusion left pons unchanged from the recent MRI compatible  with subacute infarct. No new area of infarct since the prior MRI of 01/14/2014. Moderate chronic microvascular ischemic change. No significant carotid stenosis in the neck Moderate stenosis at the origin of both vertebral  arteries and mild stenosis of the distal vertebral artery bilaterally. Basilar is patent. Electronically Signed   By: Marlan Palau M.D.   On: 01/18/2016 11:15       PHYSICAL EXAM Obese middle aged Caucasian male  Not in distress. . Afebrile. Head is nontraumatic. Neck is supple without bruit.    Cardiac exam no murmur or gallop. Lungs are clear to auscultation. Distal pulses are well felt. Neurological Exam :  Awake alert oriented 3. Dysarthria but can be understood. No aphasia. Follows commands well. Extraocular movements are full range without nystagmus. Vision acuity seems adequate. Fundi were not visualized. Visual fields seem full to bedside confrontational testing. Right lower facial weakness. Tongue midline. Motor system exam revealed right hemiplegia with 3/5 strength in the right upper extremity and 4/5 in the right lower extremity. Significant weakness of right grip and intrinsic hand muscles. Can barely bend his fingers. Sensation appears preserved bilaterally. Tone is reduced on the right compared to the left. Right plantar upgoing left downgoing. Gait was not tested. ASSESSMENT/PLAN Mr. Murray Durrell. is a 50 y.o. male with history of pontine stroke last week presenting with worsening of R hemiparesis after exerting himself. He did not receive IV t-PA due to recent stroke, not within LKW window.   Stroke:  right pontine infarct with evolution, initial infarct secondary to small vessel disease  .  Worsening of symptoms from stroke last week possibly related to dehydration and over-exertion.  Resultant  Worsening of R hemiparesis, dysarthria   MRI  L pontine infarct unchanged from MRI last week. No new infarct. small vessel disease.   MRA  No significant flow limiting stenosis.  LDL 77  HgbA1c 8.5  Lovenox 40 mg sq daily for VTE prophylaxis  Diet Carb Modified Fluid consistency: Thin; Room service appropriate? Yes - voice wet - concerned for aspiration. Made NPO. SLP  requested to assess swallow. (of note, he did get sedation for MRI)  aspirin 325 mg daily prior to admission, now on aspirin 325 mg daily  Patient counseled to be compliant with his antithrombotic medications  Ongoing aggressive stroke risk factor management  Therapy recommendations:  Pending. Stay in bed as much as possible today. Anticipate need CIR. Consult placed,  Start IVF for hydration  Disposition:  pending   Follow up order placed with neuro last visit - office will call pt for appt date/time with Darrol Angel, NP with [facility]  Hypertension  elevated Permissive hypertension (OK if < 220/120) but gradually normalize in 5-7 days  Long-term BP goal normotensive  Hyperlipidemia  Home meds:  lipitor 20, resumed in hospital  LDL 77, goal < 70  Continue statin at discharge  Diabetes type II  HgbA1c 8.5, goal < 7.0  New diagnosis  Diabetic RN recommends discharge on metformin  Other Stroke Risk Factors  Cigarette smoker, advised to stop smoking  ETOH use, advised to drink no more than 2 drink(s) a day  Obesity, Body mass index is 37.31 kg/m., recommend weight loss, diet and exercise as appropriate   Hx stroke/TIA  01/2016 L pontine infarct d/t small vessel disease  Age indeterminate lacunar infarct in the right thalamus, possibly recent  Hospital day # 2  [facility] See Amion for Pager information 01/19/2016 11:19 AM  I have personally examined this patient, reviewed notes, independently viewed imaging studies, participated in medical decision making and plan of care. I have made any additions or clarifications directly to the above note. Agree with note above. Patient has presented with worsening dysarthria and right hemiplegia likely due to evolution of his pontine infarct.  . I had a long discussion with the patient and wife at the bedside and answered questions. Recommend he limit his physical activity today. Start IV fluids for  hydration. Continue aspirin for stroke prevention. Greater than 50% time during this 25 minute visit was spent on counseling and coordination of care about stroke risk, prevention and treatment. Patient is interested [facility] [facility] [employer] We give info to review at home and decide.Stroke team will sign off.  Delia HeadyPramod Reign Dziuba, MD Medical Director [facility] Pager: [phone] 01/19/2016 11:19 AM   To contact Stroke Continuity provider, please refer to [email or web address]. After hours, contact General Neurology

## 2016-01-19 NOTE — Progress Notes (Signed)
Met with patient and his wife regarding recent diagnosis.  Patient has already started to watch the diabetes videos and will continue to review.  He has gone 3 days without smoking and is committed to staying smoke free.  Willing to meet with the dietitian and willing to go to outpatient diabetes education once he is discharged from rehab- both ordered.    Patient reports that his MD has been checking A1C routinely and it has always been within limits but always close to abnormal.  He tells me his father had diabetes. All questions answered.  Please contact Inpatient team if patient or family have additional questions.   Gentry Fitz, RN, BA, MHA, CDE Diabetes Coordinator Inpatient Diabetes Program  [phone] (Team Pager) [phone] ([place]) 01/19/2016 3:57 PM

## 2016-01-19 NOTE — Progress Notes (Signed)
Inpatient Diabetes Program Recommendations  AACE/ADA: New Consensus Statement on Inpatient Glycemic Control (2015)  Target Ranges:  Prepandial:   less than 140 mg/dL      Peak postprandial:   less than 180 mg/dL (1-2 hours)      Critically ill patients:  140 - 180 mg/dL   Lab Results  Component Value Date   GLUCAP 135 (H) 01/19/2016   HGBA1C 8.5 (H) 01/15/2016    Review of Glycemic Control  Results for [name] [name], [name] JR. (MRN [id number]) as of 01/19/2016 09:26  Ref. Range 01/17/2016 22:47 01/18/2016 06:31 01/18/2016 11:15 01/18/2016 16:18 01/19/2016 06:01  Glucose-Capillary Latest Ref Range: 65 - 99 mg/dL 409103 (H) 811167 (H) 914149 (H) 266 (H) 135 (H)   Diabetes history: Type 2 Outpatient Diabetes medications: none noted Current orders for Inpatient glycemic control: Novolog 0-9 units tid with meals  Inpatient Diabetes Program Recommendations:  Agree with current orders for blood sugar management.  Based on medical record, it does not appear that this patient was taking diabetes medications at home; A1C 8.5%.  If not contraindicated, consider discharging patient home on Metformin.   Susette RacerJulie Endia Moncur, RN, BA, MHA, CDE Diabetes Coordinator Inpatient Diabetes Program  [phone] (Team Pager) [phone] [facility] Office) 01/19/2016 9:31 AM

## 2016-01-19 NOTE — Progress Notes (Signed)
Speech Language Pathology Treatment: Dysphagia;Cognitive-Linquistic  Patient Details Name: [name] [name] Jr. MRN: [id number] [name] RalphDOB: 1966-03-20 Today's Date: 01/19/2016 Time: [id number] SLP Time Calculation (min) (ACUTE ONLY): 14 min  Assessment / Plan / Recommendation Clinical Impression  Pt was seen for skilled ST targeting goals for communication and brief assessment of diet toleration.  Pt had already consumed the majority of his breakfast tray upon arrival but consumed a small cup of fresh fruit and ~4 oz of thin liquids via straw and cup sips without overt s/s of aspiration.  Pt reports good toleration of regular diet since its initiation.  Only complaint is ongoing tooth pain which was premorbid to CVA.  Therapist briefly reiterated recommendations for slow rate and small bites/sips.  Recommend that pt continue on regular solids, thin liquids.  Pt fully intelligible in conversations with therapist despite multiple environmental distractions.  Speech remains perceptibly altered due to oral motor weakness.  Pt reports decreased intelligibility as the day progresses.  Therapist provided skilled education regarding compensatory intelligibility strategies, emphasizing slow rate, increased vocal intensity, overarticulation, and pacing to achieve intelligibility in conversations.  Handout of strategies was provided to maximize carryover in between therapy sessions.  Pt remains good candidate for CIR.       HPI HPI: Mr. [name] RalphHerman Watts Jr. is a 50 y.o. male with history of pontine stroke last week presenting with worsening of R hemiparesis and dysarthria after exerting himself. MRI revealed right pontine infarct with evolution, initial infarct secondary to small vessel disease source.  Worsening of symptoms from stroke last week possibly related to dehydration and over-exertion.      SLP Plan  Continue with current plan of care     Recommendations  Diet recommendations: Regular;Thin liquid Liquids  provided via: Cup;Straw Medication Administration: Whole meds with liquid Supervision: Patient able to self feed;Intermittent supervision to cue for compensatory strategies Compensations: Slow rate;Small sips/bites             Oral Care Recommendations: Oral care BID Follow up Recommendations: Inpatient Rehab Plan: Continue with current plan of care     GO                PageMelanee Spry, Raquan Iannone L 01/19/2016, 8:46 AM

## 2016-01-20 ENCOUNTER — Encounter (HOSPITAL_COMMUNITY): Payer: Self-pay

## 2016-01-20 ENCOUNTER — Inpatient Hospital Stay (HOSPITAL_COMMUNITY)
Admission: RE | Admit: 2016-01-20 | Discharge: 2016-02-02 | DRG: 057 | Disposition: A | Discharge status: Home / Self Care | Payer: Managed Care, Other (non HMO) | Source: Intra-hospital | Attending: Physical Medicine & Rehabilitation | Admitting: Physical Medicine & Rehabilitation

## 2016-01-20 DIAGNOSIS — E669 Obesity, unspecified: Secondary | ICD-10-CM

## 2016-01-20 DIAGNOSIS — E119 Type 2 diabetes mellitus without complications: Secondary | ICD-10-CM | POA: Diagnosis not present

## 2016-01-20 DIAGNOSIS — Z7982 Long term (current) use of aspirin: Secondary | ICD-10-CM

## 2016-01-20 DIAGNOSIS — E1122 Type 2 diabetes mellitus with diabetic chronic kidney disease: Secondary | ICD-10-CM

## 2016-01-20 DIAGNOSIS — I6350 Cerebral infarction due to unspecified occlusion or stenosis of unspecified cerebral artery: Secondary | ICD-10-CM

## 2016-01-20 DIAGNOSIS — N189 Chronic kidney disease, unspecified: Secondary | ICD-10-CM

## 2016-01-20 DIAGNOSIS — E1169 Type 2 diabetes mellitus with other specified complication: Secondary | ICD-10-CM

## 2016-01-20 DIAGNOSIS — M21371 Foot drop, right foot: Secondary | ICD-10-CM

## 2016-01-20 DIAGNOSIS — F17200 Nicotine dependence, unspecified, uncomplicated: Secondary | ICD-10-CM | POA: Diagnosis not present

## 2016-01-20 DIAGNOSIS — N179 Acute kidney failure, unspecified: Secondary | ICD-10-CM | POA: Diagnosis not present

## 2016-01-20 DIAGNOSIS — I129 Hypertensive chronic kidney disease with stage 1 through stage 4 chronic kidney disease, or unspecified chronic kidney disease: Secondary | ICD-10-CM

## 2016-01-20 DIAGNOSIS — E1159 Type 2 diabetes mellitus with other circulatory complications: Secondary | ICD-10-CM | POA: Diagnosis not present

## 2016-01-20 DIAGNOSIS — I69351 Hemiplegia and hemiparesis following cerebral infarction affecting right dominant side: Secondary | ICD-10-CM | POA: Diagnosis present

## 2016-01-20 DIAGNOSIS — F329 Major depressive disorder, single episode, unspecified: Secondary | ICD-10-CM | POA: Diagnosis not present

## 2016-01-20 DIAGNOSIS — E785 Hyperlipidemia, unspecified: Secondary | ICD-10-CM

## 2016-01-20 DIAGNOSIS — I69392 Facial weakness following cerebral infarction: Secondary | ICD-10-CM | POA: Diagnosis not present

## 2016-01-20 DIAGNOSIS — G441 Vascular headache, not elsewhere classified: Secondary | ICD-10-CM

## 2016-01-20 DIAGNOSIS — Z6834 Body mass index (BMI) 34.0-34.9, adult: Secondary | ICD-10-CM

## 2016-01-20 DIAGNOSIS — I69322 Dysarthria following cerebral infarction: Secondary | ICD-10-CM | POA: Diagnosis not present

## 2016-01-20 DIAGNOSIS — I10 Essential (primary) hypertension: Secondary | ICD-10-CM | POA: Diagnosis not present

## 2016-01-20 DIAGNOSIS — F17210 Nicotine dependence, cigarettes, uncomplicated: Secondary | ICD-10-CM | POA: Diagnosis not present

## 2016-01-20 DIAGNOSIS — Z79899 Other long term (current) drug therapy: Secondary | ICD-10-CM

## 2016-01-20 DIAGNOSIS — I639 Cerebral infarction, unspecified: Secondary | ICD-10-CM | POA: Diagnosis present

## 2016-01-20 DIAGNOSIS — R4587 Impulsiveness: Secondary | ICD-10-CM | POA: Diagnosis not present

## 2016-01-20 DIAGNOSIS — G8191 Hemiplegia, unspecified affecting right dominant side: Secondary | ICD-10-CM | POA: Diagnosis not present

## 2016-01-20 LAB — CBC
HCT: 47 % (ref 39.0–52.0)
HEMOGLOBIN: 15.7 g/dL (ref 13.0–17.0)
MCH: 31.6 pg (ref 26.0–34.0)
MCHC: 33.4 g/dL (ref 30.0–36.0)
MCV: 94.6 fL (ref 78.0–100.0)
PLATELETS: 196 10*3/uL (ref 150–400)
RBC: 4.97 MIL/uL (ref 4.22–5.81)
RDW: 12.9 % (ref 11.5–15.5)
WBC: 8.3 10*3/uL (ref 4.0–10.5)

## 2016-01-20 LAB — BASIC METABOLIC PANEL
ANION GAP: 8 (ref 5–15)
BUN: 13 mg/dL (ref 6–20)
CALCIUM: 8.5 mg/dL — AB (ref 8.9–10.3)
CO2: 25 mmol/L (ref 22–32)
Chloride: 105 mmol/L (ref 101–111)
Creatinine, Ser: 0.69 mg/dL (ref 0.61–1.24)
Glucose, Bld: 121 mg/dL — ABNORMAL HIGH (ref 65–99)
Potassium: 3.3 mmol/L — ABNORMAL LOW (ref 3.5–5.1)
Sodium: 138 mmol/L (ref 135–145)

## 2016-01-20 LAB — CREATININE, SERUM: CREATININE: 0.83 mg/dL (ref 0.61–1.24)

## 2016-01-20 LAB — GLUCOSE, CAPILLARY
Glucose-Capillary: 122 mg/dL — ABNORMAL HIGH (ref 65–99)
Glucose-Capillary: 135 mg/dL — ABNORMAL HIGH (ref 65–99)
Glucose-Capillary: 110 mg/dL — ABNORMAL HIGH (ref 65–99)
Glucose-Capillary: 166 mg/dL — ABNORMAL HIGH (ref 65–99)

## 2016-01-20 MED ORDER — BLOOD GLUCOSE METER KIT: PACK | 0 refills | Status: DC

## 2016-01-20 MED ORDER — ONDANSETRON HCL 4 MG PO TABS: 4.0000 mg | ORAL_TABLET | Freq: Four times a day (QID) | ORAL | Status: DC | PRN

## 2016-01-20 MED ORDER — ACETAMINOPHEN 325 MG PO TABS: 650.0000 mg | ORAL_TABLET | ORAL | Status: DC | PRN

## 2016-01-20 MED ORDER — SORBITOL 70 % SOLN: 30.0000 mL | Freq: Every day | Status: DC | PRN

## 2016-01-20 MED ORDER — LISINOPRIL 20 MG PO TABS
20.0000 mg | ORAL_TABLET | Freq: Every day | ORAL | Status: DC
  Administered 2016-01-21 – 2016-01-24 (×4): 20 mg via ORAL
  Filled 2016-01-20 (×4): qty 1

## 2016-01-20 MED ORDER — NICOTINE 21 MG/24HR TD PT24
21.0000 mg | MEDICATED_PATCH | Freq: Every day | TRANSDERMAL | Status: DC
Start: 2016-01-21 — End: 2016-02-01
  Administered 2016-01-21 – 2016-02-01 (×12): 21 mg via TRANSDERMAL
  Filled 2016-01-20 (×12): qty 1

## 2016-01-20 MED ORDER — ASPIRIN 325 MG PO TABS
325.0000 mg | ORAL_TABLET | Freq: Every day | ORAL | Status: DC
Start: 2016-01-21 — End: 2016-02-02
  Administered 2016-01-21 – 2016-02-02 (×13): 325 mg via ORAL
  Filled 2016-01-20 (×13): qty 1

## 2016-01-20 MED ORDER — ATORVASTATIN CALCIUM 20 MG PO TABS
20.0000 mg | ORAL_TABLET | Freq: Every day | ORAL | Status: DC
  Administered 2016-01-20 – 2016-02-01 (×13): 20 mg via ORAL
  Filled 2016-01-20 (×13): qty 1

## 2016-01-20 MED ORDER — SENNOSIDES-DOCUSATE SODIUM 8.6-50 MG PO TABS: 1.0000 | ORAL_TABLET | Freq: Every evening | ORAL | Status: DC | PRN

## 2016-01-20 MED ORDER — POTASSIUM CHLORIDE CRYS ER 20 MEQ PO TBCR
40.0000 meq | EXTENDED_RELEASE_TABLET | Freq: Once | ORAL | Status: AC
  Administered 2016-01-20: 40 meq via ORAL
  Filled 2016-01-20: qty 2

## 2016-01-20 MED ORDER — ENOXAPARIN SODIUM 40 MG/0.4ML ~~LOC~~ SOLN: 40.0000 mg | SUBCUTANEOUS | Status: DC

## 2016-01-20 MED ORDER — ONDANSETRON HCL 4 MG/2ML IJ SOLN: 4.0000 mg | Freq: Four times a day (QID) | INTRAMUSCULAR | Status: DC | PRN

## 2016-01-20 MED ORDER — ACETAMINOPHEN 650 MG RE SUPP: 650.0000 mg | RECTAL | Status: DC | PRN

## 2016-01-20 MED ORDER — ENOXAPARIN SODIUM 40 MG/0.4ML ~~LOC~~ SOLN
40.0000 mg | SUBCUTANEOUS | Status: DC
  Administered 2016-01-21 – 2016-02-01 (×12): 40 mg via SUBCUTANEOUS
  Filled 2016-01-20 (×12): qty 0.4

## 2016-01-20 MED ORDER — HYDROCHLOROTHIAZIDE 12.5 MG PO CAPS
12.5000 mg | ORAL_CAPSULE | Freq: Every day | ORAL | Status: DC
  Administered 2016-01-21 – 2016-01-26 (×6): 12.5 mg via ORAL
  Filled 2016-01-20 (×6): qty 1

## 2016-01-20 MED ORDER — INSULIN ASPART 100 UNIT/ML ~~LOC~~ SOLN
0.0000 [IU] | Freq: Three times a day (TID) | SUBCUTANEOUS | Status: DC
  Administered 2016-01-21 – 2016-01-28 (×10): 1 [IU] via SUBCUTANEOUS
  Administered 2016-01-29: 2 [IU] via SUBCUTANEOUS
  Administered 2016-01-30 – 2016-02-01 (×4): 1 [IU] via SUBCUTANEOUS

## 2016-01-20 MED ORDER — TRAMADOL HCL 50 MG PO TABS: 50.0000 mg | ORAL_TABLET | Freq: Four times a day (QID) | ORAL | Status: DC | PRN

## 2016-01-20 NOTE — Progress Notes (Signed)
Patient arrived on unit. Oriented to rehab, given educational materials. Questions answered. Verbalized understanding of fall precautions. Wife and daughter at bedside.

## 2016-01-20 NOTE — H&P (Signed)
Physical Medicine and Rehabilitation Admission H&P       Chief Complaint  Patient presents with  . Weakness  : HPI: [name] Penais a 50 y.o.right handed malewith history of hypertension, tobacco abuse, Chronic renal insufficiency with baseline creatinine 1.3-1.4 and hyperlipidemia. Per chart review patient lives with spouse in independentprior to admission ambulating without assistive device. 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 Presented back 01/17/2016 with increasing right sided weakness as well as right facial droopand slurred speech. Patient did not receive TPA. MRI of the brain did not show any new areas of infarct suspect evolution of prior pontine infarct. MRA of head and angiogram of neck showed no significant carotid stenosis in the neck. 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 Neurology follow-up suspect evolution of left pontine infarct. Patient remains on aspirin therapy. Subcutaneous Lovenox added for DVT prophylaxis. Newly diagnosed diabetes mellitus with hemoglobin A1c 8.5 obtained obtained on sliding scale insulin. Physical therapy evaluation completed 01/18/2016 and patient now requiring moderate assist for ambulation. Request made for physical medicine rehabilitation consult.Patient was admitted for a comprehensive rehabilitation program  Patient denies any swallowing problems. He still has some slurring of speech. According to the wife. Starting to move the right upper extremity better. Still no motion at the right foot and ankle area. Discussed usual pattern of recovery, status post stroke  ROS Constitutional: Negative for chillsand fever.  HENT: Negative for hearing loss.   Eyes: Negative for blurred visionand double vision.  Respiratory: Negative for coughand shortness of breath.  Cardiovascular: Negative for chest pain, palpitationsand leg swelling.  Gastrointestinal: Positive for constipation. Negative for nauseaand vomiting.  Genitourinary: Negative for dysuriaand hematuria.  Musculoskeletal: Positive for myalgias.  Skin: Negative for rash.  Neurological: Positive for speech change, focal weaknessand weakness. Negative for sensory change, seizures, loss of consciousnessand headaches.  All other systems reviewed and are negative   Past Medical History:  Diagnosis Date  . Allergy   . Hernia, umbilical   . Hyperlipidemia   . Hypertension         Past Surgical History:  Procedure Laterality Date  . UVULOPALATOPHARYNGOPLASTY    . VARICOSE VEIN SURGERY          Family History  Problem Relation Age of Onset  . Heart attack Father    Social History:  reports that he has been smoking.  He has been smoking about 2.00 packs per day. He has never used smokeless tobacco. He reports that he drinks alcohol. He reports that he does not use drugs. Allergies:      Allergies  Allergen Reactions  . Morphine And Related Itching         Medications Prior to Admission  Medication Sig Dispense Refill  . aspirin 325 MG tablet Take 1 tablet (325 mg total) by mouth daily. 30 tablet 0  . atorvastatin (LIPITOR) 20 MG tablet Take 1 tablet (20 mg total) by mouth daily at 6 PM. 30 tablet 0  . lisinopril-hydrochlorothiazide (ZESTORETIC) 20-12.5 MG per tablet Take 1 tablet by mouth daily. 90 tablet 3  . fluticasone (FLONASE) 50 MCG/ACT nasal spray Place 1-2 sprays into both nostrils daily as needed for rhinitis.       Home: Home Living Family/patient expects to be discharged to:: Private  residence Living Arrangements: Spouse/significant other Available Help at Discharge: Family, Friend(s), Available PRN/intermittently Type of Home:  House Home Access: Stairs to enter Technical brewer of Steps: 3 Entrance Stairs-Rails: Right Home Layout: Two level Alternate Level Stairs-Number of Steps: flight Alternate Level Stairs-Rails: Left (then changes to R) Home Equipment: None Additional Comments: has x2 pug dogs in the home that are wife's.   Lives With: Family   Functional History: Prior Function Level of Independence: Independent Comments: travels for work, pt did have R hand residual deficits/difficulty writing as he is R handed from TIA over the weekend  Functional Status:  Mobility: Bed Mobility General bed mobility comments: sitting in chair with chair alarm Transfers Overall transfer level: Needs assistance Equipment used: None Transfers: Sit to/from Stand Sit to Stand: Min guard General transfer comment: unable to use R UE functionally, used momentum to bring self up into standing, impulsively quick, leaning more to the L due to R sided weakness Ambulation/Gait Ambulation/Gait assistance: Mod assist Ambulation Distance (Feet): 75 Feet (x2, 1 trial without ace wrap and 1 trial with wrap) Assistive device: 1 person hand held assist Gait Pattern/deviations: Step-through pattern, Decreased dorsiflexion - right, Decreased stride length, Narrow base of support General Gait Details: Pt required mod hand held A to maintain balance due to increased weight shift to L for clearance of R foot. Ace wrap to assist with R DF. Verbal ques to focus on R foot placement and to decrease excessive extension of R knee.    Gait velocity interpretation: Below normal speed for age/gender    ADL: ADL Overall ADL's : Needs assistance/impaired Grooming: Wash/dry hands, Min guard, Standing Toilet Transfer: Min guard, Ambulation, Regular Glass blower/designer Details (indicate cue type and reason): needed cues to safety and R LE positioning. pt with hyperextension at the knee.  Functional mobility during ADLs: Min  guard General ADL Comments: pt grabbing to door frames and reaching for other environmental supports at this time. pt with poor recall of R foot drop. pt when cues states "they put something on it before" Pt when completing sit<.stand lateral leans Lt and powers up with L side of body. pt provided exercises for UB and LB. pt educated on knee extension, knee flexion, glut squeeze, wrist flexion/ extension, digit flexion/ extension, elbow flexion , extension at this time.   Cognition: Cognition Overall Cognitive Status: Impaired/Different from baseline Orientation Level: Oriented X4 Cognition Arousal/Alertness: Awake/alert Behavior During Therapy: Impulsive Overall Cognitive Status: Impaired/Different from baseline Area of Impairment: Safety/judgement, Awareness Safety/Judgement: Decreased awareness of safety, Decreased awareness of deficits Awareness: Anticipatory General Comments: Pt reports "that he is fine" but when asked further defines changes to his R side. pt reports no need for help to the bathroom with lack of awareness to R foot drop and decr ability to power up with R side. Knocking object off sink surface without sensation to object and only hearing the object land and wife state "what did you knock off ? oh it was only my phone and cards and money" with half joking tone. Pt attempting to provide reasoning to all education and how only deficit is his speech with poor awareness to other deficits.   Physical Exam: Blood pressure (!) 149/80, pulse 79, temperature 98.2 F (36.8 C), temperature source Oral, resp. rate 20, height 5' 10" (1.778 m), weight 117.9 kg (260 lb), SpO2 98 %. Physical Exam Constitutional: He is oriented to person, place, and time. He appears well-developedand well-nourished.  HENT:  Head: Normocephalicand atraumatic.  Eyes: Conjunctivaeand EOMare normal.  Neck: Normal range of motion. Neck supple. No thyromegalypresent.  Cardiovascular: Normal rateand  regular rhythm.  Respiratory: Effort normaland breath sounds normal. No respiratory distress.  GI: Soft. Bowel sounds are normal. He exhibits no distension.  Musculoskeletal: He exhibits no edemaor tenderness.  Neurological: He is alertand oriented to person, place, and time.  Dysarthria speech but intelligible.  Good awareness of deficits Right hemifacial wekaness Sensation intact to light touch DTRs symmetric Motor: RUE: 2/5 RLE: Hip flexion 2+/5, knee extension 4/5, ankle dorsi/plantar flexion 0/5 LUE/LLE: 5/5 proximal to distal Skin: Skin is warmand dry.  Psychiatric: He has a normal mood and affect. His behavior is normal. Thought contentnormal   Lab Results Last 48 Hours        Results for orders placed or performed during the hospital encounter of 01/17/16 (from the past 48 hour(s))  Protime-INR     Status: None   Collection Time: 01/17/16 10:23 AM  Result Value Ref Range   Prothrombin Time 12.4 11.4 - 15.2 seconds   INR 0.92   APTT     Status: None   Collection Time: 01/17/16 10:23 AM  Result Value Ref Range   aPTT 25 24 - 36 seconds  CBC with Differential/Platelet     Status: None   Collection Time: 01/17/16 10:24 AM  Result Value Ref Range   WBC 9.1 4.0 - 10.5 K/uL   RBC 4.85 4.22 - 5.81 MIL/uL   Hemoglobin 15.3 13.0 - 17.0 g/dL   HCT 46.1 39.0 - 52.0 %   MCV 95.1 78.0 - 100.0 fL   MCH 31.5 26.0 - 34.0 pg   MCHC 33.2 30.0 - 36.0 g/dL   RDW 13.4 11.5 - 15.5 %   Platelets 154 150 - 400 K/uL   Neutrophils Relative % 73 %   Neutro Abs 6.6 1.7 - 7.7 K/uL   Lymphocytes Relative 18 %   Lymphs Abs 1.6 0.7 - 4.0 K/uL   Monocytes Relative 6 %   Monocytes Absolute 0.6 0.1 - 1.0 K/uL   Eosinophils Relative 3 %   Eosinophils Absolute 0.2 0.0 - 0.7 K/uL   Basophils Relative 0 %   Basophils Absolute 0.0 0.0 - 0.1 K/uL  Comprehensive metabolic panel     Status: Abnormal   Collection Time: 01/17/16 10:24 AM  Result Value Ref Range    Sodium 137 135 - 145 mmol/L   Potassium 3.6 3.5 - 5.1 mmol/L   Chloride 105 101 - 111 mmol/L   CO2 24 22 - 32 mmol/L   Glucose, Bld 177 (H) 65 - 99 mg/dL   BUN 17 6 - 20 mg/dL   Creatinine, Ser 0.77 0.61 - 1.24 mg/dL   Calcium 8.7 (L) 8.9 - 10.3 mg/dL   Total Protein 6.2 (L) 6.5 - 8.1 g/dL   Albumin 3.4 (L) 3.5 - 5.0 g/dL   AST 25 15 - 41 U/L   ALT 26 17 - 63 U/L   Alkaline Phosphatase 55 38 - 126 U/L   Total Bilirubin 0.8 0.3 - 1.2 mg/dL   GFR calc non Af Amer >60 >60 mL/min   GFR calc Af Amer >60 >60 mL/min    Comment: (NOTE) The eGFR has been calculated using the CKD EPI equation. This calculation has not been validated in all clinical situations. eGFR's persistently <60 mL/min signify possible Chronic Kidney Disease.   Anion gap 8 5 - 15  Glucose, capillary     Status: Abnormal   Collection Time:  01/17/16  5:31 PM  Result Value Ref Range   Glucose-Capillary 215 (H) 65 - 99 mg/dL   Comment 1 Notify RN    Comment 2 Document in Chart   Glucose, capillary     Status: Abnormal   Collection Time: 01/17/16 10:47 PM  Result Value Ref Range   Glucose-Capillary 103 (H) 65 - 99 mg/dL   Comment 1 Notify RN    Comment 2 Document in Chart   Glucose, capillary     Status: Abnormal   Collection Time: 01/18/16  6:31 AM  Result Value Ref Range   Glucose-Capillary 167 (H) 65 - 99 mg/dL   Comment 1 Notify RN    Comment 2 Document in Chart   Glucose, capillary     Status: Abnormal   Collection Time: 01/18/16 11:15 AM  Result Value Ref Range   Glucose-Capillary 149 (H) 65 - 99 mg/dL  Glucose, capillary     Status: Abnormal   Collection Time: 01/18/16  4:18 PM  Result Value Ref Range   Glucose-Capillary 266 (H) 65 - 99 mg/dL  Glucose, capillary     Status: Abnormal   Collection Time: 01/19/16  6:01 AM  Result Value Ref Range   Glucose-Capillary 135 (H) 65 - 99 mg/dL   Comment 1 Notify RN    Comment 2 Document in Chart       Imaging  Results (Last 48 hours)  Dg Chest 2 View  Result Date: 01/17/2016 CLINICAL DATA:  Patient admitted with a stroke on 01/14/2016. Inability to move the right arm and severe right leg weakness today. Slurred speech. EXAM: CHEST  2 VIEW COMPARISON:  None. FINDINGS: The lungs are clear. Heart size is upper normal. No pneumothorax or pleural effusion. No focal bony abnormality. IMPRESSION: No acute disease. Electronically Signed   By: Inge Rise M.D.   On: 01/17/2016 16:35   Ct Head Wo Contrast  Result Date: 01/17/2016 CLINICAL DATA:  50 year old male with worsening right arm weakness and right facial droop since yesterday. Recent stroke. Subsequent encounter. EXAM: CT HEAD WITHOUT CONTRAST TECHNIQUE: Contiguous axial images were obtained from the base of the skull through the vertex without intravenous contrast. COMPARISON:  01/15/2016 MR. 01/14/2016 CT angiogram head and neck and CT. FINDINGS: Brain: Acute left lower pontine infarct may have progressed since prior exam and is more conspicuous on the present CT. Chronic microvascular CT moderate chronic microvascular changes. No intracranial hemorrhage. Prominent basal ganglia calcifications/mineralization unchanged. No hydrocephalus. No intracranial mass lesion noted on this unenhanced exam. Vascular: No hyperdense major vessel. Skull: No acute abnormality. Sinuses/Orbits: Moderate opacification right maxillary sinus. Minimal mucosal thickening left maxillary sinus. Partial opacification ethmoid sinus air cells bilaterally. Other: Negative. IMPRESSION: Acute left lower pontine infarct may have progressed since prior exam and is more conspicuous on the present CT. Moderate microvascular changes. No intracranial hemorrhage. Moderate opacification right maxillary sinus. Minimal mucosal thickening left maxillary sinus. Partial opacification ethmoid sinus air cells bilaterally. Electronically Signed   By: Genia Del M.D.   On: 01/17/2016 10:36   Mr  Angiogram Neck W Wo Contrast  Result Date: 01/18/2016 CLINICAL DATA:  Recent left pontine stroke with worsening symptoms of right-sided weakness. EXAM: MR HEAD WITHOUT CONTRAST MR CIRCLE OF WILLIS WITHOUT CONTRAST MRA OF THE NECK WITHOUT AND WITH CONTRAST TECHNIQUE: Multiplanar, multiecho pulse sequences of the brain and surrounding structures were obtained according to standard protocol without intravenous contrast.; Multiplanar and multiecho pulse sequences of the neck were obtained without and with intravenous contrast.  Angiographic images of the neck were obtained using MRA technique without and with intravenous contast.; Angiographic images of the Circle of Willis were obtained using MRA technique without intravenous contrast. CONTRAST:  2m MULTIHANCE GADOBENATE DIMEGLUMINE 529 MG/ML IV SOLN COMPARISON:  MRI 01/15/2016.  CTA 01/14/2016 FINDINGS: MR HEAD FINDINGS Restricted diffusion in the left lower ventral pons is unchanged in size from the recent MRI. No new area of acute infarct since the prior study. Generalized atrophy. Moderate chronic microvascular ischemic change throughout the white matter. Chronic lacunar infarction left middle cerebellar peduncle unchanged. Negative for hemorrhage or mass.  No shift of the midline structures Mucosal edema throughout the paranasal sinuses right greater than left unchanged. Pituitary not enlarged. Normal orbital structures. MR CIRCLE OF WILLIS FINDINGS Image quality degraded by mild motion. There is misregistration in the mid basilar and internal carotid artery due to patient motion Both vertebral arteries patent to the basilar with mild stenosis distally in both vertebral arteries. Basilar widely patent. Superior cerebellar and posterior cerebral arteries patent. Fetal origin left posterior cerebral artery. Left PICA patent. Right PICA not visualized. Internal carotid artery patent bilaterally without stenosis. Anterior and middle cerebral arteries patent  bilaterally without stenosis or branch occlusion. Negative for cerebral vascular malformation. MRA NECK FINDINGS Carotid bifurcation widely patent bilaterally without significant stenosis. Mild atherosclerotic disease in the left carotid bulb. Both vertebral arteries patent to the basilar. Moderate stenosis at the origin of the vertebral artery bilaterally. Mild stenosis distal vertebral artery bilaterally. IMPRESSION: Restricted diffusion left pons unchanged from the recent MRI compatible with subacute infarct. No new area of infarct since the prior MRI of 01/14/2014. Moderate chronic microvascular ischemic change. 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 Basilar is patent. Electronically Signed   By: CFranchot GalloM.D.   On: 01/18/2016 11:15   Mr Brain Wo Contrast  Result Date: 01/18/2016 CLINICAL DATA:  Recent left pontine stroke with worsening symptoms of right-sided weakness. EXAM: MR HEAD WITHOUT CONTRAST MR CIRCLE OF WILLIS WITHOUT CONTRAST MRA OF THE NECK WITHOUT AND WITH CONTRAST TECHNIQUE: Multiplanar, multiecho pulse sequences of the brain and surrounding structures were obtained according to standard protocol without intravenous contrast.; Multiplanar and multiecho pulse sequences of the neck were obtained without and with intravenous contrast. Angiographic images of the neck were obtained using MRA technique without and with intravenous contast.; Angiographic images of the Circle of Willis were obtained using MRA technique without intravenous contrast. CONTRAST:  266mMULTIHANCE GADOBENATE DIMEGLUMINE 529 MG/ML IV SOLN COMPARISON:  MRI 01/15/2016.  CTA 01/14/2016 FINDINGS: MR HEAD FINDINGS Restricted diffusion in the left lower ventral pons is unchanged in size from the recent MRI. No new area of acute infarct since the prior study. Generalized atrophy. Moderate chronic  microvascular ischemic change throughout the white matter. Chronic lacunar infarction left middle cerebellar peduncle unchanged. Negative for hemorrhage or mass.  No shift of the midline structures Mucosal edema throughout the paranasal sinuses right greater than left unchanged. Pituitary not enlarged. Normal orbital structures. MR CIRCLE OF WILLIS FINDINGS Image quality degraded by mild motion. There is misregistration in the mid basilar and internal carotid artery due to patient motion Both vertebral arteries patent to the basilar with mild stenosis distally in both vertebral arteries. Basilar widely patent. Superior cerebellar and posterior cerebral arteries patent. Fetal origin left posterior cerebral artery. Left PICA patent. Right PICA not visualized. Internal carotid artery patent bilaterally  without stenosis. Anterior and middle cerebral arteries patent bilaterally without stenosis or branch occlusion. Negative for cerebral vascular malformation. MRA NECK FINDINGS Carotid bifurcation widely patent bilaterally without significant stenosis. Mild atherosclerotic disease in the left carotid bulb. Both vertebral arteries patent to the basilar. Moderate stenosis at the origin of the vertebral artery bilaterally. Mild stenosis distal vertebral artery bilaterally. IMPRESSION: Restricted diffusion left pons unchanged from the recent MRI compatible with subacute infarct. No new area of infarct since the prior MRI of 01/14/2014. Moderate chronic microvascular ischemic change. 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 Basilar is patent. Electronically Signed   By: Franchot Gallo M.D.   On: 01/18/2016 11:15   Mr Jodene Nam Head/brain UT Cm  Result Date: 01/18/2016 CLINICAL DATA:  Recent left pontine stroke with worsening symptoms of right-sided weakness. EXAM: MR HEAD WITHOUT CONTRAST MR CIRCLE OF  WILLIS WITHOUT CONTRAST MRA OF THE NECK WITHOUT AND WITH CONTRAST TECHNIQUE: Multiplanar, multiecho pulse sequences of the brain and surrounding structures were obtained according to standard protocol without intravenous contrast.; Multiplanar and multiecho pulse sequences of the neck were obtained without and with intravenous contrast. Angiographic images of the neck were obtained using MRA technique without and with intravenous contast.; Angiographic images of the Circle of Willis were obtained using MRA technique without intravenous contrast. CONTRAST:  18m MULTIHANCE GADOBENATE DIMEGLUMINE 529 MG/ML IV SOLN COMPARISON:  MRI 01/15/2016.  CTA 01/14/2016 FINDINGS: MR HEAD FINDINGS Restricted diffusion in the left lower ventral pons is unchanged in size from the recent MRI. No new area of acute infarct since the prior study. Generalized atrophy. Moderate chronic microvascular ischemic change throughout the white matter. Chronic lacunar infarction left middle cerebellar peduncle unchanged. Negative for hemorrhage or mass.  No shift of the midline structures Mucosal edema throughout the paranasal sinuses right greater than left unchanged. Pituitary not enlarged. Normal orbital structures. MR CIRCLE OF WILLIS FINDINGS Image quality degraded by mild motion. There is misregistration in the mid basilar and internal carotid artery due to patient motion Both vertebral arteries patent to the basilar with mild stenosis distally in both vertebral arteries. Basilar widely patent. Superior cerebellar and posterior cerebral arteries patent. Fetal origin left posterior cerebral artery. Left PICA patent. Right PICA not visualized. Internal carotid artery patent bilaterally without stenosis. Anterior and middle cerebral arteries patent bilaterally without stenosis or branch occlusion. Negative for cerebral vascular malformation. MRA NECK FINDINGS Carotid bifurcation widely patent bilaterally without significant stenosis. Mild  atherosclerotic disease in the left carotid bulb. Both vertebral arteries patent to the basilar. Moderate stenosis at the origin of the vertebral artery bilaterally. Mild stenosis distal vertebral artery bilaterally. IMPRESSION: Restricted diffusion left pons unchanged from the recent MRI compatible with subacute infarct. No new area of infarct since the prior MRI of 01/14/2014. Moderate chronic microvascular ischemic change. 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 Basilar is patent. Electronically Signed   By: CFranchot GalloM.D.   On: 01/18/2016 11:15        Medical Problem List and Plan: 1.  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  DVT Prophylaxis/Anticoagulation: Subcutaneous Lovenox. Monitor platelet counts of any signs of bleeding 3. Pain Management: Tylenol as needed 4. Hypertension. Lisinopril 20 mg daily, HCTZ 12.5 mg daily.  Monitor with increased mobility 5. Neuropsych: This patient is capable of making decisions on his own behalf. 6. Skin/Wound Care: Routine skin checks 7. Fluids/Electrolytes/Nutrition: Routine I&O's with follow-up chemistries 8. Newly diagnosed diabetes mellitus. Hemoglobin A1c 8.5. Check blood sugars before meals and at bedtime. Provide diabetic teaching. 9. Chronic renal insufficiency. Baseline creatinine 1.3-1.4. Follow-up chemistries 10. Hyperlipidemia. Lipitor 11. Tobacco abuse. Nicoderm patch. Provide counseling   Post Admission Physician Evaluation: 1. Functional deficits secondary  to right hemiplegia. 2. Patient is admitted to receive collaborative, interdisciplinary care between the physiatrist, rehab nursing staff, and therapy team. 3. Patient's level of medical complexity and substantial therapy needs in context of that medical necessity cannot  be provided at a lesser intensity of care such as a SNF. 4. Patient has experienced substantial functional loss from his/her baseline which was documented above under the "Functional History" and "Functional Status" headings.  Judging by the patient's diagnosis, physical exam, and functional history, the patient has potential for functional progress which will result in measurable gains while on inpatient rehab.  These gains will be of substantial and practical use upon discharge  in facilitating mobility and self-care at the household level. 5. Physiatrist will provide 24 hour management of medical needs as well as oversight of the therapy plan/treatment and provide guidance as appropriate regarding the interaction of the two. 6. 24 hour rehab nursing will assist with bladder management, bowel management, safety, skin/wound care, disease management, medication administration, pain management and patient education  and help integrate therapy concepts, techniques,education, etc. 7. PT will assess and treat for/with: pre gait, gait training, endurance , safety, equipment, neuromuscular re education.   Goals are: Mod I. 8. OT will assess and treat for/with: ADLs, Cognitive perceptual skills, Neuromuscular re education, safety, endurance, equipment.   Goals are: ModI. Therapy may proceed with showering this patient. 9. SLP will assess and treat for/with: dysarthria.  Goals are: 100% speech intelligiblity. 10. Case Management and Social Worker will assess and treat for psychological issues and discharge planning. 11. Team conference will be held weekly to assess progress toward goals and to determine barriers to discharge. 12. Patient will receive at least 3 hours of therapy per day at least 5 days per week. 13. ELOS: 10-14d       14. Prognosis:  excellent     Charlett Blake M.D. [facility] FAAPM&R (Sports Med, Neuromuscular Med) Diplomate Am Board of Electrodiagnostic  Med  01/20/2016

## 2016-01-20 NOTE — Progress Notes (Signed)
Patient refuses bed alarm to be activated. Patient alert and oriented x 4, demonstrating good judgement and called for assistance every time he ambulated throughout the night. Patient stated to RN that he "would sign whatever piece of paper needed but you are not turning that thing on." Patient does have slip resistant socks on. RN re-educated patient on safety policy. Patient v/u. RN will monitor closely.

## 2016-01-20 NOTE — Consult Note (Signed)
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 is a 50 y.o. right handed male with history of hypertension, tobacco abuse and hyperlipidemia. Per chart review patient lives with spouse in independent prior to admission ambulating without assistive device. 2 level home with 3 steps to entry and bedroom upstairs. Wife works during the day. Patient recently admitted to the hospital with a small left pontine infarct 01/15/2016 discharged to home 01/16/2016 at supervision level maintained on aspirin. Presented back 01/17/2016 with increasing right sided weakness as well as right facial droop and slurred speech. Patient did not receive TPA. MRI of the brain did not show any new areas of infarct suspect evolution of prior pontine infarct. MRA of [name] and angiogram of neck showed no significant carotid stenosis in the neck. 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 Neurology follow-up suspect evolution of left pontine infarct. Patient remains on aspirin therapy. Subtenon's Lovenox added for DVT prophylaxis. Physical therapy evaluation completed 01/18/2016 and patient now requiring moderate assist for ambulation. Request made for physical medicine rehabilitation consult.   Review of Systems  Constitutional: Negative for chills and fever.  HENT: Negative for hearing loss.   Eyes: Negative for blurred vision and double vision.  Respiratory: Negative for  cough and shortness of breath.   Cardiovascular: Negative for chest pain, palpitations and leg swelling.  Gastrointestinal: Positive for constipation. Negative for nausea and vomiting.  Genitourinary: Negative for dysuria and hematuria.  Musculoskeletal: Positive for myalgias.  Skin: Negative for rash.  Neurological: Positive for speech change, focal weakness and weakness. Negative for sensory change, seizures, loss of consciousness and headaches.  All other systems reviewed and are negative.      Past Medical History:  Diagnosis Date  . Allergy   . Hernia, umbilical   . Hyperlipidemia   . Hypertension         Past Surgical History:  Procedure Laterality Date  . UVULOPALATOPHARYNGOPLASTY    . VARICOSE VEIN SURGERY          Family History  Problem Relation Age of Onset  . Heart attack Father    Social History:  reports that he has been smoking.  He has been smoking about 2.00 packs per day. He has never used smokeless tobacco. He reports that he drinks alcohol. He reports that he does not use drugs. Allergies:      Allergies  Allergen Reactions  . Morphine And Related Itching         Medications Prior to Admission  Medication Sig Dispense Refill  . aspirin 325 MG tablet Take 1 tablet (325 mg total) by mouth daily. 30 tablet 0  . atorvastatin (LIPITOR) 20 MG tablet Take 1 tablet (20 mg total) by mouth daily at 6 PM. 30 tablet 0  . lisinopril-hydrochlorothiazide (ZESTORETIC) 20-12.5 MG per tablet Take 1 tablet by mouth daily. 90 tablet 3  . fluticasone (FLONASE) 50 MCG/ACT nasal spray Place 1-2 sprays into both nostrils daily as needed for rhinitis.  Home: Home Living Family/patient expects to be discharged to:: Private residence Living Arrangements: Spouse/significant other Available Help at Discharge: Family, Friend(s), Available PRN/intermittently Type of Home: House Home Access: Stairs to enter Secretary/administratorntrance Stairs-Number of Steps: 3 Entrance  Stairs-Rails: Right Home Layout: Two level Alternate Level Stairs-Number of Steps: flight Alternate Level Stairs-Rails: Left (then changes to R) Home Equipment: None  Lives With: Family  Functional History: Prior Function Level of Independence: Independent Comments: travels for work, pt did have R hand residual deficits/difficulty writing as he is R handed from TIA over the weekend Functional Status:  Mobility: Bed Mobility General bed mobility comments: pt found sitting EOB Transfers Overall transfer level: Needs assistance Equipment used: None Transfers: Sit to/from Stand Sit to Stand: Min assist General transfer comment: unable to use R UE functionally, used momentum to bring self up into standing, impulsively quick, leaning more to the L due to R sided weakness Ambulation/Gait Ambulation/Gait assistance: Mod assist Ambulation Distance (Feet): 75 Feet (x2, 1 trial without ace wrap and 1 trial with wrap) Assistive device: 1 person hand held assist Gait Pattern/deviations: Step-through pattern, Decreased dorsiflexion - right, Decreased stride length, Narrow base of support General Gait Details: Pt required mod hand held A to maintain balance due to increased weight shift to L for clearance of R foot. Ace wrap to assist with R DF. Verbal ques to focus on R foot placement and to decrease excessive extension of R knee.    Gait velocity interpretation: Below normal speed for age/gender    ADL:    Cognition: Cognition Overall Cognitive Status: Impaired/Different from baseline Orientation Level: Oriented X4 Cognition Arousal/Alertness: Awake/alert Behavior During Therapy: Impulsive Overall Cognitive Status: Impaired/Different from baseline Area of Impairment: Safety/judgement Safety/Judgement: Decreased awareness of safety, Decreased awareness of deficits General Comments: Pt is aware of R sided weakness. However pt is impulsive with movement and decreased insight to safety  regarding new physical deficits.   Blood pressure (!) 129/109, pulse 75, temperature 98.8 F (37.1 C), temperature source Oral, resp. rate 18, height 5\' 10"  (1.778 m), weight 117.9 kg (260 lb), SpO2 97 %. Physical Exam  Constitutional: He is oriented to person, place, and time. He appears well-developed and well-nourished.  HENT:  [name]: Normocephalic and atraumatic.  Eyes: Conjunctivae and EOM are normal.  Neck: Normal range of motion. Neck supple. No thyromegaly present.  Cardiovascular: Normal rate and regular rhythm.   Respiratory: Effort normal and breath sounds normal. No respiratory distress.  GI: Soft. Bowel sounds are normal. He exhibits no distension.  Musculoskeletal: He exhibits no edema or tenderness.  Neurological: He is alert and oriented to person, place, and time.  Dysarthria speech but intelligible.  Good awareness of deficits Right hemifacial wekaness Sensation intact to light touch DTRs symmetric Motor: RUE: 2/5 RLE: Hip flexion 2+/5, knee extension 4/5, ankle dorsi/plantar flexion 0/5 LUE/LLE: 5/5 proximal to distal  Skin: Skin is warm and dry.  Psychiatric: He has a normal mood and affect. His behavior is normal. Thought content normal.    Lab Results Last 24 Hours       Results for orders placed or performed during the hospital encounter of 01/17/16 (from the past 24 hour(s))  Glucose, capillary     Status: Abnormal   Collection Time: 01/17/16  5:31 PM  Result Value Ref Range   Glucose-Capillary 215 (H) 65 - 99 mg/dL   Comment 1 Notify RN    Comment 2 Document in Chart   Glucose, capillary     Status:  Abnormal   Collection Time: 01/17/16 10:47 PM  Result Value Ref Range   Glucose-Capillary 103 (H) 65 - 99 mg/dL   Comment 1 Notify RN    Comment 2 Document in Chart   Glucose, capillary     Status: Abnormal   Collection Time: 01/18/16  6:31 AM  Result Value Ref Range   Glucose-Capillary 167 (H) 65 - 99 mg/dL   Comment 1 Notify RN      Comment 2 Document in Chart   Glucose, capillary     Status: Abnormal   Collection Time: 01/18/16 11:15 AM  Result Value Ref Range   Glucose-Capillary 149 (H) 65 - 99 mg/dL      Imaging Results (Last 48 hours)  Dg Chest 2 View  Result Date: 01/17/2016 CLINICAL DATA:  Patient admitted with a stroke on 01/14/2016. Inability to move the right arm and severe right leg weakness today. Slurred speech. EXAM: CHEST  2 VIEW COMPARISON:  None. FINDINGS: The lungs are clear. Heart size is upper normal. No pneumothorax or pleural effusion. No focal bony abnormality. IMPRESSION: No acute disease. Electronically Signed   By: Drusilla Kanner M.D.   On: 01/17/2016 16:35   Ct [name] Wo Contrast  Result Date: 01/17/2016 CLINICAL DATA:  50 year old male with worsening right arm weakness and right facial droop since yesterday. Recent stroke. Subsequent encounter. EXAM: CT [name] WITHOUT CONTRAST TECHNIQUE: Contiguous axial images were obtained from the base of the skull through the vertex without intravenous contrast. COMPARISON:  01/15/2016 MR. 01/14/2016 CT angiogram [name] and neck and CT. FINDINGS: Brain: Acute left lower pontine infarct may have progressed since prior exam and is more conspicuous on the present CT. Chronic microvascular CT moderate chronic microvascular changes. No intracranial hemorrhage. Prominent basal ganglia calcifications/mineralization unchanged. No hydrocephalus. No intracranial mass lesion noted on this unenhanced exam. Vascular: No hyperdense major vessel. Skull: No acute abnormality. Sinuses/Orbits: Moderate opacification right maxillary sinus. Minimal mucosal thickening left maxillary sinus. Partial opacification ethmoid sinus air cells bilaterally. Other: Negative. IMPRESSION: Acute left lower pontine infarct may have progressed since prior exam and is more conspicuous on the present CT. Moderate microvascular changes. No intracranial hemorrhage. Moderate opacification right  maxillary sinus. Minimal mucosal thickening left maxillary sinus. Partial opacification ethmoid sinus air cells bilaterally. Electronically Signed   By: Lacy Duverney M.D.   On: 01/17/2016 10:36   Mr Angiogram Neck W Wo Contrast  Result Date: 01/18/2016 CLINICAL DATA:  Recent left pontine stroke with worsening symptoms of right-sided weakness. EXAM: MR [name] WITHOUT CONTRAST MR CIRCLE OF WILLIS WITHOUT CONTRAST MRA OF THE NECK WITHOUT AND WITH CONTRAST TECHNIQUE: Multiplanar, multiecho pulse sequences of the brain and surrounding structures were obtained according to standard protocol without intravenous contrast.; Multiplanar and multiecho pulse sequences of the neck were obtained without and with intravenous contrast. Angiographic images of the neck were obtained using MRA technique without and with intravenous contast.; Angiographic images of the Circle of Willis were obtained using MRA technique without intravenous contrast. CONTRAST:  20mL MULTIHANCE GADOBENATE DIMEGLUMINE 529 MG/ML IV SOLN COMPARISON:  MRI 01/15/2016.  CTA 01/14/2016 FINDINGS: MR [name] FINDINGS Restricted diffusion in the left lower ventral pons is unchanged in size from the recent MRI. No new area of acute infarct since the prior study. Generalized atrophy. Moderate chronic microvascular ischemic change throughout the white matter. Chronic lacunar infarction left middle cerebellar peduncle unchanged. Negative for hemorrhage or mass.  No shift of the midline structures Mucosal edema throughout the paranasal sinuses right greater than  left unchanged. Pituitary not enlarged. Normal orbital structures. MR CIRCLE OF WILLIS FINDINGS Image quality degraded by mild motion. There is misregistration in the mid basilar and internal carotid artery due to patient motion Both vertebral arteries patent to the basilar with mild stenosis distally in both vertebral arteries. Basilar widely patent. Superior cerebellar and posterior cerebral arteries patent.  Fetal origin left posterior cerebral artery. Left PICA patent. Right PICA not visualized. Internal carotid artery patent bilaterally without stenosis. Anterior and middle cerebral arteries patent bilaterally without stenosis or branch occlusion. Negative for cerebral vascular malformation. MRA NECK FINDINGS Carotid bifurcation widely patent bilaterally without significant stenosis. Mild atherosclerotic disease in the left carotid bulb. Both vertebral arteries patent to the basilar. Moderate stenosis at the origin of the vertebral artery bilaterally. Mild stenosis distal vertebral artery bilaterally. IMPRESSION: Restricted diffusion left pons unchanged from the recent MRI compatible with subacute infarct. No new area of infarct since the prior MRI of 01/14/2014. Moderate chronic microvascular ischemic change. 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 Basilar is patent. Electronically Signed   By: Marlan Palau M.D.   On: 01/18/2016 11:15   Mr Brain Wo Contrast  Result Date: 01/18/2016 CLINICAL DATA:  Recent left pontine stroke with worsening symptoms of right-sided weakness. EXAM: MR [name] WITHOUT CONTRAST MR CIRCLE OF WILLIS WITHOUT CONTRAST MRA OF THE NECK WITHOUT AND WITH CONTRAST TECHNIQUE: Multiplanar, multiecho pulse sequences of the brain and surrounding structures were obtained according to standard protocol without intravenous contrast.; Multiplanar and multiecho pulse sequences of the neck were obtained without and with intravenous contrast. Angiographic images of the neck were obtained using MRA technique without and with intravenous contast.; Angiographic images of the Circle of Willis were obtained using MRA technique without intravenous contrast. CONTRAST:  20mL MULTIHANCE GADOBENATE DIMEGLUMINE 529 MG/ML IV SOLN COMPARISON:  MRI 01/15/2016.  CTA 01/14/2016 FINDINGS: MR [name]  FINDINGS Restricted diffusion in the left lower ventral pons is unchanged in size from the recent MRI. No new area of acute infarct since the prior study. Generalized atrophy. Moderate chronic microvascular ischemic change throughout the white matter. Chronic lacunar infarction left middle cerebellar peduncle unchanged. Negative for hemorrhage or mass.  No shift of the midline structures Mucosal edema throughout the paranasal sinuses right greater than left unchanged. Pituitary not enlarged. Normal orbital structures. MR CIRCLE OF WILLIS FINDINGS Image quality degraded by mild motion. There is misregistration in the mid basilar and internal carotid artery due to patient motion Both vertebral arteries patent to the basilar with mild stenosis distally in both vertebral arteries. Basilar widely patent. Superior cerebellar and posterior cerebral arteries patent. Fetal origin left posterior cerebral artery. Left PICA patent. Right PICA not visualized. Internal carotid artery patent bilaterally without stenosis. Anterior and middle cerebral arteries patent bilaterally without stenosis or branch occlusion. Negative for cerebral vascular malformation. MRA NECK FINDINGS Carotid bifurcation widely patent bilaterally without significant stenosis. Mild atherosclerotic disease in the left carotid bulb. Both vertebral arteries patent to the basilar. Moderate stenosis at the origin of the vertebral artery bilaterally. Mild stenosis distal vertebral artery bilaterally. IMPRESSION: Restricted diffusion left pons unchanged from the recent MRI compatible with subacute infarct. No new area of infarct since the prior MRI of 01/14/2014. Moderate chronic microvascular ischemic change. 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 Basilar  is patent. Electronically Signed   By: Marlan Palau M.D.   On: 01/18/2016 11:15    Mr [name] [name]/brain WU Cm  Result Date: 01/18/2016 CLINICAL DATA:  Recent left pontine stroke with worsening symptoms of right-sided weakness. EXAM: MR [name] WITHOUT CONTRAST MR CIRCLE OF WILLIS WITHOUT CONTRAST MRA OF THE NECK WITHOUT AND WITH CONTRAST TECHNIQUE: Multiplanar, multiecho pulse sequences of the brain and surrounding structures were obtained according to standard protocol without intravenous contrast.; Multiplanar and multiecho pulse sequences of the neck were obtained without and with intravenous contrast. Angiographic images of the neck were obtained using MRA technique without and with intravenous contast.; Angiographic images of the Circle of Willis were obtained using MRA technique without intravenous contrast. CONTRAST:  20mL MULTIHANCE GADOBENATE DIMEGLUMINE 529 MG/ML IV SOLN COMPARISON:  MRI 01/15/2016.  CTA 01/14/2016 FINDINGS: MR [name] FINDINGS Restricted diffusion in the left lower ventral pons is unchanged in size from the recent MRI. No new area of acute infarct since the prior study. Generalized atrophy. Moderate chronic microvascular ischemic change throughout the white matter. Chronic lacunar infarction left middle cerebellar peduncle unchanged. Negative for hemorrhage or mass.  No shift of the midline structures Mucosal edema throughout the paranasal sinuses right greater than left unchanged. Pituitary not enlarged. Normal orbital structures. MR CIRCLE OF WILLIS FINDINGS Image quality degraded by mild motion. There is misregistration in the mid basilar and internal carotid artery due to patient motion Both vertebral arteries patent to the basilar with mild stenosis distally in both vertebral arteries. Basilar widely patent. Superior cerebellar and posterior cerebral arteries patent. Fetal origin left posterior cerebral artery. Left PICA patent. Right PICA not visualized. Internal carotid artery patent bilaterally without stenosis. Anterior and middle cerebral arteries patent  bilaterally without stenosis or branch occlusion. Negative for cerebral vascular malformation. MRA NECK FINDINGS Carotid bifurcation widely patent bilaterally without significant stenosis. Mild atherosclerotic disease in the left carotid bulb. Both vertebral arteries patent to the basilar. Moderate stenosis at the origin of the vertebral artery bilaterally. Mild stenosis distal vertebral artery bilaterally. IMPRESSION: Restricted diffusion left pons unchanged from the recent MRI compatible with subacute infarct. No new area of infarct since the prior MRI of 01/14/2014. Moderate chronic microvascular ischemic change. 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 Basilar is patent. Electronically Signed   By: Marlan Palau M.D.   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  Records reviewed and summated above. Stroke: Continue secondary stroke prophylaxis and Risk Factor Modification listed below:   Antiplatelet therapy:   Blood Pressure Management:  Continue current medication with prn's with permisive HTN per primary team Statin Agent:   Diabetes management:   Tobacco abuse:   Right sided hemiparesis: fit for orthosis to prevent contractures (resting hand splint for day, wrist cock up splint at night, PRAFO, etc)  Motor recovery: Fluoxetine  1. Does the need for close, 24 hr/day medical supervision in concert with the patient's rehab needs make it unreasonable for this patient to be served in a less intensive setting? Yes 2. Co-Morbidities requiring supervision/potential complications: HTN (monitor and provide prns in accordance with increased physical exertion and pain), tobacco abuse (counsel), hyperlipidemia (cont meds), DM (Monitor in accordance with  exercise and adjust meds as necessary) 3. Due to safety, disease management and patient education, does the  patient require 24 hr/day rehab nursing? Yes 4. Does the patient require coordinated care of a physician, rehab nurse, PT (1-2 hrs/day, 5 days/week), OT (1-2 hrs/day, 5 days/week) and SLP (1-2 hrs/day, 5 days/week) to address physical and functional deficits in the context of the above medical diagnosis(es)? Yes Addressing deficits in the following areas: balance, endurance, locomotion, strength, transferring, bathing, dressing, toileting, speech, swallowing and psychosocial support 5. Can the patient actively participate in an intensive therapy program of at least 3 hrs of therapy per day at least 5 days per week? Yes 6. The potential for patient to make measurable gains while on inpatient rehab is excellent 7. Anticipated functional outcomes upon discharge from inpatient rehab are supervision and min assist  with PT, supervision and min assist with OT, independent and modified independent with SLP. 8. Estimated rehab length of stay to reach the above functional goals is: 15-19 days. 9. Does the patient have adequate social supports and living environment to accommodate these discharge functional goals? Potentially 10. Anticipated D/C setting: Home 11. Anticipated post D/C treatments: HH therapy and Home excercise program 12. Overall Rehab/Functional Prognosis: good  RECOMMENDATIONS: This patient's condition is appropriate for continued rehabilitative care in the following setting: CIR if caregiver availability at discharge. Patient has agreed to participate in recommended program. Yes Note that insurance prior authorization may be required for reimbursement for recommended care.  Comment: Rehab Admissions Coordinator to follow up.  Maryla Morrow, MD 01/18/2016    Revision History

## 2016-01-20 NOTE — Care Management Note (Signed)
Case Management Note  Patient Details  Name: [name] [name]. MRN: [id number] Date of Birth: Jan 30, 1966  Subjective/Objective:                    Action/Plan: Pt discharging to CIR today. Dr Grandville Silos consulting CM about PCP. CM met with patient and asked about a PCP. He asked that the MD be close to his home. CM was able to obtain him an appointment at [facility] [place]. CM provide the patient with the information and placed it on the AVS. MD with orders for CBG machine through DME. CM spoke with Diabetes Team and they stated he just needed a script for his machine and supplies. Scripts entered for these per MD.   Expected Discharge Date:                  Expected Discharge Plan:  [place]  In-House Referral:     Discharge planning Services     Post Acute Care Choice:    Choice offered to:     DME Arranged:    DME Agency:     HH Arranged:    [place] Agency:     Status of Service:  Completed, signed off  If discussed at [employer], dates discussed:    Additional Comments:  Pollie Friar, RN 01/20/2016, 4:30 PM

## 2016-01-20 NOTE — Progress Notes (Signed)
[facility] Admission Coordinator Signed Physical Medicine and Rehabilitation  PMR Pre-admission Date of Service: 01/20/2016 1:13 PM  Related encounter: ED to Hosp-Admission (Current) from 01/17/2016 in [facility]M NEURO MEDICAL       [] Hide copied text PMR Admission Coordinator Pre-Admission Assessment  Patient: [name] [name]. is an 50 y.o., male MRN: [id number] DOB: Jul 28, 1965 Height: 5\' 10"  (177.8 cm) Weight: 117.9 kg (260 lb)                                                                                                                                                                                                                                                                          Insurance Information HMO:     PPO:      PCP:      IPA:      80/20:      OTHER: X PRIMARYMonia [name]      Policy#: [id number]      Subscriber: Self CM Name: [name] [name]      Phone#: [phone]     Fax#: [phone] Pre-Cert#: [id number]      Employer: Full Time Benefits:  Phone #: [phone]     Name: [name] [name]. 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  History of Present Illness:[name] [name] a 50 y.o.right handed malewith history of hypertension, tobacco abuse, Chronic renal  insufficiency with baseline  creatinine 1.3-1.4and hyperlipidemia. Per chart review patient lives with spouse in independentprior to admission ambulating without assistive device. 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 Presented back 01/17/2016 with increasing right sided weakness as well as right facial droopand slurred speech. Patient did not receive TPA. MRI of the brain did not show any new areas of infarct suspect evolution of prior pontine infarct. MRA of head and angiogram of neck showed no significant carotid stenosis in the neck. 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 Neurology follow-up suspect evolution of left pontine infarct. Patient remains on aspirin therapy. SubcutaneousLovenox added for DVT prophylaxis.Newly diagnosed diabetes mellitus with hemoglobin A1c 8.5 obtained obtained on sliding scale insulin.Physical therapy evaluation completed 01/18/2016 and patient now requiring moderate assist for ambulation. Request made for physical medicine rehabilitation consult.Patient was admitted for a comprehensive rehabilitation program.   NIH Total: 6    Past Medical History      Past Medical History:  Diagnosis Date  . Allergy   . Hernia, umbilical   . Hyperlipidemia   . Hypertension     Family History  family history includes Heart attack in his father.  Prior Rehab/Hospitalizations:  Has the patient had major surgery during 100 days prior to admission? No  Current Medications   Current Facility-Administered Medications:  .  0.9 %  sodium chloride infusion, , Intravenous, Continuous, Layne Benton, NP, Stopped at 01/19/16 1925 .  acetaminophen (TYLENOL) tablet 650 mg, 650 mg, Oral, Q4H PRN, 650 mg at 01/19/16  1334 **OR** acetaminophen (TYLENOL) suppository 650 mg, 650 mg, Rectal, Q4H PRN, Marcos Eke, PA-C .  aspirin tablet 325 mg, 325 mg, Oral, Daily, Marcos Eke, PA-C, 325 mg at 01/20/16 0919 .  atorvastatin (LIPITOR) tablet 20 mg, 20 mg, Oral, q1800, Sung Amabile Wertman, PA-C, 20 mg at 01/19/16 1734 .  enoxaparin (LOVENOX) injection 40 mg, 40 mg, Subcutaneous, Q24H, Marcos Eke, PA-C, 40 mg at 01/19/16 1207 .  hydrALAZINE (APRESOLINE) injection 5-10 mg, 5-10 mg, Intravenous, Q8H PRN, Marcos Eke, PA-C .  hydrochlorothiazide (MICROZIDE) capsule 12.5 mg, 12.5 mg, Oral, Daily, Marcos Eke, PA-C, 12.5 mg at 01/20/16 0919 .  insulin aspart (novoLOG) injection 0-9 Units, 0-9 Units, Subcutaneous, TID WC, Marcos Eke, PA-C, 1 Units at 01/20/16 0703 .  lisinopril (PRINIVIL,ZESTRIL) tablet 20 mg, 20 mg, Oral, Daily, Marcos Eke, PA-C, 20 mg at 01/20/16 0919 .  LORazepam (ATIVAN) injection 1-2 mg, 1-2 mg, Intravenous, Once, Kathlen Mody, MD .  nicotine (NICODERM CQ - dosed in mg/24 hours) patch 21 mg, 21 mg, Transdermal, Daily, Marcos Eke, PA-C, 21 mg at 01/20/16 0920 .  senna-docusate (Senokot-S) tablet 1 tablet, 1 tablet, Oral, QHS PRN, Marcos Eke, PA-C .  traMADol Janean Sark) tablet 50-100 mg, 50-100 mg, Oral, Q6H PRN, Rodolph Bong, MD, 100 mg at 01/19/16 1734  Patients Current Diet: Diet Carb Modified Fluid consistency: Thin; Room service appropriate? Yes  Precautions / Restrictions Precautions Precautions: Fall Restrictions Weight Bearing Restrictions: No   Has the patient had 2 or more falls or a fall with injury in the past year?No  Prior Activity Level Community (5-7x/wk): Prior to admission patient was completely independent, driving, working full time, and enjoyed working in the yard  in his spare time.  Home Assistive Devices / Equipment Home Assistive Devices/Equipment: None Home Equipment: None  Prior Device Use: Indicate devices/aids used by the patient  prior to current illness, exacerbation or injury? None of the above  Prior Functional Level Prior Function Level of Independence: Independent Comments: travels for work, pt did have R hand residual deficits/difficulty writing as he is R handed from TIA over the weekend  Self Care: Did the patient need help bathing, dressing, using the toilet or eating?  Independent  Indoor Mobility: Did the patient need assistance with walking from room to room (with or without device)? Independent  Stairs: Did the patient need assistance with internal or external stairs (with or without device)? Independent  Functional Cognition: Did the patient need help planning regular tasks such as shopping or remembering to take medications? Independent  Current Functional Level Cognition Overall Cognitive Status: Impaired/Different from baseline Orientation Level: Oriented X4 Safety/Judgement: Decreased awareness of safety, Decreased awareness of deficits General Comments: reviewed fall risk after Berg completed, discussed with nursing who reports he has been calling for assist to walk to the bathroom    Extremity Assessment (includes Sensation/Coordination) Upper Extremity Assessment: RUE deficits/detail RUE Deficits / Details: Decr sensation compared to L UE demonstrated by hitting items at counter height. Pt with digit activation flexion / extension but unable to demonstrate full grasp ( brunstrom II) Pt with decr wrist extension Pt able to perform elbow flexion and extension with accessory muscles. Pt tearful during assessment adn education RUE Sensation: decreased light touch RUE Coordination: decreased fine motor, decreased gross motor  Lower Extremity Assessment: Defer to PT evaluation RLE Deficits / Details: foot drop noted during OT session   ADLs Overall ADL's : Needs assistance/impaired Grooming: Wash/dry hands, Min guard, Standing Toilet Transfer: Min guard, Ambulation, Regular Toilet TPublic house managerils (indicate cue type and reason): needed cues to safety and R LE positioning. pt with hyperextension at the knee.  Functional mobility during ADLs: Min guard General ADL Comments: pt grabbing to door frames and reaching for other environmental supports at this time. pt with poor recall of R foot drop. pt when cues states "they put something on it before" Pt when completing sit<.stand lateral leans Lt and powers up with L side of body. pt provided exercises for UB and LB. pt educated on knee extension, knee flexion, glut squeeze, wrist flexion/ extension, digit flexion/ extension, elbow flexion , extension at this time.    Mobility General bed mobility comments: sitting on EOB   Transfers Overall transfer level: Needs assistance Equipment used: None Transfers: Sit to/from Stand Sit to Stand: Min guard General transfer comment: heavy use of momentum and multiple tries to complete activity with brining R foot back with cues for improved weight bearing assist for balance when coming up to stand   Ambulation / Gait / Stairs / Wheelchair Mobility Ambulation/Gait Ambulation/Gait assistance: Min assist, Mod assist Ambulation Distance (Feet): 120 Feet Assistive device: 1 person hand held assist Gait Pattern/deviations: Step-through pattern, Decreased dorsiflexion - right, Decreased stride length, Narrow base of support General Gait Details: used ace wrap on R LE for DF assist, HHA on R for balance, assist with weight shift and safety Gait velocity interpretation: Below normal speed for age/gender   Posture / Balance Balance Overall balance assessment: Needs assistance Sitting-balance support: No upper extremity supported, Feet supported Sitting balance-Leahy Scale: Good Standing balance support: During functional activity Standing balance-Leahy Scale: Fair Standardized Balance Assessment Standardized Balance Assessment : Programmer, systems  Test Berg Balance Test Sit to Stand: Able to stand  using hands after several tries Standing Unsupported: Able to stand safely 2 minutes Sitting with Back Unsupported but Feet Supported on Floor or Stool: Able to sit safely and securely 2 minutes Stand to Sit: Controls descent by using hands Transfers: Able to transfer with verbal cueing and /or supervision Standing Unsupported with Eyes Closed: Able to stand 10 seconds with supervision Standing Ubsupported with Feet Together: Able to place feet together independently and stand for 1 minute with supervision From Standing, Reach Forward with Outstretched Arm: Can reach confidently >25 cm (10") From Standing Position, Pick up Object from Floor: Able to pick up shoe, needs supervision From Standing Position, Turn to Look Behind Over each Shoulder: Looks behind one side only/other side shows less weight shift Turn 360 Degrees: Needs assistance while turning Standing Unsupported, Alternately Place Feet on Step/Stool: Able to complete >2 steps/needs minimal assist Standing Unsupported, One Foot in Front: Able to plae foot ahead of the other independently and hold 30 seconds Standing on One Leg: Tries to lift leg/unable to hold 3 seconds but remains standing independently Total Score: 36   Special needs/care consideration BiPAP/CPAP: No CPM: No Continuous Drip IV: No Dialysis: No         Life Vest: No Oxygen: No Special Bed: No Trach Size: No Wound Vac (area): No       Skin: WDL                               Bowel mgmt: 01/19/16 Bladder mgmt: continent and requesting help as needed Diabetic mgmt: new diabetic upon this admission      Previous Home Environment Living Arrangements: Spouse/significant other  Lives With: Family Available Help at Discharge: Family, Friend(s), Available PRN/intermittently Type of Home: House Home Layout: Two level Alternate Level Stairs-Rails: Left (then changes to R) Alternate Level Stairs-Number of Steps: flight Home Access: Stairs to enter Entrance  Stairs-Rails: Right Entrance Stairs-Number of Steps: 3 Home Care Services: No Additional Comments: has x2 pug dogs in the home that are wife's.   Discharge Living Setting Plans for Discharge Living Setting: Patient's home Type of Home at Discharge: House Discharge Home Layout: Two level Alternate Level Stairs-Rails:  (first 10 left then next 10 right ) Alternate Level Stairs-Number of Steps: 10, landing, 10 Discharge Home Access: Stairs to enter Entrance Stairs-Rails: Right Entrance Stairs-Number of Steps: 4 Discharge Bathroom Shower/Tub: Walk-in shower Discharge Bathroom Toilet: Standard Discharge Bathroom Accessibility: Yes How Accessible: Accessible via walker Does the patient have any problems obtaining your medications?: No  Social/Family/Support Systems Patient Roles: Spouse, Parent Anticipated Caregiver: Spouse [name] [name] Anticipated Caregiver's Contact Information: cell: [phone] Ability/Limitations of Caregiver: Spouse works full time  Medical laboratory scientific officer: Intermittent Discharge Plan Discussed with Primary Caregiver: Yes Is Caregiver In Agreement with Plan?: Yes Does Caregiver/Family have Issues with Lodging/Transportation while Pt is in Rehab?: No  Goals/Additional Needs Patient/Family Goal for Rehab: PT/OT/SLP Supervision-Mod I  Expected length of stay: 15-19 days  Cultural Considerations: None Dietary Needs: Carb Mod, Heart Healthy Equipment Needs: TBD Special Service Needs: None Additional Information: Diet restrictions  Pt/Family Agrees to Admission and willing to participate: Yes Program Orientation Provided & Reviewed with Pt/Caregiver Including Roles  & Responsibilities: Yes Additional Information Needs: Diet restrictions  Information Needs to be Provided By: Dietician   Decrease burden of Care through IP rehab admission: No  Possible need for SNF placement upon  discharge: No  Patient Condition: This patient's medical and functional status  has changed since the consult dated 01/18/16 in which the Rehabilitation Physician determined and documented that the patient was potentially appropriate for intensive rehabilitative care in an inpatient rehabilitation facility. Issues have been addressed and update has been discussed with Dr. Wynn BankerKirsteins  and patient now appropriate for inpatient rehabilitation. Will admit to inpatient rehab today.   Preadmission Screen Completed By:  Fae PippinMelissa Norabelle Kondo, 01/20/2016 1:13 PM ______________________________________________________________________   Discussed status with Dr. Wynn BankerKirsteins on 01/20/16 at 1325 and received telephone approval for admission today.  Admission Coordinator:  Fae PippinMelissa Omaira Mellen, time 1325/Date 01/20/16       Cosigned by: Erick ColaceAndrew E Kirsteins, MD at 01/20/2016 1:39 PM  Revision History

## 2016-01-20 NOTE — Progress Notes (Addendum)
Inpatient Rehabilitation  Continuing to follow for IP Rehab admission.  I have a bed to offer today.  I await medical clearance and insurance decision, likely will hear this afternoon.  Plan to update the team as I know.  Please call with questions.   Addendum: I have received insurance authorization from [employer] for IP Rehab admission today.  I now await MD clearance.    Charlane FerrettiMelissa Miles Borkowski, M.A., CCC/SLP Admission Coordinator  [facility]  Cell [phone]

## 2016-01-20 NOTE — Progress Notes (Signed)
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 is a 50 y.o. right handed male with history of hypertension, tobacco abuse and hyperlipidemia. Per chart review patient lives with spouse in independent prior to admission ambulating without assistive device. 2 level home with 3 steps to entry and bedroom upstairs. Wife works during the day. Patient recently admitted to the hospital with a small left pontine infarct 01/15/2016 discharged to home 01/16/2016 at supervision level maintained on aspirin. Presented back 01/17/2016 with increasing right sided weakness as well as right facial droop and slurred speech. Patient did not receive TPA. MRI of the brain did not show any new areas of infarct suspect evolution of prior pontine infarct. MRA of head and angiogram of neck showed no significant carotid stenosis in the neck. 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 Neurology follow-up suspect evolution of left pontine infarct. Patient remains on aspirin therapy. Subtenon's Lovenox added for DVT prophylaxis. Physical therapy evaluation completed 01/18/2016 and patient now requiring moderate assist for ambulation. Request made for physical medicine rehabilitation consult.   Review of Systems  Constitutional: Negative for chills and fever.  HENT: Negative for hearing loss.   Eyes: Negative for blurred vision and double vision.  Respiratory: Negative for  cough and shortness of breath.   Cardiovascular: Negative for chest pain, palpitations and leg swelling.  Gastrointestinal: Positive for constipation. Negative for nausea and vomiting.  Genitourinary: Negative for dysuria and hematuria.  Musculoskeletal: Positive for myalgias.  Skin: Negative for rash.  Neurological: Positive for speech change, focal weakness and weakness. Negative for sensory change, seizures, loss of consciousness and headaches.  All other systems reviewed and are negative.      Past Medical History:  Diagnosis Date  . Allergy   . Hernia, umbilical   . Hyperlipidemia   . Hypertension         Past Surgical History:  Procedure Laterality Date  . UVULOPALATOPHARYNGOPLASTY    . VARICOSE VEIN SURGERY          Family History  Problem Relation Age of Onset  . Heart attack Father    Social History:  reports that he has been smoking.  He has been smoking about 2.00 packs per day. He has never used smokeless tobacco. He reports that he drinks alcohol. He reports that he does not use drugs. Allergies:      Allergies  Allergen Reactions  . Morphine And Related Itching         Medications Prior to Admission  Medication Sig Dispense Refill  . aspirin 325 MG tablet Take 1 tablet (325 mg total) by mouth daily. 30 tablet 0  . atorvastatin (LIPITOR) 20 MG tablet Take 1 tablet (20 mg total) by mouth daily at 6 PM. 30 tablet 0  . lisinopril-hydrochlorothiazide (ZESTORETIC) 20-12.5 MG per tablet Take 1 tablet by mouth daily. 90 tablet 3  . fluticasone (FLONASE) 50 MCG/ACT nasal spray Place 1-2 sprays into both nostrils daily as needed for rhinitis.  Home: Home Living Family/patient expects to be discharged to:: Private residence Living Arrangements: Spouse/significant other Available Help at Discharge: Family, Friend(s), Available PRN/intermittently Type of Home: House Home Access: Stairs to enter Secretary/administratorntrance Stairs-Number of Steps: 3 Entrance  Stairs-Rails: Right Home Layout: Two level Alternate Level Stairs-Number of Steps: flight Alternate Level Stairs-Rails: Left (then changes to R) Home Equipment: None  Lives With: Family  Functional History: Prior Function Level of Independence: Independent Comments: travels for work, pt did have R hand residual deficits/difficulty writing as he is R handed from TIA over the weekend Functional Status:  Mobility: Bed Mobility General bed mobility comments: pt found sitting EOB Transfers Overall transfer level: Needs assistance Equipment used: None Transfers: Sit to/from Stand Sit to Stand: Min assist General transfer comment: unable to use R UE functionally, used momentum to bring self up into standing, impulsively quick, leaning more to the L due to R sided weakness Ambulation/Gait Ambulation/Gait assistance: Mod assist Ambulation Distance (Feet): 75 Feet (x2, 1 trial without ace wrap and 1 trial with wrap) Assistive device: 1 person hand held assist Gait Pattern/deviations: Step-through pattern, Decreased dorsiflexion - right, Decreased stride length, Narrow base of support General Gait Details: Pt required mod hand held A to maintain balance due to increased weight shift to L for clearance of R foot. Ace wrap to assist with R DF. Verbal ques to focus on R foot placement and to decrease excessive extension of R knee.    Gait velocity interpretation: Below normal speed for age/gender    ADL:    Cognition: Cognition Overall Cognitive Status: Impaired/Different from baseline Orientation Level: Oriented X4 Cognition Arousal/Alertness: Awake/alert Behavior During Therapy: Impulsive Overall Cognitive Status: Impaired/Different from baseline Area of Impairment: Safety/judgement Safety/Judgement: Decreased awareness of safety, Decreased awareness of deficits General Comments: Pt is aware of R sided weakness. However pt is impulsive with movement and decreased insight to safety  regarding new physical deficits.   Blood pressure (!) 129/109, pulse 75, temperature 98.8 F (37.1 C), temperature source Oral, resp. rate 18, height 5\' 10"  (1.778 m), weight 117.9 kg (260 lb), SpO2 97 %. Physical Exam  Constitutional: He is oriented to person, place, and time. He appears well-developed and well-nourished.  HENT:  Head: Normocephalic and atraumatic.  Eyes: Conjunctivae and EOM are normal.  Neck: Normal range of motion. Neck supple. No thyromegaly present.  Cardiovascular: Normal rate and regular rhythm.   Respiratory: Effort normal and breath sounds normal. No respiratory distress.  GI: Soft. Bowel sounds are normal. He exhibits no distension.  Musculoskeletal: He exhibits no edema or tenderness.  Neurological: He is alert and oriented to person, place, and time.  Dysarthria speech but intelligible.  Good awareness of deficits Right hemifacial wekaness Sensation intact to light touch DTRs symmetric Motor: RUE: 2/5 RLE: Hip flexion 2+/5, knee extension 4/5, ankle dorsi/plantar flexion 0/5 LUE/LLE: 5/5 proximal to distal  Skin: Skin is warm and dry.  Psychiatric: He has a normal mood and affect. His behavior is normal. Thought content normal.    Lab Results Last 24 Hours       Results for orders placed or performed during the hospital encounter of 01/17/16 (from the past 24 hour(s))  Glucose, capillary     Status: Abnormal   Collection Time: 01/17/16  5:31 PM  Result Value Ref Range   Glucose-Capillary 215 (H) 65 - 99 mg/dL   Comment 1 Notify RN    Comment 2 Document in Chart   Glucose, capillary     Status:  Abnormal   Collection Time: 01/17/16 10:47 PM  Result Value Ref Range   Glucose-Capillary 103 (H) 65 - 99 mg/dL   Comment 1 Notify RN    Comment 2 Document in Chart   Glucose, capillary     Status: Abnormal   Collection Time: 01/18/16  6:31 AM  Result Value Ref Range   Glucose-Capillary 167 (H) 65 - 99 mg/dL   Comment 1 Notify RN      Comment 2 Document in Chart   Glucose, capillary     Status: Abnormal   Collection Time: 01/18/16 11:15 AM  Result Value Ref Range   Glucose-Capillary 149 (H) 65 - 99 mg/dL      Imaging Results (Last 48 hours)  Dg Chest 2 View  Result Date: 01/17/2016 CLINICAL DATA:  Patient admitted with a stroke on 01/14/2016. Inability to move the right arm and severe right leg weakness today. Slurred speech. EXAM: CHEST  2 VIEW COMPARISON:  None. FINDINGS: The lungs are clear. Heart size is upper normal. No pneumothorax or pleural effusion. No focal bony abnormality. IMPRESSION: No acute disease. Electronically Signed   By: Drusilla Kanner M.D.   On: 01/17/2016 16:35   Ct Head Wo Contrast  Result Date: 01/17/2016 CLINICAL DATA:  50 year old male with worsening right arm weakness and right facial droop since yesterday. Recent stroke. Subsequent encounter. EXAM: CT HEAD WITHOUT CONTRAST TECHNIQUE: Contiguous axial images were obtained from the base of the skull through the vertex without intravenous contrast. COMPARISON:  01/15/2016 MR. 01/14/2016 CT angiogram head and neck and CT. FINDINGS: Brain: Acute left lower pontine infarct may have progressed since prior exam and is more conspicuous on the present CT. Chronic microvascular CT moderate chronic microvascular changes. No intracranial hemorrhage. Prominent basal ganglia calcifications/mineralization unchanged. No hydrocephalus. No intracranial mass lesion noted on this unenhanced exam. Vascular: No hyperdense major vessel. Skull: No acute abnormality. Sinuses/Orbits: Moderate opacification right maxillary sinus. Minimal mucosal thickening left maxillary sinus. Partial opacification ethmoid sinus air cells bilaterally. Other: Negative. IMPRESSION: Acute left lower pontine infarct may have progressed since prior exam and is more conspicuous on the present CT. Moderate microvascular changes. No intracranial hemorrhage. Moderate opacification right  maxillary sinus. Minimal mucosal thickening left maxillary sinus. Partial opacification ethmoid sinus air cells bilaterally. Electronically Signed   By: Lacy Duverney M.D.   On: 01/17/2016 10:36   Mr Angiogram Neck W Wo Contrast  Result Date: 01/18/2016 CLINICAL DATA:  Recent left pontine stroke with worsening symptoms of right-sided weakness. EXAM: MR HEAD WITHOUT CONTRAST MR CIRCLE OF WILLIS WITHOUT CONTRAST MRA OF THE NECK WITHOUT AND WITH CONTRAST TECHNIQUE: Multiplanar, multiecho pulse sequences of the brain and surrounding structures were obtained according to standard protocol without intravenous contrast.; Multiplanar and multiecho pulse sequences of the neck were obtained without and with intravenous contrast. Angiographic images of the neck were obtained using MRA technique without and with intravenous contast.; Angiographic images of the Circle of Willis were obtained using MRA technique without intravenous contrast. CONTRAST:  20mL MULTIHANCE GADOBENATE DIMEGLUMINE 529 MG/ML IV SOLN COMPARISON:  MRI 01/15/2016.  CTA 01/14/2016 FINDINGS: MR HEAD FINDINGS Restricted diffusion in the left lower ventral pons is unchanged in size from the recent MRI. No new area of acute infarct since the prior study. Generalized atrophy. Moderate chronic microvascular ischemic change throughout the white matter. Chronic lacunar infarction left middle cerebellar peduncle unchanged. Negative for hemorrhage or mass.  No shift of the midline structures Mucosal edema throughout the paranasal sinuses right greater than  left unchanged. Pituitary not enlarged. Normal orbital structures. MR CIRCLE OF WILLIS FINDINGS Image quality degraded by mild motion. There is misregistration in the mid basilar and internal carotid artery due to patient motion Both vertebral arteries patent to the basilar with mild stenosis distally in both vertebral arteries. Basilar widely patent. Superior cerebellar and posterior cerebral arteries patent.  Fetal origin left posterior cerebral artery. Left PICA patent. Right PICA not visualized. Internal carotid artery patent bilaterally without stenosis. Anterior and middle cerebral arteries patent bilaterally without stenosis or branch occlusion. Negative for cerebral vascular malformation. MRA NECK FINDINGS Carotid bifurcation widely patent bilaterally without significant stenosis. Mild atherosclerotic disease in the left carotid bulb. Both vertebral arteries patent to the basilar. Moderate stenosis at the origin of the vertebral artery bilaterally. Mild stenosis distal vertebral artery bilaterally. IMPRESSION: Restricted diffusion left pons unchanged from the recent MRI compatible with subacute infarct. No new area of infarct since the prior MRI of 01/14/2014. Moderate chronic microvascular ischemic change. 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 Basilar is patent. Electronically Signed   By: Marlan Palau M.D.   On: 01/18/2016 11:15   Mr Brain Wo Contrast  Result Date: 01/18/2016 CLINICAL DATA:  Recent left pontine stroke with worsening symptoms of right-sided weakness. EXAM: MR HEAD WITHOUT CONTRAST MR CIRCLE OF WILLIS WITHOUT CONTRAST MRA OF THE NECK WITHOUT AND WITH CONTRAST TECHNIQUE: Multiplanar, multiecho pulse sequences of the brain and surrounding structures were obtained according to standard protocol without intravenous contrast.; Multiplanar and multiecho pulse sequences of the neck were obtained without and with intravenous contrast. Angiographic images of the neck were obtained using MRA technique without and with intravenous contast.; Angiographic images of the Circle of Willis were obtained using MRA technique without intravenous contrast. CONTRAST:  20mL MULTIHANCE GADOBENATE DIMEGLUMINE 529 MG/ML IV SOLN COMPARISON:  MRI 01/15/2016.  CTA 01/14/2016 FINDINGS: MR HEAD  FINDINGS Restricted diffusion in the left lower ventral pons is unchanged in size from the recent MRI. No new area of acute infarct since the prior study. Generalized atrophy. Moderate chronic microvascular ischemic change throughout the white matter. Chronic lacunar infarction left middle cerebellar peduncle unchanged. Negative for hemorrhage or mass.  No shift of the midline structures Mucosal edema throughout the paranasal sinuses right greater than left unchanged. Pituitary not enlarged. Normal orbital structures. MR CIRCLE OF WILLIS FINDINGS Image quality degraded by mild motion. There is misregistration in the mid basilar and internal carotid artery due to patient motion Both vertebral arteries patent to the basilar with mild stenosis distally in both vertebral arteries. Basilar widely patent. Superior cerebellar and posterior cerebral arteries patent. Fetal origin left posterior cerebral artery. Left PICA patent. Right PICA not visualized. Internal carotid artery patent bilaterally without stenosis. Anterior and middle cerebral arteries patent bilaterally without stenosis or branch occlusion. Negative for cerebral vascular malformation. MRA NECK FINDINGS Carotid bifurcation widely patent bilaterally without significant stenosis. Mild atherosclerotic disease in the left carotid bulb. Both vertebral arteries patent to the basilar. Moderate stenosis at the origin of the vertebral artery bilaterally. Mild stenosis distal vertebral artery bilaterally. IMPRESSION: Restricted diffusion left pons unchanged from the recent MRI compatible with subacute infarct. No new area of infarct since the prior MRI of 01/14/2014. Moderate chronic microvascular ischemic change. 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 Basilar  is patent. Electronically Signed   By: Marlan Palau M.D.   On: 01/18/2016 11:15    Mr Maxine Glenn Head/brain WU Cm  Result Date: 01/18/2016 CLINICAL DATA:  Recent left pontine stroke with worsening symptoms of right-sided weakness. EXAM: MR HEAD WITHOUT CONTRAST MR CIRCLE OF WILLIS WITHOUT CONTRAST MRA OF THE NECK WITHOUT AND WITH CONTRAST TECHNIQUE: Multiplanar, multiecho pulse sequences of the brain and surrounding structures were obtained according to standard protocol without intravenous contrast.; Multiplanar and multiecho pulse sequences of the neck were obtained without and with intravenous contrast. Angiographic images of the neck were obtained using MRA technique without and with intravenous contast.; Angiographic images of the Circle of Willis were obtained using MRA technique without intravenous contrast. CONTRAST:  20mL MULTIHANCE GADOBENATE DIMEGLUMINE 529 MG/ML IV SOLN COMPARISON:  MRI 01/15/2016.  CTA 01/14/2016 FINDINGS: MR HEAD FINDINGS Restricted diffusion in the left lower ventral pons is unchanged in size from the recent MRI. No new area of acute infarct since the prior study. Generalized atrophy. Moderate chronic microvascular ischemic change throughout the white matter. Chronic lacunar infarction left middle cerebellar peduncle unchanged. Negative for hemorrhage or mass.  No shift of the midline structures Mucosal edema throughout the paranasal sinuses right greater than left unchanged. Pituitary not enlarged. Normal orbital structures. MR CIRCLE OF WILLIS FINDINGS Image quality degraded by mild motion. There is misregistration in the mid basilar and internal carotid artery due to patient motion Both vertebral arteries patent to the basilar with mild stenosis distally in both vertebral arteries. Basilar widely patent. Superior cerebellar and posterior cerebral arteries patent. Fetal origin left posterior cerebral artery. Left PICA patent. Right PICA not visualized. Internal carotid artery patent bilaterally without stenosis. Anterior and middle cerebral arteries patent  bilaterally without stenosis or branch occlusion. Negative for cerebral vascular malformation. MRA NECK FINDINGS Carotid bifurcation widely patent bilaterally without significant stenosis. Mild atherosclerotic disease in the left carotid bulb. Both vertebral arteries patent to the basilar. Moderate stenosis at the origin of the vertebral artery bilaterally. Mild stenosis distal vertebral artery bilaterally. IMPRESSION: Restricted diffusion left pons unchanged from the recent MRI compatible with subacute infarct. No new area of infarct since the prior MRI of 01/14/2014. Moderate chronic microvascular ischemic change. 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 Basilar is patent. Electronically Signed   By: Marlan Palau M.D.   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  Records reviewed and summated above. Stroke: Continue secondary stroke prophylaxis and Risk Factor Modification listed below:   Antiplatelet therapy:   Blood Pressure Management:  Continue current medication with prn's with permisive HTN per primary team Statin Agent:   Diabetes management:   Tobacco abuse:   Right sided hemiparesis: fit for orthosis to prevent contractures (resting hand splint for day, wrist cock up splint at night, PRAFO, etc)  Motor recovery: Fluoxetine  1. Does the need for close, 24 hr/day medical supervision in concert with the patient's rehab needs make it unreasonable for this patient to be served in a less intensive setting? Yes 2. Co-Morbidities requiring supervision/potential complications: HTN (monitor and provide prns in accordance with increased physical exertion and pain), tobacco abuse (counsel), hyperlipidemia (cont meds), DM (Monitor in accordance with  exercise and adjust meds as necessary) 3. Due to safety, disease management and patient education, does the  patient require 24 hr/day rehab nursing? Yes 4. Does the patient require coordinated care of a physician, rehab nurse, PT (1-2 hrs/day, 5 days/week), OT (1-2 hrs/day, 5 days/week) and SLP (1-2 hrs/day, 5 days/week) to address physical and functional deficits in the context of the above medical diagnosis(es)? Yes Addressing deficits in the following areas: balance, endurance, locomotion, strength, transferring, bathing, dressing, toileting, speech, swallowing and psychosocial support 5. Can the patient actively participate in an intensive therapy program of at least 3 hrs of therapy per day at least 5 days per week? Yes 6. The potential for patient to make measurable gains while on inpatient rehab is excellent 7. Anticipated functional outcomes upon discharge from inpatient rehab are supervision and min assist  with PT, supervision and min assist with OT, independent and modified independent with SLP. 8. Estimated rehab length of stay to reach the above functional goals is: 15-19 days. 9. Does the patient have adequate social supports and living environment to accommodate these discharge functional goals? Potentially 10. Anticipated D/C setting: Home 11. Anticipated post D/C treatments: HH therapy and Home excercise program 12. Overall Rehab/Functional Prognosis: good  RECOMMENDATIONS: This patient's condition is appropriate for continued rehabilitative care in the following setting: CIR if caregiver availability at discharge. Patient has agreed to participate in recommended program. Yes Note that insurance prior authorization may be required for reimbursement for recommended care.  Comment: Rehab Admissions Coordinator to follow up.  Maryla Morrow, MD 01/18/2016    Revision History

## 2016-01-20 NOTE — Progress Notes (Signed)
  RD consulted for nutrition education regarding diabetes.   Lab Results  Component Value Date   HGBA1C 8.5 (H) 01/15/2016    RD provided "Carbohydrate Counting for People with Diabetes" handout from the Academy of [employer] and Dietetics. Discussed different food groups and their effects on blood sugar, emphasizing carbohydrate-containing foods. Provided list of carbohydrates and recommended serving sizes of common foods.  Discussed importance of controlled and consistent carbohydrate intake throughout the day. Provided examples of ways to balance meals/snacks and encouraged intake of high-fiber, whole grain complex carbohydrates. Discussed the benefits of physical activity for blood glucose control, weight management, and heart health. Teach back method used. Pt reports plans to stop drinking beer (was drinking 6-8 beers/day PTA) and to decrease portion sizes of foods, especially rice and beans.   Expect good compliance. Wife present for education and reports plans to help/support her husband.   Body mass index is 37.31 kg/m. Pt meets criteria for Obesity based on current BMI.  Current diet order is Carb Modified, patient is consuming approximately 100% of meals at this time. Labs and medications reviewed. No further nutrition interventions warranted at this time. RD contact information provided. If additional nutrition issues arise, please re-consult RD.  Dorothea Ogleeanne Naasir Carreira RD, LDN Inpatient Clinical Dietitian Pager: [phone] After Hours Pager: [phone]

## 2016-01-20 NOTE — Progress Notes (Signed)
Pt refusing bed alarm to be on.  States he will call for staff assistance when OOB.  Educated on risk for falls and possible injury. Pt verbalizes understanding. Pt is pleasant and cooperative, just moves a lot in bed and sets alarm off constantly.  Pt states it is his choice and understands consequences. [name] [name]

## 2016-01-20 NOTE — PMR Pre-admission (Signed)
PMR Admission Coordinator Pre-Admission Assessment  Patient: [name] RalphHerman Flurry Jr. is an 50 y.o., male MRN: [id number] DOB: 08-16-1965 Height: 5\' 10"  (177.8 cm) Weight: 117.9 kg (260 lb)              Insurance Information HMO:     PPO:      PCP:      IPA:      80/20:      OTHER: X PRIMARYMonia Pouch: AETNA      Policy#: [id number]      Subscriber: Self CM Name: Butch Pennyeresa Bonnevie      Phone#: [phone]     Fax#: [phone] Pre-Cert#: [id number]      Employer: Full Time Benefits:  Phone #: [phone]     Name: Suzie PortelaJay Eff. Date: 12/17/15     Deduct: $2,500      Out of Pocket Max: $5,000      Life Max: unlimited CIR: 80% after deductible       SNF: 80% after deductible  Outpatient: PT/OT/SLP     Co-Pay: $60, 60 visits  Home Health: PT/OT/SLP      Co-Pay: 80% after deductible, 120 visits  DME: 50%      Co-Pay: 50% Providers: in network  Medicaid Application Date:       Case Manager:  Disability Application Date:       Case Worker:   Emergency Conservator, museum/galleryContact Information Contact Information    Name Relation Home Work Mobile   TremontGunn,Lisa Spouse [phone]  [phone]     Current Medical History  Patient Admitting Diagnosis: 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  History of Present Illness:[name] [name] a 50 y.o.right handed malewith history of hypertension, tobacco abuse, Chronic renal insufficiency with baseline creatinine 1.3-1.4 and hyperlipidemia. Per chart review patient lives with spouse in independentprior to admission ambulating without assistive device. 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 Presented back 01/17/2016 with increasing right sided weakness as well as right facial droopand slurred speech. Patient did not receive TPA. MRI of the  brain did not show any new areas of infarct suspect evolution of prior pontine infarct. MRA of head and angiogram of neck showed no significant carotid stenosis in the neck. 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 Neurology follow-up suspect evolution of left pontine infarct. Patient remains on aspirin therapy. Subcutaneous Lovenox added for DVT prophylaxis. Newly diagnosed diabetes mellitus with hemoglobin A1c 8.5 obtained obtained on sliding scale insulin. Physical therapy evaluation completed 01/18/2016 and patient now requiring moderate assist for ambulation. Request made for physical medicine rehabilitation consult.Patient was admitted for a comprehensive rehabilitation program.   NIH Total: 6    Past Medical History  Past Medical History:  Diagnosis Date  . Allergy   . Hernia, umbilical   . Hyperlipidemia   . Hypertension     Family History  family history includes Heart attack in his father.  Prior Rehab/Hospitalizations:  Has the patient had major surgery during 100 days prior to admission? No  Current Medications   Current Facility-Administered Medications:  .  0.9 %  sodium chloride infusion, , Intravenous, Continuous, Layne BentonSharon L Biby, NP, Stopped at 01/19/16 1925 .  acetaminophen (TYLENOL) tablet 650 mg, 650 mg, Oral, Q4H PRN, 650 mg at 01/19/16 1334 **OR** acetaminophen (  TYLENOL) suppository 650 mg, 650 mg, Rectal, Q4H PRN, Marcos Eke, PA-C .  aspirin tablet 325 mg, 325 mg, Oral, Daily, Marcos Eke, PA-C, 325 mg at 01/20/16 0919 .  atorvastatin (LIPITOR) tablet 20 mg, 20 mg, Oral, q1800, Sung Amabile Wertman, PA-C, 20 mg at 01/19/16 1734 .  enoxaparin (LOVENOX) injection 40 mg, 40 mg, Subcutaneous, Q24H, Marcos Eke, PA-C, 40 mg at 01/19/16 1207 .  hydrALAZINE (APRESOLINE) injection 5-10 mg, 5-10 mg, Intravenous, Q8H PRN, Marcos Eke, PA-C .  hydrochlorothiazide (MICROZIDE) capsule 12.5  mg, 12.5 mg, Oral, Daily, Marcos Eke, PA-C, 12.5 mg at 01/20/16 0919 .  insulin aspart (novoLOG) injection 0-9 Units, 0-9 Units, Subcutaneous, TID WC, Marcos Eke, PA-C, 1 Units at 01/20/16 0703 .  lisinopril (PRINIVIL,ZESTRIL) tablet 20 mg, 20 mg, Oral, Daily, Marcos Eke, PA-C, 20 mg at 01/20/16 0919 .  LORazepam (ATIVAN) injection 1-2 mg, 1-2 mg, Intravenous, Once, Kathlen Mody, MD .  nicotine (NICODERM CQ - dosed in mg/24 hours) patch 21 mg, 21 mg, Transdermal, Daily, Marcos Eke, PA-C, 21 mg at 01/20/16 0920 .  senna-docusate (Senokot-S) tablet 1 tablet, 1 tablet, Oral, QHS PRN, Marcos Eke, PA-C .  traMADol Janean Sark) tablet 50-100 mg, 50-100 mg, Oral, Q6H PRN, Rodolph Bong, MD, 100 mg at 01/19/16 1734  Patients Current Diet: Diet Carb Modified Fluid consistency: Thin; Room service appropriate? Yes  Precautions / Restrictions Precautions Precautions: Fall Restrictions Weight Bearing Restrictions: No   Has the patient had 2 or more falls or a fall with injury in the past year?No  Prior Activity Level Community (5-7x/wk): Prior to admission patient was completely independent, driving, working full time, and enjoyed working in the yard in his spare time.  Home Assistive Devices / Equipment Home Assistive Devices/Equipment: None Home Equipment: None  Prior Device Use: Indicate devices/aids used by the patient prior to current illness, exacerbation or injury? None of the above  Prior Functional Level Prior Function Level of Independence: Independent Comments: travels for work, pt did have R hand residual deficits/difficulty writing as he is R handed from TIA over the weekend  Self Care: Did the patient need help bathing, dressing, using the toilet or eating?  Independent  Indoor Mobility: Did the patient need assistance with walking from room to room (with or without device)? Independent  Stairs: Did the patient need assistance with internal or external stairs  (with or without device)? Independent  Functional Cognition: Did the patient need help planning regular tasks such as shopping or remembering to take medications? Independent  Current Functional Level Cognition  Overall Cognitive Status: Impaired/Different from baseline Orientation Level: Oriented X4 Safety/Judgement: Decreased awareness of safety, Decreased awareness of deficits General Comments: reviewed fall risk after Berg completed, discussed with nursing who reports he has been calling for assist to walk to the bathroom    Extremity Assessment (includes Sensation/Coordination)  Upper Extremity Assessment: RUE deficits/detail RUE Deficits / Details: Decr sensation compared to L UE demonstrated by hitting items at counter height. Pt with digit activation flexion / extension but unable to demonstrate full grasp ( brunstrom II) Pt with decr wrist extension Pt able to perform elbow flexion and extension with accessory muscles. Pt tearful during assessment adn education RUE Sensation: decreased light touch RUE Coordination: decreased fine motor, decreased gross motor  Lower Extremity Assessment: Defer to PT evaluation RLE Deficits / Details: foot drop noted during OT session    ADLs  Overall ADL's : Needs assistance/impaired Grooming: Wash/dry  hands, Min guard, Standing Toilet Transfer: Min guard, Ambulation, Regular Toilet Toilet Transfer Details (indicate cue type and reason): needed cues to safety and R LE positioning. pt with hyperextension at the knee.  Functional mobility during ADLs: Min guard General ADL Comments: pt grabbing to door frames and reaching for other environmental supports at this time. pt with poor recall of R foot drop. pt when cues states "they put something on it before" Pt when completing sit<.stand lateral leans Lt and powers up with L side of body. pt provided exercises for UB and LB. pt educated on knee extension, knee flexion, glut squeeze, wrist flexion/  extension, digit flexion/ extension, elbow flexion , extension at this time.     Mobility  General bed mobility comments: sitting on EOB    Transfers  Overall transfer level: Needs assistance Equipment used: None Transfers: Sit to/from Stand Sit to Stand: Min guard General transfer comment: heavy use of momentum and multiple tries to complete activity with brining R foot back with cues for improved weight bearing assist for balance when coming up to stand    Ambulation / Gait / Stairs / Wheelchair Mobility  Ambulation/Gait Ambulation/Gait assistance: Min assist, Mod assist Ambulation Distance (Feet): 120 Feet Assistive device: 1 person hand held assist Gait Pattern/deviations: Step-through pattern, Decreased dorsiflexion - right, Decreased stride length, Narrow base of support General Gait Details: used ace wrap on R LE for DF assist, HHA on R for balance, assist with weight shift and safety Gait velocity interpretation: Below normal speed for age/gender    Posture / Balance Balance Overall balance assessment: Needs assistance Sitting-balance support: No upper extremity supported, Feet supported Sitting balance-Leahy Scale: Good Standing balance support: During functional activity Standing balance-Leahy Scale: Fair Standardized Balance Assessment Standardized Balance Assessment : Berg Balance Test Berg Balance Test Sit to Stand: Able to stand using hands after several tries Standing Unsupported: Able to stand safely 2 minutes Sitting with Back Unsupported but Feet Supported on Floor or Stool: Able to sit safely and securely 2 minutes Stand to Sit: Controls descent by using hands Transfers: Able to transfer with verbal cueing and /or supervision Standing Unsupported with Eyes Closed: Able to stand 10 seconds with supervision Standing Ubsupported with Feet Together: Able to place feet together independently and stand for 1 minute with supervision From Standing, Reach Forward with  Outstretched Arm: Can reach confidently >25 cm (10") From Standing Position, Pick up Object from Floor: Able to pick up shoe, needs supervision From Standing Position, Turn to Look Behind Over each Shoulder: Looks behind one side only/other side shows less weight shift Turn 360 Degrees: Needs assistance while turning Standing Unsupported, Alternately Place Feet on Step/Stool: Able to complete >2 steps/needs minimal assist Standing Unsupported, One Foot in Front: Able to plae foot ahead of the other independently and hold 30 seconds Standing on One Leg: Tries to lift leg/unable to hold 3 seconds but remains standing independently Total Score: 36    Special needs/care consideration BiPAP/CPAP: No CPM: No Continuous Drip IV: No Dialysis: No         Life Vest: No Oxygen: No Special Bed: No Trach Size: No Wound Vac (area): No       Skin: WDL                               Bowel mgmt: 01/19/16 Bladder mgmt: continent and requesting help as needed Diabetic mgmt: new diabetic upon this admission  Previous Home Environment Living Arrangements: Spouse/significant other  Lives With: Family Available Help at Discharge: Family, Friend(s), Available PRN/intermittently Type of Home: House Home Layout: Two level Alternate Level Stairs-Rails: Left (then changes to R) Alternate Level Stairs-Number of Steps: flight Home Access: Stairs to enter Entrance Stairs-Rails: Right Entrance Stairs-Number of Steps: 3 Home Care Services: No Additional Comments: has x2 pug dogs in the home that are wife's.   Discharge Living Setting Plans for Discharge Living Setting: Patient's home Type of Home at Discharge: House Discharge Home Layout: Two level Alternate Level Stairs-Rails:  (first 10 left then next 10 right ) Alternate Level Stairs-Number of Steps: 10, landing, 10 Discharge Home Access: Stairs to enter Entrance Stairs-Rails: Right Entrance Stairs-Number of Steps: 4 Discharge Bathroom  Shower/Tub: Walk-in shower Discharge Bathroom Toilet: Standard Discharge Bathroom Accessibility: Yes How Accessible: Accessible via walker Does the patient have any problems obtaining your medications?: No  Social/Family/Support Systems Patient Roles: Spouse, Parent Anticipated Caregiver: Spouse Debara Pickett Anticipated Caregiver's Contact Information: cell: [phone] Ability/Limitations of Caregiver: Spouse works full time  Medical laboratory scientific officer: Intermittent Discharge Plan Discussed with Primary Caregiver: Yes Is Caregiver In Agreement with Plan?: Yes Does Caregiver/Family have Issues with Lodging/Transportation while Pt is in Rehab?: No  Goals/Additional Needs Patient/Family Goal for Rehab: PT/OT/SLP Supervision-Mod I  Expected length of stay: 15-19 days  Cultural Considerations: None Dietary Needs: Carb Mod, Heart Healthy Equipment Needs: TBD Special Service Needs: None Additional Information: Diet restrictions  Pt/Family Agrees to Admission and willing to participate: Yes Program Orientation Provided & Reviewed with Pt/Caregiver Including Roles  & Responsibilities: Yes Additional Information Needs: Diet restrictions  Information Needs to be Provided By: Dietician   Decrease burden of Care through IP rehab admission: No  Possible need for SNF placement upon discharge: No  Patient Condition: This patient's medical and functional status has changed since the consult dated 01/18/16 in which the Rehabilitation Physician determined and documented that the patient was potentially appropriate for intensive rehabilitative care in an inpatient rehabilitation facility. Issues have been addressed and update has been discussed with Dr. Wynn Banker  and patient now appropriate for inpatient rehabilitation. Will admit to inpatient rehab today.   Preadmission Screen Completed By:  Fae Pippin, 01/20/2016 1:13 PM ______________________________________________________________________    Discussed status with Dr. Wynn Banker on 01/20/16 at 1325 and received telephone approval for admission today.  Admission Coordinator:  Fae Pippin, time 1325/Date 01/20/16

## 2016-01-20 NOTE — Clinical Social Work Note (Signed)
CSW consulted for IP rehab. Pt will admit to CIR today. CSW is signing off as no further needs identified.   Dede QuerySarah Trevonn Hallum, MSW, LCSW Clinical Social Worker  [phone]

## 2016-01-20 NOTE — Discharge Summary (Signed)
Physician Discharge Summary  [name] [name]. IEP:[id number] DOB: 1966-01-06 DOA: 01/17/2016  PCP: No primary care provider on file.  Admit date: 01/17/2016 Discharge date: 01/20/2016  Time spent: 65 minutes  Recommendations for Outpatient Follow-up:  1. Patient be discharged to inpatient rehabilitation. 2. Post discharge from inpatient rehabilitation patient wanted to follow-up with PCP as outpatient in 1-2 weeks for follow-up on blood pressure and diabetes management. Patient will need a basic metabolic profile done to follow-up on electrolytes and renal function. 3. Follow-up with Dr. Silverio Lay NP with [facility] in 2 months.   Discharge Diagnoses:  Active Problems:   Stroke-like symptoms   Acute right-sided weakness   Accelerated hypertension   Right sided weakness   Cerebrovascular accident (CVA) due to thrombosis of basilar artery (HCC)   HLD (hyperlipidemia)   Tobacco use disorder   Cerebral thrombosis with cerebral infarction   Diabetes ([place])   Stroke ([place])   Benign essential HTN   Discharge Condition: Stable and improved  Diet recommendation: Carb modified diet.  Filed Weights   01/17/16 1543  Weight: 117.9 kg (260 lb)    History of present illness:  Per Dr Barbaraann Cao [name] [name]. is a 50 y.o. male with medical history significant for HTN, HLD and recently admitted for R sided weakness and slurred speech from 8/5-01/16/2016, discharged on  Aspirin, and anti hypertensive, and cholecystectomy meds.  He had a full evaluation including MRI of the brain and CT and you was performed, the MRI revealing acute infarct in the left pontine region as well as evidence of a chronic infarct in the right thalamic region. He presented again on the day of admission, reporting right upper and lower extremity "heaviness", progressing the morning of admission, to the point that he was barely able to move his right upper and lower extremities, with some slurring of his speech. He also  noted right facial droop. Neurology evaluated the patient, presuming this was a new ischemic event . TPA was not administered, as the symptoms were first noted at 9 p.m.1 day prior to admission, outside of the window for intervention. Denied any headaches, vision changes. 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 No dysphagia.  No nausea or vomiting. No chest pain or shortness of breath. No abdominal pain. No confusion or seizure activity. Risk factors include a history of obesity, varicose veins, smoking about 2 packs per day since young age, hypertension, likely DM as per Hb A1C 8.3 on last admission. Denies any history of recreational drug use. The patient drinks multiple times a week, but without a history of withdrawal. Denied any recent long distance trips, he denied any hormonal supplements. Patient is not compliant with meds. Wife reported he did not take his BP meds prior to admission." he has them in the car". No family history of strokes.  ED Course:  BP (!) 179/105   Pulse 68   Temp 99.4 F (37.4 C) (Oral)   Resp 16   SpO2 96%    MRI of the brain without contrast pending 2-D echo performed on 8/6 Normal LV systolic function; grade 2 diastolic dysfunction;severe LVH more prominent in the septum; no SAM; possible hypertrophic cardiomyopathy EF 55-60 Glucose 177. Hb A1C 8.5   Hospital Course:  #1 right point in infarct with evolution/initial infarct secondary to small vessel disease Patient had presented with worsening right-sided weakness from initial stroke and felt per neurology to be related to dehydration and  overexertion with resultant right hemiparesis and some dysarthria. MRI of the head done showed no new infarct. MRA with no significant flow-limiting stenosis per neurology. Fasting lipid panel with LDL of 77. Hemoglobin A1c is 8.5. Patient was placed on a carb modified diet. Continued on aspirin 325 mg daily for secondary  stroke prevention. Risk factor modification. Patient was hydrated gently with IV fluids. Patient was placed on a nicotine patch for tobacco cessation. Patient was assessed by physical therapy recommended inpatient rehabilitation. Patient be discharged to inpatient rehabilitation.  #2 hypertension BP was elevated on admission and initially permissive hypertension. Patient was subsequently started back on home regimen of lisinopril and HCTZ. It was recommended to gradually normalize blood pressure of 5-7 days. Lisinopril HCTZ may be titrated as needed.   #3 hyperlipidemia LDL at 77. Goal LDL less than 70. Patient was maintained on a statin.   #4 tobacco abuse Tobacco cessation. Patient was placed on a nicotine patch.  #5 diabetes mellitus Hemoglobin A1c was 8.5 from 01/15/2016. Patient was maintained on sliding scale insulin throughout the hospitalization.  On discharge from inpatient rehabilitation, may consider starting patient on metformin versus glipizide. Outpatient follow-up.  Procedures:  CT head without contrast 01/17/2016  Chest x-ray 01/17/2016  MRI brain/MRA head and neck 01/18/2016   Consultations:  Neurology: Dr. Tasia Catchings 01/17/2016  Discharge Exam: Vitals:   01/20/16 0949 01/20/16 1329  BP: (!) 168/98 (!) 158/93  Pulse: 66 71  Resp: 18 18  Temp: 98.3 F (36.8 C) 98.3 F (36.8 C)    General: NAD Cardiovascular: RRR Respiratory: CTAB  Discharge Instructions   Discharge Instructions    Ambulatory referral to Nutrition and Diabetic Education    Complete by:  As directed     Discharge Medication List as of 01/20/2016  4:53 PM    START taking these medications   Details  blood glucose meter kit and supplies Dispense based on patient and insurance preference. Use up to four times daily as directed. (FOR ICD-9 250.00, 250.01)., Print      CONTINUE these medications which have NOT CHANGED   Details  aspirin 325 MG tablet Take 1 tablet (325 mg total)  by mouth daily., Starting Sun 01/16/2016, Normal    atorvastatin (LIPITOR) 20 MG tablet Take 1 tablet (20 mg total) by mouth daily at 6 PM., Starting Sun 01/16/2016, Normal    lisinopril-hydrochlorothiazide (ZESTORETIC) 20-12.5 MG per tablet Take 1 tablet by mouth daily., Starting Fri 04/17/2014, Normal    fluticasone (FLONASE) 50 MCG/ACT nasal spray Place 1-2 sprays into both nostrils daily as needed for rhinitis. , Historical Med       Allergies  Allergen Reactions  . Morphine And Related Itching   Follow-up Information    [facility] Follow up on 02/22/2016.   Why:  Appointment time is 11:45 am Contact information: [phone] [place], [place]           The results of significant diagnostics from this hospitalization (including imaging, microbiology, ancillary and laboratory) are listed below for reference.    Significant Diagnostic Studies: Ct Angio Head W Or Wo Contrast  Result Date: 01/14/2016 CLINICAL DATA:  Stroke.  Right-sided weakness. EXAM: CT ANGIOGRAPHY HEAD AND NECK TECHNIQUE: Multidetector CT imaging of the head and neck was performed using the standard protocol during bolus administration of intravenous contrast. Multiplanar CT image reconstructions and MIPs were obtained to evaluate the vascular anatomy. Carotid stenosis measurements (when applicable) are obtained utilizing NASCET criteria, using  the distal internal carotid diameter as the denominator. CONTRAST:  50 mL Isovue 370 COMPARISON:  None. FINDINGS: CTA NECK Aortic arch: 3 vessel aortic arch. Brachiocephalic and subclavian arteries are widely patent. Right carotid system: Mild-to-moderate mixed calcified and noncalcified plaque at the carotid bifurcation and in the proximal ICA without significant stenosis. Left carotid system: Mild plaque scattered in the proximal and mid common carotid artery without stenosis. Mild-to-moderate predominantly noncalcified plaque  at the carotid bifurcation and in the proximal ICA without stenosis. Vertebral arteries:The vertebral arteries are patent and codominant. No significant stenosis is identified, although evaluation of the proximal vertebral arteries is limited in some areas by quantum mottle from patient body habitus. Skeleton: Mild cervical spondylosis. Other neck: No neck mass. Upper chest: Grossly clear lung apices. CTA HEAD Anterior circulation: The internal carotid arteries are patent from skullbase to carotid termini without stenosis. ACAs and MCAs are patent without evidence of major branch occlusion or significant proximal stenosis. The right A1 segment is dominant. There is a diminished number of the distal left MCA branch vessels compared to the right. No intracranial aneurysm is identified. Posterior circulation: The intracranial vertebral arteries are patent to the basilar without significant stenosis. Left PICA origin is patent. Right PICA is not identified. SCA origins are grossly patent. Basilar artery is patent without stenosis. There is a fetal type origin of the left PCA. No significant proximal PCA stenosis is seen. Venous sinuses: Patent.  Hypoplastic left transverse sinus. Anatomic variants: Fetal type origin of the left PCA. IMPRESSION: 1. No emergent large vessel occlusion or flow limiting proximal stenosis. 2. Decreased number of  distal left MCA branch vessels. 3. Mild cervical carotid artery atherosclerosis without significant stenosis. Preliminary findings of no large vessel occlusion reviewed in person with Dr. Leonel Ramsay on 01/14/2016 at 9:43 p.m. Electronically Signed   By: Logan Bores M.D.   On: 01/14/2016 22:27   Dg Chest 2 View  Result Date: 01/17/2016 CLINICAL DATA:  Patient admitted with a stroke on 01/14/2016. Inability to move the right arm and severe right leg weakness today. Slurred speech. EXAM: CHEST  2 VIEW COMPARISON:  None. FINDINGS: The lungs are clear. Heart size is upper normal. No  pneumothorax or pleural effusion. No focal bony abnormality. IMPRESSION: No acute disease. Electronically Signed   By: Inge Rise M.D.   On: 01/17/2016 16:35   Ct Head Wo Contrast  Result Date: 01/17/2016 CLINICAL DATA:  50 year old male with worsening right arm weakness and right facial droop since yesterday. Recent stroke. Subsequent encounter. EXAM: CT HEAD WITHOUT CONTRAST TECHNIQUE: Contiguous axial images were obtained from the base of the skull through the vertex without intravenous contrast. COMPARISON:  01/15/2016 MR. 01/14/2016 CT angiogram head and neck and CT. FINDINGS: Brain: Acute left lower pontine infarct may have progressed since prior exam and is more conspicuous on the present CT. Chronic microvascular CT moderate chronic microvascular changes. No intracranial hemorrhage. Prominent basal ganglia calcifications/mineralization unchanged. No hydrocephalus. No intracranial mass lesion noted on this unenhanced exam. Vascular: No hyperdense major vessel. Skull: No acute abnormality. Sinuses/Orbits: Moderate opacification right maxillary sinus. Minimal mucosal thickening left maxillary sinus. Partial opacification ethmoid sinus air cells bilaterally. Other: Negative. IMPRESSION: Acute left lower pontine infarct may have progressed since prior exam and is more conspicuous on the present CT. Moderate microvascular changes. No intracranial hemorrhage. Moderate opacification right maxillary sinus. Minimal mucosal thickening left maxillary sinus. Partial opacification ethmoid sinus air cells bilaterally. Electronically Signed   By: Alcide Evener.D.  On: 01/17/2016 10:36   Ct Angio Neck W And/or Wo Contrast  Result Date: 01/14/2016 CLINICAL DATA:  Stroke.  Right-sided weakness. EXAM: CT ANGIOGRAPHY HEAD AND NECK TECHNIQUE: Multidetector CT imaging of the head and neck was performed using the standard protocol during bolus administration of intravenous contrast. Multiplanar CT image  reconstructions and MIPs were obtained to evaluate the vascular anatomy. Carotid stenosis measurements (when applicable) are obtained utilizing NASCET criteria, using the distal internal carotid diameter as the denominator. CONTRAST:  50 mL Isovue 370 COMPARISON:  None. FINDINGS: CTA NECK Aortic arch: 3 vessel aortic arch. Brachiocephalic and subclavian arteries are widely patent. Right carotid system: Mild-to-moderate mixed calcified and noncalcified plaque at the carotid bifurcation and in the proximal ICA without significant stenosis. Left carotid system: Mild plaque scattered in the proximal and mid common carotid artery without stenosis. Mild-to-moderate predominantly noncalcified plaque at the carotid bifurcation and in the proximal ICA without stenosis. Vertebral arteries:The vertebral arteries are patent and codominant. No significant stenosis is identified, although evaluation of the proximal vertebral arteries is limited in some areas by quantum mottle from patient body habitus. Skeleton: Mild cervical spondylosis. Other neck: No neck mass. Upper chest: Grossly clear lung apices. CTA HEAD Anterior circulation: The internal carotid arteries are patent from skullbase to carotid termini without stenosis. ACAs and MCAs are patent without evidence of major branch occlusion or significant proximal stenosis. The right A1 segment is dominant. There is a diminished number of the distal left MCA branch vessels compared to the right. No intracranial aneurysm is identified. Posterior circulation: The intracranial vertebral arteries are patent to the basilar without significant stenosis. Left PICA origin is patent. Right PICA is not identified. SCA origins are grossly patent. Basilar artery is patent without stenosis. There is a fetal type origin of the left PCA. No significant proximal PCA stenosis is seen. Venous sinuses: Patent.  Hypoplastic left transverse sinus. Anatomic variants: Fetal type origin of the left  PCA. IMPRESSION: 1. No emergent large vessel occlusion or flow limiting proximal stenosis. 2. Decreased number of  distal left MCA branch vessels. 3. Mild cervical carotid artery atherosclerosis without significant stenosis. Preliminary findings of no large vessel occlusion reviewed in person with Dr. Leonel Ramsay on 01/14/2016 at 9:43 p.m. Electronically Signed   By: Logan Bores M.D.   On: 01/14/2016 22:27   Mr Angiogram Neck W Wo Contrast  Result Date: 01/18/2016 CLINICAL DATA:  Recent left pontine stroke with worsening symptoms of right-sided weakness. EXAM: MR HEAD WITHOUT CONTRAST MR CIRCLE OF WILLIS WITHOUT CONTRAST MRA OF THE NECK WITHOUT AND WITH CONTRAST TECHNIQUE: Multiplanar, multiecho pulse sequences of the brain and surrounding structures were obtained according to standard protocol without intravenous contrast.; Multiplanar and multiecho pulse sequences of the neck were obtained without and with intravenous contrast. Angiographic images of the neck were obtained using MRA technique without and with intravenous contast.; Angiographic images of the Circle of Willis were obtained using MRA technique without intravenous contrast. CONTRAST:  35m MULTIHANCE GADOBENATE DIMEGLUMINE 529 MG/ML IV SOLN COMPARISON:  MRI 01/15/2016.  CTA 01/14/2016 FINDINGS: MR HEAD FINDINGS Restricted diffusion in the left lower ventral pons is unchanged in size from the recent MRI. No new area of acute infarct since the prior study. Generalized atrophy. Moderate chronic microvascular ischemic change throughout the white matter. Chronic lacunar infarction left middle cerebellar peduncle unchanged. Negative for hemorrhage or mass.  No shift of the midline structures Mucosal edema throughout the paranasal sinuses right greater than left unchanged. Pituitary not enlarged.  Normal orbital structures. MR CIRCLE OF WILLIS FINDINGS Image quality degraded by mild motion. There is misregistration in the mid basilar and internal carotid  artery due to patient motion Both vertebral arteries patent to the basilar with mild stenosis distally in both vertebral arteries. Basilar widely patent. Superior cerebellar and posterior cerebral arteries patent. Fetal origin left posterior cerebral artery. Left PICA patent. Right PICA not visualized. Internal carotid artery patent bilaterally without stenosis. Anterior and middle cerebral arteries patent bilaterally without stenosis or branch occlusion. Negative for cerebral vascular malformation. MRA NECK FINDINGS Carotid bifurcation widely patent bilaterally without significant stenosis. Mild atherosclerotic disease in the left carotid bulb. Both vertebral arteries patent to the basilar. Moderate stenosis at the origin of the vertebral artery bilaterally. Mild stenosis distal vertebral artery bilaterally. IMPRESSION: Restricted diffusion left pons unchanged from the recent MRI compatible with subacute infarct. No new area of infarct since the prior MRI of 01/14/2014. Moderate chronic microvascular ischemic change. 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 Basilar is patent. Electronically Signed   By: Franchot Gallo M.D.   On: 01/18/2016 11:15   Mr Brain Wo Contrast  Result Date: 01/18/2016 CLINICAL DATA:  Recent left pontine stroke with worsening symptoms of right-sided weakness. EXAM: MR HEAD WITHOUT CONTRAST MR CIRCLE OF WILLIS WITHOUT CONTRAST MRA OF THE NECK WITHOUT AND WITH CONTRAST TECHNIQUE: Multiplanar, multiecho pulse sequences of the brain and surrounding structures were obtained according to standard protocol without intravenous contrast.; Multiplanar and multiecho pulse sequences of the neck were obtained without and with intravenous contrast. Angiographic images of the neck were obtained using MRA technique without and with intravenous contast.; Angiographic images of the  Circle of Willis were obtained using MRA technique without intravenous contrast. CONTRAST:  40m MULTIHANCE GADOBENATE DIMEGLUMINE 529 MG/ML IV SOLN COMPARISON:  MRI 01/15/2016.  CTA 01/14/2016 FINDINGS: MR HEAD FINDINGS Restricted diffusion in the left lower ventral pons is unchanged in size from the recent MRI. No new area of acute infarct since the prior study. Generalized atrophy. Moderate chronic microvascular ischemic change throughout the white matter. Chronic lacunar infarction left middle cerebellar peduncle unchanged. Negative for hemorrhage or mass.  No shift of the midline structures Mucosal edema throughout the paranasal sinuses right greater than left unchanged. Pituitary not enlarged. Normal orbital structures. MR CIRCLE OF WILLIS FINDINGS Image quality degraded by mild motion. There is misregistration in the mid basilar and internal carotid artery due to patient motion Both vertebral arteries patent to the basilar with mild stenosis distally in both vertebral arteries. Basilar widely patent. Superior cerebellar and posterior cerebral arteries patent. Fetal origin left posterior cerebral artery. Left PICA patent. Right PICA not visualized. Internal carotid artery patent bilaterally without stenosis. Anterior and middle cerebral arteries patent bilaterally without stenosis or branch occlusion. Negative for cerebral vascular malformation. MRA NECK FINDINGS Carotid bifurcation widely patent bilaterally without significant stenosis. Mild atherosclerotic disease in the left carotid bulb. Both vertebral arteries patent to the basilar. Moderate stenosis at the origin of the vertebral artery bilaterally. Mild stenosis distal vertebral artery bilaterally. IMPRESSION: Restricted diffusion left pons unchanged from the recent MRI compatible with subacute infarct. No new area of infarct since the prior MRI of 01/14/2014. Moderate chronic microvascular ischemic change. No significant carotid stenosis in the neck  Moderate stenosis at the origin of both vertebral arteries and mild stenosis of the distal vertebral artery bilaterally. Basilar is patent. Electronically Signed  By: Franchot Gallo M.D.   On: 01/18/2016 11:15   Mr Brain Wo Contrast  Result Date: 01/15/2016 CLINICAL DATA:  Right-sided arm and leg weakness and slurred speech. EXAM: MRI HEAD WITHOUT CONTRAST TECHNIQUE: Multiplanar, multiecho pulse sequences of the brain and surrounding structures were obtained without intravenous contrast. COMPARISON:  Head CT 01/14/2016 FINDINGS: There is a 7 mm acute infarct inferiorly in the pons on the left. There is no evidence intracranial hemorrhage, mass, midline shift, or extra-axial fluid collection. Ventricles and sulci are normal. Patchy to confluent T2 hyperintensities are present in the cerebral white matter and pons, nonspecific but compatible with age advanced, extensive small vessel ischemic disease in this patient with hypertension and hyperlipidemia. There is a chronic lacunar infarct at the posterior aspect of the left middle cerebellar peduncle. There is no evidence of infarct in the thalamus, and the small area of abnormal density on CT was likely artifactual. Orbits are unremarkable. Mild mucosal thickening is present in the left frontal, left ethmoid, and right maxillary sinuses. Mastoid air cells are clear. Major intracranial vascular flow voids are preserved. No focal osseous lesion is identified. IMPRESSION: 1. Acute subcentimeter infarct in the left pons. 2. Extensive chronic small vessel ischemic disease. Electronically Signed   By: Logan Bores M.D.   On: 01/15/2016 07:18   Mr Jodene Nam Head/brain LY Cm  Result Date: 01/18/2016 CLINICAL DATA:  Recent left pontine stroke with worsening symptoms of right-sided weakness. EXAM: MR HEAD WITHOUT CONTRAST MR CIRCLE OF WILLIS WITHOUT CONTRAST MRA OF THE NECK WITHOUT AND WITH CONTRAST TECHNIQUE: Multiplanar, multiecho pulse sequences of the brain and surrounding  structures were obtained according to standard protocol without intravenous contrast.; Multiplanar and multiecho pulse sequences of the neck were obtained without and with intravenous contrast. Angiographic images of the neck were obtained using MRA technique without and with intravenous contast.; Angiographic images of the Circle of Willis were obtained using MRA technique without intravenous contrast. CONTRAST:  77m MULTIHANCE GADOBENATE DIMEGLUMINE 529 MG/ML IV SOLN COMPARISON:  MRI 01/15/2016.  CTA 01/14/2016 FINDINGS: MR HEAD FINDINGS Restricted diffusion in the left lower ventral pons is unchanged in size from the recent MRI. No new area of acute infarct since the prior study. Generalized atrophy. Moderate chronic microvascular ischemic change throughout the white matter. Chronic lacunar infarction left middle cerebellar peduncle unchanged. Negative for hemorrhage or mass.  No shift of the midline structures Mucosal edema throughout the paranasal sinuses right greater than left unchanged. Pituitary not enlarged. Normal orbital structures. MR CIRCLE OF WILLIS FINDINGS Image quality degraded by mild motion. There is misregistration in the mid basilar and internal carotid artery due to patient motion Both vertebral arteries patent to the basilar with mild stenosis distally in both vertebral arteries. Basilar widely patent. Superior cerebellar and posterior cerebral arteries patent. Fetal origin left posterior cerebral artery. Left PICA patent. Right PICA not visualized. Internal carotid artery patent bilaterally without stenosis. Anterior and middle cerebral arteries patent bilaterally without stenosis or branch occlusion. Negative for cerebral vascular malformation. MRA NECK FINDINGS Carotid bifurcation widely patent bilaterally without significant stenosis. Mild atherosclerotic disease in the left carotid bulb. Both vertebral arteries patent to the basilar. Moderate stenosis at the origin of the vertebral  artery bilaterally. Mild stenosis distal vertebral artery bilaterally. IMPRESSION: Restricted diffusion left pons unchanged from the recent MRI compatible with subacute infarct. No new area of infarct since the prior MRI of 01/14/2014. Moderate chronic microvascular ischemic change. No significant carotid stenosis in the neck Moderate stenosis at  the origin of both vertebral arteries and mild stenosis of the distal vertebral artery bilaterally. Basilar is patent. Electronically Signed   By: Franchot Gallo M.D.   On: 01/18/2016 11:15   Ct Head Code Stroke W/o Cm  Result Date: 01/14/2016 CLINICAL DATA:  Code stroke. Left-sided numbness. Slurred speech. History of hypertension and hyperlipidemia. EXAM: CT HEAD WITHOUT CONTRAST TECHNIQUE: Contiguous axial images were obtained from the base of the skull through the vertex without intravenous contrast. COMPARISON:  None. FINDINGS: There is no evidence of acute cortical infarct, intracranial hemorrhage, mass, midline shift, or extra-axial fluid collection. The ventricles and sulci are normal. There are extensive, patchy and confluent hypodensities in the cerebral white matter bilaterally. There is a 7 mm focus of mild hypoattenuation in the right thalamus suggestive of a lacunar infarct and of indeterminate age. Orbits are unremarkable. Mild right maxillary sinus mucosal thickening is partially visualized. The mastoid air cells are clear. No acute osseous abnormality is identified. ASPECTS [facility] Stroke Program Early CT Score, [email or web address]) - Ganglionic level infarction (caudate, lentiform nuclei, internal capsule, insula, M1-M3 cortex): 7 - Supraganglionic infarction (M4-M6 cortex): 3 Total score (0-10 with 10 being normal): 10 IMPRESSION: 1. No evidence of acute intracranial hemorrhage or acute cortical infarct. 2. Age indeterminate lacunar infarct in the right thalamus, possibly recent. 3. ASPECTS score 10. 4. Extensive cerebral white matter  disease, markedly advanced for age and nonspecific but may reflect chronic small vessel ischemia. These results were called by telephone at the time of interpretation on 01/14/2016 at 8:13 pm to Dr. Billy Fischer, who verbally acknowledged these results. Electronically Signed   By: Logan Bores M.D.   On: 01/14/2016 20:16    Microbiology: No results found for this or any previous visit (from the past 240 hour(s)).   Labs: Basic Metabolic Panel:  Recent Labs Lab 01/14/16 1954  01/14/16 2056 01/16/16 0714 01/17/16 1024 01/19/16 0910 01/20/16 0158  NA 139  < > 143 138 137 137 138  K 4.2  < > 3.3* 3.5 3.6 3.7 3.3*  CL 103  < > 104 106 105 104 105  CO2 22  --   --  _0 GLUCOSE 158*  < > 173* 178* 177* 252* 121*  BUN 29*  < > 28* 21* _1 CREATININE 1.53*  < > 1.30* 0.89 0.77 0.78 0.69  CALCIUM 9.3  --   --  8.9 8.7* 8.7* 8.5*  < > = values in this interval not displayed. Liver Function Tests:  Recent Labs Lab 01/14/16 1954 01/17/16 1024  AST 48* 25  ALT 29 26  ALKPHOS 70 55  BILITOT 1.7* 0.8  PROT 7.7 6.2*  ALBUMIN 4.4 3.4*   No results for input(s): LIPASE, AMYLASE in the last 168 hours. No results for input(s): AMMONIA in the last 168 hours. CBC:  Recent Labs Lab 01/14/16 1954 01/14/16 2005 01/14/16 2056 01/17/16 1024  WBC 10.8*  --   --  9.1  NEUTROABS 7.8*  --   --  6.6  HGB 17.5* 17.3* 16.7 15.3  HCT 50.5 51.0 49.0 46.1  MCV 92.8  --   --  95.1  PLT 178  --   --  154   Cardiac Enzymes: No results for input(s): CKTOTAL, CKMB, CKMBINDEX, TROPONINI in the last 168 hours. BNP: BNP (last 3 results) No results for input(s): BNP in the last 8760 hours.  ProBNP (last 3 results) No results for input(s): PROBNP in the last 8760 hours.  CBG:  Recent Labs Lab 01/19/16 1608 01/19/16 2105 01/20/16 0602 01/20/16 1119 01/20/16 1638  GLUCAP 96 119* 122* 135* 110*       Signed:  Lemario Chaikin MD.  [facility] 01/20/2016, 6:13 PM

## 2016-01-21 ENCOUNTER — Inpatient Hospital Stay (HOSPITAL_COMMUNITY): Payer: Managed Care, Other (non HMO) | Admitting: Occupational Therapy

## 2016-01-21 ENCOUNTER — Inpatient Hospital Stay (HOSPITAL_COMMUNITY): Payer: Managed Care, Other (non HMO) | Admitting: Speech Pathology

## 2016-01-21 ENCOUNTER — Inpatient Hospital Stay (HOSPITAL_COMMUNITY): Payer: Managed Care, Other (non HMO) | Admitting: Physical Therapy

## 2016-01-21 DIAGNOSIS — E1165 Type 2 diabetes mellitus with hyperglycemia: Secondary | ICD-10-CM

## 2016-01-21 DIAGNOSIS — E1159 Type 2 diabetes mellitus with other circulatory complications: Secondary | ICD-10-CM

## 2016-01-21 DIAGNOSIS — F17200 Nicotine dependence, unspecified, uncomplicated: Secondary | ICD-10-CM

## 2016-01-21 DIAGNOSIS — I6350 Cerebral infarction due to unspecified occlusion or stenosis of unspecified cerebral artery: Secondary | ICD-10-CM

## 2016-01-21 DIAGNOSIS — G8191 Hemiplegia, unspecified affecting right dominant side: Secondary | ICD-10-CM

## 2016-01-21 DIAGNOSIS — I10 Essential (primary) hypertension: Secondary | ICD-10-CM

## 2016-01-21 LAB — COMPREHENSIVE METABOLIC PANEL
ALK PHOS: 63 U/L (ref 38–126)
ALT: 53 U/L (ref 17–63)
AST: 47 U/L — AB (ref 15–41)
Albumin: 3.5 g/dL (ref 3.5–5.0)
Anion gap: 11 (ref 5–15)
BILIRUBIN TOTAL: 1.1 mg/dL (ref 0.3–1.2)
BUN: 15 mg/dL (ref 6–20)
CALCIUM: 9.1 mg/dL (ref 8.9–10.3)
CHLORIDE: 102 mmol/L (ref 101–111)
CO2: 26 mmol/L (ref 22–32)
CREATININE: 0.74 mg/dL (ref 0.61–1.24)
Glucose, Bld: 140 mg/dL — ABNORMAL HIGH (ref 65–99)
Potassium: 3.6 mmol/L (ref 3.5–5.1)
Sodium: 139 mmol/L (ref 135–145)
TOTAL PROTEIN: 6.5 g/dL (ref 6.5–8.1)

## 2016-01-21 LAB — CBC WITH DIFFERENTIAL/PLATELET
BASOS ABS: 0 10*3/uL (ref 0.0–0.1)
Basophils Relative: 0 %
EOS PCT: 3 %
Eosinophils Absolute: 0.2 10*3/uL (ref 0.0–0.7)
HEMATOCRIT: 47.3 % (ref 39.0–52.0)
HEMOGLOBIN: 15.5 g/dL (ref 13.0–17.0)
LYMPHS ABS: 2.2 10*3/uL (ref 0.7–4.0)
LYMPHS PCT: 27 %
MCH: 31 pg (ref 26.0–34.0)
MCHC: 32.8 g/dL (ref 30.0–36.0)
MCV: 94.6 fL (ref 78.0–100.0)
Monocytes Absolute: 0.9 10*3/uL (ref 0.1–1.0)
Monocytes Relative: 11 %
NEUTROS ABS: 5 10*3/uL (ref 1.7–7.7)
Neutrophils Relative %: 59 %
PLATELETS: 194 10*3/uL (ref 150–400)
RBC: 5 MIL/uL (ref 4.22–5.81)
RDW: 13 % (ref 11.5–15.5)
WBC: 8.3 10*3/uL (ref 4.0–10.5)

## 2016-01-21 LAB — GLUCOSE, CAPILLARY
GLUCOSE-CAPILLARY: 119 mg/dL — AB (ref 65–99)
GLUCOSE-CAPILLARY: 130 mg/dL — AB (ref 65–99)
Glucose-Capillary: 137 mg/dL — ABNORMAL HIGH (ref 65–99)
Glucose-Capillary: 108 mg/dL — ABNORMAL HIGH (ref 65–99)

## 2016-01-21 MED ORDER — METFORMIN HCL 500 MG PO TABS
500.0000 mg | ORAL_TABLET | Freq: Every day | ORAL | Status: DC
  Administered 2016-01-21 – 2016-01-24 (×4): 500 mg via ORAL
  Filled 2016-01-21 (×4): qty 1

## 2016-01-21 NOTE — Evaluation (Signed)
Speech Language Pathology Assessment and Plan  Patient Details  Name: [name] [name]. MRN: [id number] Date of Birth: [birth year]  SLP Diagnosis: Dysarthria  Rehab Potential: Good ELOS: 7-10 days    Today's Date: 01/21/2016 SLP Individual Time: 3716-9678 SLP Individual Time Calculation (min): 55 min    Problem List: Patient Active Problem List   Diagnosis Date Noted  . Left pontine CVA ([place]) 01/20/2016  . Benign essential HTN   . Cerebral thrombosis with cerebral infarction 01/17/2016  . Type 2 diabetes mellitus with hyperglycemia, without long-term current use of insulin ([place]) 01/17/2016  . Stroke ([place]) 01/17/2016  . Cerebral infarction due to unspecified mechanism 01/15/2016  . Cerebrovascular accident (CVA) due to thrombosis of basilar artery ([place])   . HLD (hyperlipidemia)   . Tobacco use disorder   . Stroke-like symptoms 01/14/2016  . Acute right-sided weakness 01/14/2016  . AKI (acute kidney injury) ([place]) 01/14/2016  . Accelerated hypertension 01/14/2016  . Right sided weakness 01/14/2016   Past Medical History:  Past Medical History:  Diagnosis Date  . Allergy   . Hernia, umbilical   . Hyperlipidemia   . Hypertension    Past Surgical History:  Past Surgical History:  Procedure Laterality Date  . UVULOPALATOPHARYNGOPLASTY    . VARICOSE VEIN SURGERY      Assessment / Plan / Recommendation Clinical Impression  [name] Penais a 50 y.o.right handed male recently admitted to the hospital with a small left pontine infarct 01/15/2016 discharged to home 01/16/2016 at supervision level maintained on aspirin. Presented back 01/17/2016 with increasing right sided weakness as well as right facial droopand slurred speech. Patient did not receive TPA. MRI of the brain did not show any new areas of infarct suspect evolution of prior pontine infarct. Neurology follow-up suspect evolution of left pontine infarct. Patient was admitted for a comprehensive rehabilitation  program on 01/20/2016.  SLP evaluation completed on 01/21/2016 with the following results:  Pt presents with mild dysarthria characterized by imprecise articulation of consonants at the conversational level resulting from right sided labial and lingual weakness.  Pt slightly concerned about the way his speech sounds, stating "It sounds like I've had 2-3 beers."  Cognition grossly intact per formal assessment ([employer]).  Pt slightly impulsive; however, suspect this to be pt's personality versus impairment secondary to stroke.   SLP also administered a bedside swallow evaluation today given oral motor weakness.  Pt able to masticate and clear regular solids from the oral cavity without difficulty.  No overt s/s of aspiration evident with solids or liquids. Pt reports overall good toleration of current diet.  As a result, do not recommend additional ST follow up for dysphagia.   Pt would benefit from skilled ST while inpatient for dysarthria in order to maximize functional independence for communication and reduce burden of care prior to discharge.  ST follow up needs TBD pending progress made while inpatient although not anticipated given pt's high level of function.     Skilled Therapeutic Interventions          Cognitive-linguistic evaluation completed with results and recommendations reviewed with family.  Therapist reviewed and reinforced speech intelligibility strategies which were provided to him prior to CIR admission. Handout was posted in pt's room to maximize carryover in between therapy sessions.  SLP also discussed unit's safety precautions given pt's tendency towards impulsivity.  Both pt and pt's daughter who was present endorse these behaviors to be baseline.  Pt was left in room sitting at edge of bed with  daughter at bedside.  Pt reported he may need to go the bathroom soon but declined to go with therapist.  Reinforced that pt was to call and ask for assistance prior getting up to go to  bathroom.     SLP Assessment  Patient will need skilled Speech Lanaguage Pathology Services during CIR admission    Recommendations  SLP Diet Recommendations: Age appropriate regular solids;Thin Liquid Administration via: Cup;Straw Medication Administration: Whole meds with liquid Supervision: Patient able to self feed Compensations: Slow rate;Small sips/bites Oral Care Recommendations: Oral care BID Patient destination: Home Follow up Recommendations: Other (comment) (TBD pending progress made while inpatient ) Equipment Recommended: None recommended by SLP    SLP Frequency 1 to 3 out of 7 days   SLP Duration  SLP Intensity  SLP Treatment/Interventions 7-10 days  Minumum of 1-2 x/day, 30 to 90 minutes  Cueing hierarchy;Environmental controls;Internal/external aids;Patient/family education    Pain Pain Assessment Pain Assessment: No/denies pain  Prior Functioning Cognitive/Linguistic Baseline: Within functional limits Type of Home: House  Lives With: Family Available Help at Discharge: Family;Friend(s);Available PRN/intermittently Vocation: Full time employment  Function:  Eating Eating   Modified Consistency Diet: No Eating Assist Level: Swallowing techniques: self managed           Cognition Comprehension Comprehension assist level: Follows complex conversation/direction with no assist  Expression   Expression assist level: Expresses basic 90% of the time/requires cueing < 10% of the time.  Social Interaction Social Interaction assist level: Interacts appropriately with others - No medications needed.  Problem Solving Problem solving assist level: Solves complex problems: With extra time  Memory Memory assist level: More than reasonable amount of time   Short Term Goals: Week 1: SLP Short Term Goal 1 (Week 1): STG=LTG due to ELOS for ST services  Refer to Care Plan for Long Term Goals  Recommendations for other services: None  Discharge Criteria:  Patient will be discharged from SLP if patient refuses treatment 3 consecutive times without medical reason, if treatment goals not met, if there is a change in medical status, if patient makes no progress towards goals or if patient is discharged from hospital.  The above assessment, treatment plan, treatment alternatives and goals were discussed and mutually agreed upon: by patient  Emilio Math 01/21/2016, 10:01 AM

## 2016-01-21 NOTE — Interval H&P Note (Signed)
[name] RalphHerman Miller Jr. was admitted today to Inpatient Rehabilitation with the diagnosis of left pontine CVA.  The patient's history has been reviewed, patient examined, and there is no change in status.  Patient continues to be appropriate for intensive inpatient rehabilitation.  I have reviewed the patient's chart and labs.  Questions were answered to the patient's satisfaction. The PAPE has been reviewed and assessment remains appropriate.  [name] [name] 01/21/2016, 10:08 AM

## 2016-01-21 NOTE — Care Management Note (Signed)
Inpatient Rehabilitation Center Individual Statement of Services  Patient Name:  [name] RalphHerman Vaughan Jr.  Date:  01/21/2016  Welcome to the Inpatient Rehabilitation Center.  Our goal is to provide you with an individualized program based on your diagnosis and situation, designed to meet your specific needs.  With this comprehensive rehabilitation program, you will be expected to participate in at least 3 hours of rehabilitation therapies Monday-Friday, with modified therapy programming on the weekends.  Your rehabilitation program will include the following services:  Physical Therapy (PT), Occupational Therapy (OT), Speech Therapy (ST), 24 hour per day rehabilitation nursing, Therapeutic Recreaction (TR), Neuropsychology, Case Management (Social Worker), Rehabilitation Medicine, Nutrition Services and Pharmacy Services  Weekly team conferences will be held on Wednesday to discuss your progress.  Your Social Worker will talk with you frequently to get your input and to update you on team discussions.  Team conferences with you and your family in attendance may also be held.  Expected length of stay: 10-14 days  Overall anticipated outcome: mod/i-supervision level  Depending on your progress and recovery, your program may change. Your Social Worker will coordinate services and will keep you informed of any changes. Your Social Worker's name and contact numbers are listed  below.  The following services may also be recommended but are not provided by the Inpatient Rehabilitation Center:   Driving Evaluations  Home Health Rehabiltiation Services  Outpatient Rehabilitation Services  Vocational Rehabilitation   Arrangements will be made to provide these services after discharge if needed.  Arrangements include referral to agencies that provide these services.  Your insurance has been verified to be:  Community education officer[employer]etna Your primary doctor is:  None  Pertinent information will be shared with your doctor and  your insurance company.  Social Worker:  Dossie DerBecky Babetta [place], SW [phone] or [phone]) [phone]  Information discussed with and copy given to patient by: [name] [name], [name] [name], 01/21/2016, 10:51 AM

## 2016-01-21 NOTE — Progress Notes (Signed)
Social Work Assessment and Plan Social Work Assessment and Plan  Patient Details  Name: [name] RalphHerman Noto Jr. MRN: [id number] Date of Birth: Dec 03, 1965  Today's Date: 01/21/2016  Problem List:  Patient Active Problem List   Diagnosis Date Noted  . 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 Benign essential HTN   . Cerebral thrombosis with cerebral infarction 01/17/2016  . Type 2 diabetes mellitus with hyperglycemia, without long-term current use of insulin ([facility]) 01/17/2016  . Stroke ([facility]) 01/17/2016  . Cerebral infarction due to unspecified mechanism 01/15/2016  . Cerebrovascular accident (CVA) due to thrombosis of basilar artery ([facility])   . HLD (hyperlipidemia)   . Tobacco use disorder   . Stroke-like symptoms 01/14/2016  . Acute right-sided weakness 01/14/2016  . AKI (acute kidney injury) ([facility]) 01/14/2016  . Accelerated hypertension 01/14/2016  . Right hemiparesis ([facility]) 01/14/2016   Past Medical History:  Past Medical History:  Diagnosis Date  . Allergy   . Hernia, umbilical   . Hyperlipidemia   . Hypertension    Past Surgical History:  Past Surgical History:  Procedure Laterality Date  . UVULOPALATOPHARYNGOPLASTY    . VARICOSE VEIN SURGERY     Social History:  reports that he has been smoking.  He has been smoking about 2.00 packs per day. He has never used smokeless tobacco. He reports that he drinks alcohol. He reports that he does not use drugs.  Family / Support Systems Marital Status: Married Patient Roles: Spouse, Parent, Other (Comment) ([name]) Spouse/Significant Other: [name] [name] [phone]  [phone] Children: Two grown son's and 51 yo daughter Other Supports: Friends and co-workers Anticipated Caregiver: [name] [name] Ability/Limitations of Caregiver: [name] [name] works fulltime so can only provide evening assistance Caregiver Availability: Evenings only Family Dynamics: Close knit family who are involved with one another. Daughter is here to babysit pt and remind him to not  get up or watch his diet. She seems to be the adult and not the pt. Pt laughs and trys to make humor out of all of this.  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 His daughter is here to watch him and remind him not to get up, she has also ordered his meals so he will be compliant. He has alwasy been independent and taken care of everything, even with other health issues he has managed. Recent Psychosocial Issues: other health issues but does not see a MD-can't be bothered with that, according to pt Pyschiatric History: No history deferred depression screen due to daughter in room, but due to age and his affect will ask neuro-psych to see Monday. He will benefit from this during his stay. Substance Abuse History: Tobacco-2 packs a day and ETOH-feels alcohol is not an issue. Aware of the recommendation to quit smoking and drinking by MD. Will discuss while here.  Patient / Family Perceptions, Expectations & Goals Pt/Family understanding of illness & functional limitations: Pt is able to explain his stroke and his deficits, although feels  it will resolve itself. He talks with the MD and feels his questions are being addressed.  Wife has also spoken with MD also. Premorbid pt/family roles/activities: Husband, father,  employee, friend, co-worker etc Anticipated changes in roles/activities/participation: plans to resume Pt/family expectations/goals: Pt states: " I will manage I always do, I will figure out a way."  Daughter states: " He needs to do what the doctor says, but he is stubborn."  Building surveyor: None Premorbid Home Care/DME Agencies: None Transportation available at discharge: [employer] referrals recommended: Neuropsychology, Support group (specify)  Discharge Planning Living Arrangements: Spouse/significant other, Children Support Systems: Spouse/significant other, Children, Other relatives, Friends/neighbors Type of Residence: Private residence Insurance Resources: Media planner (specify) Administrator) Financial Resources: Employment, Garment/textile technologist Screen Referred: No Living Expenses: Database administrator Management: Spouse, Patient Does the patient have any problems obtaining your medications?: No (Doesn't see MD-non-compliant) Home Management: Mainly wife, pt does some to the outside work Associate Professor Plans: Return home with wife and daughter, wife works during the day and can only be there in the veneings. Daughter is going into 6th grade and will start school soon. Hopefully pt will get to mod/i level to go home safely due to he will go anyway and be alone during the day. Social Work Anticipated Follow Up Needs: HH/OP, Support Group  Clinical Impression Pleasant funny gentleman who laughs off this stroke and deficits. His wife works during the day and can not provide 24 hr care, he has a 62 yo daughter in the home also, who is here today to babysit Dad and make Sure he is doing what he is being told. Have made referral to neuro-psych to see for coping and substance abuse. Pt seems to be too happy with everything he has going on with him. Will await team's evaluations and work on a safe Discharge plan.  Lucy Chris 01/21/2016, 10:48  AM

## 2016-01-21 NOTE — Progress Notes (Signed)
Occupational Therapy Assessment and Plan  Patient Details  Name: [name] [name]. MRN: [id number] Date of Birth: 04/16/1966  OT Diagnosis: hemiplegia affecting dominant side and muscle weakness (generalized) Rehab Potential: Rehab Potential (ACUTE ONLY): Good ELOS: 10-14 days  Today's Date: 01/21/2016 OT Individual Time: 6283-6629 OT Individual Time Calculation (min): 60 min       Problem List: Patient Active Problem List   Diagnosis Date Noted  . 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 Benign essential HTN   . Cerebral thrombosis with cerebral infarction 01/17/2016  . Type 2 diabetes mellitus with hyperglycemia, without long-term current use of insulin ([place]) 01/17/2016  . Stroke ([place]) 01/17/2016  . Cerebral infarction due to unspecified mechanism 01/15/2016  . Cerebrovascular accident (CVA) due to thrombosis of basilar artery ([place])   . HLD (hyperlipidemia)   . Tobacco use disorder   . Stroke-like symptoms 01/14/2016  . Acute right-sided weakness 01/14/2016  . AKI (acute kidney injury) ([place]) 01/14/2016  . Accelerated hypertension 01/14/2016  . Right hemiparesis ([place]) 01/14/2016    Past Medical History:  Past Medical History:  Diagnosis Date  . Allergy   . Hernia, umbilical   . Hyperlipidemia   . Hypertension    Past Surgical History:  Past Surgical History:  Procedure Laterality Date  . UVULOPALATOPHARYNGOPLASTY    . VARICOSE VEIN SURGERY      Assessment & Plan Clinical Impression: Patient is a 50 y.o. year old male admitted to the hospital on 01/20/2016 with history of hypertension, tobacco abuse, Chronic renal insufficiency with baseline creatinine 1.3-1.4 and hyperlipidemia. Per chart review patient lives with spouse in independent prior to admission ambulating without assistive device. 2 level home with 3 steps to entry and bedroom upstairs. Wife works during the day. Patient recently admitted to the hospital with a small left pontine infarct 01/15/2016 discharged  to home 01/16/2016 at supervision level maintained on aspirin. Presented back 01/17/2016 with increasing right sided weakness as well as right facial droop and slurred speech. Patient did not receive TPA. MRI of the brain did not show any new areas of infarct suspect evolution of prior pontine infarct. MRA of head and angiogram of neck showed no significant carotid stenosis in the neck. 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 Neurology follow-up suspect evolution of left pontine infarct. Patient remains on aspirin therapy. Subcutaneous Lovenox added for DVT prophylaxis. Newly diagnosed diabetes mellitus with hemoglobin A1c 8.5 obtained obtained on sliding scale insulin.  Patient transferred to CIR on 01/20/2016 .    Patient currently requires mod with basic self-care skills and IADL secondary to muscle weakness, impaired timing and sequencing, unbalanced muscle activation, decreased coordination and hemiplegia and decreased sitting balance, decreased standing balance, hemiplegia and decreased balance strategies.  Prior to hospitalization, patient could complete ADLs with independent .  Patient will benefit from skilled intervention to decrease level of assist with basic self-care skills, increase independence with basic self-care skills and increase level of independence with iADL prior to discharge home with care partner.  Anticipate patient will require intermittent supervision and follow up outpatient.  OT - End of Session Activity Tolerance: Tolerates 30+ min activity with multiple rests Endurance Deficit: No OT Assessment Rehab Potential (ACUTE ONLY): Good OT Patient demonstrates impairments in the following area(s): Endurance;Motor;Balance;Safety OT Basic ADL's Functional Problem(s): Bathing;Dressing;Toileting;Grooming;Eating OT Transfers Functional Problem(s): Toilet;Tub/Shower OT Additional Impairment(s):  Fuctional Use of Upper Extremity OT Plan OT Intensity: Minimum of 1-2 x/day, 45  to 90 minutes OT Frequency: 5 out of 7 days OT Duration/Estimated Length of Stay: 10-14 OT Treatment/Interventions: Balance/vestibular training;Community reintegration;Discharge planning;DME/adaptive equipment instruction;Disease mangement/prevention;Functional mobility training;Neuromuscular re-education;Patient/family education;Psychosocial support;Self Care/advanced ADL retraining;Therapeutic Activities;Therapeutic Exercise;UE/LE Strength taining/ROM;UE/LE Coordination activities OT Self Feeding Anticipated Outcome(s): Mod I OT Basic Self-Care Anticipated Outcome(s): Mod I OT Toileting Anticipated Outcome(s): Mod I  OT Bathroom Transfers Anticipated Outcome(s): Mod I OT Recommendation Patient destination: Home Follow Up Recommendations: 24 hour supervision/assistance;Outpatient OT Equipment Recommended: To be determined   Skilled Therapeutic Intervention Pt greeted seated EOB with daughter present. OT session focused on neuromuscular re-education for RUE and RLE during session. Emphasized importance of using R UE and encouraged normal movement patterns. Pt required Min A for stand-pivot transfers. Pt completed shower transfer, then bathing and dressing tasks with Min-Mod A and Mod verbal cues to slow down and facilitate normal movement patterns and weight shifting on R side. Pt left seated in w/c at end of session with daughter present.   OT Evaluation Precautions/Restrictions  Precautions Precautions: Fall Restrictions Weight Bearing Restrictions: No  Pain Pain Assessment Pain Assessment: No/denies pain Pain Score: 0-No pain Home Living/Prior Functioning Home Living Family/patient expects to be discharged to:: Private residence Living Arrangements: Spouse/significant other, Children ([place]-daughter age 68) Available Help at Discharge: Family, Friend(s), Available PRN/intermittently Type of Home:  House Home Access: Stairs to enter Technical brewer of Steps: 3 Entrance Stairs-Rails: Right Home Layout: Two level Alternate Level Stairs-Number of Steps: flight Alternate Level Stairs-Rails: Left (switches to R after platform) Bathroom Shower/Tub: Walk-in shower Home Equipment: None Additional Comments: has x2 pug dogs in the home that are wife's.   Lives With: Family Prior Function Level of Independence: Independent with basic ADLs Vocation: Full time employment Vocation Requirements: Land, travels and carries heavy tools ADL   Vision/Perception  Vision- History Baseline Vision/History: Wears glasses Wears Glasses: At all times Patient Visual Report: No change from baseline Vision- Assessment Vision Assessment?: No apparent visual deficits  Cognition Overall Cognitive Status: Within Functional Limits for tasks assessed Arousal/Alertness: Awake/alert Orientation Level: Person;Place;Situation Person: Oriented Place: Oriented Situation: Oriented Year: 2017 Month: August Day of Week: Correct Memory: Appears intact Immediate Memory Recall: Sock;Blue;Bed Memory Recall: Bed;Blue;Sock Memory Recall Sock: With Cue Memory Recall Blue: Without Cue Memory Recall Bed: With Cue Attention: Divided Divided Attention: Appears intact Awareness: Appears intact Problem Solving: Appears intact Executive Function: Reasoning Reasoning: Appears intact Behaviors: Impulsive Safety/Judgment: Appears intact (slightly impaired; likely personality) Comments: slightly impulsive- likely personality Sensation Sensation Light Touch: Appears Intact Proprioception: Appears Intact Coordination Gross Motor Movements are Fluid and Coordinated: No Fine Motor Movements are Fluid and Coordinated: No Coordination and Movement Description: LLE decreased coordination.  Motor  Motor Motor: Hemiplegia;Abnormal tone Motor - Skilled Clinical Observations: R sided hemiplegia with  decreased tone in R ankle.  Mobility  Bed Mobility Bed Mobility: Rolling Right;Rolling Left;Supine to Sit;Sit to Supine Rolling Right: 5: Supervision Rolling Left: 5: Supervision Supine to Sit: 4: Min assist Supine to Sit Details: Verbal cues for precautions/safety;Verbal cues for technique Sit to Supine: 4: Min assist Sit to Supine - Details: Verbal cues for technique;Verbal cues for precautions/safety Transfers Sit to Stand: 4: Min assist Sit to Stand Details: Verbal cues for technique;Verbal cues for precautions/safety Stand to Sit: 4: Min assist Stand to Sit Details (indicate cue type and reason): Verbal cues for technique;Verbal cues for precautions/safety  Trunk/Postural Assessment  Cervical Assessment Cervical Assessment: Within Functional Limits Thoracic Assessment Thoracic Assessment: Within Functional Limits Lumbar Assessment Lumbar Assessment: Exceptions  to [facility] (posterior pelvic tilt. ) Postural Control Postural Control: Deficits on evaluation  Balance Balance Balance Assessed: Yes Static Sitting Balance Static Sitting - Level of Assistance: 6: Modified independent (Device/Increase time) Dynamic Sitting Balance Dynamic Sitting - Level of Assistance: 5: Stand by assistance Static Standing Balance Static Standing - Level of Assistance: 4: Min assist Dynamic Standing Balance Dynamic Standing - Level of Assistance: 3: Mod assist Extremity/Trunk Assessment RUE Assessment RUE Assessment: Exceptions to [facility] RUE AROM (degrees) RUE Overall AROM Comments: limited shoulder, elbow, wrist, hand AROM Right Shoulder Flexion: 80 Degrees (0-80) Right Elbow Flexion: 30 (0-30) Right Forearm Supination: 10 Degrees Right Wrist Extension: 10 Degrees RUE Strength RUE Overall Strength: Deficits RUE Overall Strength Comments: diminished distal strength Right Shoulder Flexion: 2+/5 Right Elbow Flexion: 2/5 Right Forearm Supination: 2/5 Right Wrist Extension: 2/5 Right Hand Gross  Grasp: Impaired RUE Tone RUE Tone: Within Functional Limits;Other (Comment) RUE Tone Comments: Brunnstrom stage IV-V - semivoluntary finger extension, awkward cylindrcal grasp,  LUE Assessment LUE Assessment: Within Functional Limits  See Function Navigator for Current Functional Status.   Refer to Care Plan for Long Term Goals  Recommendations for other services: None  Discharge Criteria: Patient will be discharged from OT if patient refuses treatment 3 consecutive times without medical reason, if treatment goals not met, if there is a change in medical status, if patient makes no progress towards goals or if patient is discharged from hospital.  The above assessment, treatment plan, treatment alternatives and goals were discussed and mutually agreed upon: by patient  Valma Cava 01/21/2016, 3:53 PM

## 2016-01-21 NOTE — H&P (View-Only) (Signed)
Physical Medicine and Rehabilitation Admission H&P       Chief Complaint  Patient presents with  . Weakness  : HPI: [name] Fischetti Jr.is a 50 y.o.right handed malewith history of hypertension, tobacco abuse, Chronic renal insufficiency with baseline creatinine 1.3-1.4 and hyperlipidemia. Per chart review patient lives with spouse in independentprior to admission ambulating without assistive device. 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 Presented back 01/17/2016 with increasing right sided weakness as well as right facial droopand slurred speech. Patient did not receive TPA. MRI of the brain did not show any new areas of infarct suspect evolution of prior pontine infarct. MRA of head and angiogram of neck showed no significant carotid stenosis in the neck. 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 Neurology follow-up suspect evolution of left pontine infarct. Patient remains on aspirin therapy. Subcutaneous Lovenox added for DVT prophylaxis. Newly diagnosed diabetes mellitus with hemoglobin A1c 8.5 obtained obtained on sliding scale insulin. Physical therapy evaluation completed 01/18/2016 and patient now requiring moderate assist for ambulation. Request made for physical medicine rehabilitation consult.Patient was admitted for a comprehensive rehabilitation program  Patient denies any swallowing problems. He still has some slurring of speech. According to the wife. Starting to move the right upper extremity better. Still no motion at the right foot and ankle area. Discussed usual pattern of recovery, status post stroke  ROS Constitutional: Negative for chillsand fever.  HENT: Negative for hearing loss.   Eyes: Negative for blurred visionand double vision.  Respiratory: Negative for coughand shortness of breath.  Cardiovascular: Negative for chest pain, palpitationsand leg swelling.  Gastrointestinal: Positive for constipation. Negative for nauseaand vomiting.  Genitourinary: Negative for dysuriaand hematuria.  Musculoskeletal: Positive for myalgias.  Skin: Negative for rash.  Neurological: Positive for speech change, focal weaknessand weakness. Negative for sensory change, seizures, loss of consciousnessand headaches.  All other systems reviewed and are negative   Past Medical History:  Diagnosis Date  . Allergy   . Hernia, umbilical   . Hyperlipidemia   . Hypertension         Past Surgical History:  Procedure Laterality Date  . UVULOPALATOPHARYNGOPLASTY    . VARICOSE VEIN SURGERY          Family History  Problem Relation Age of Onset  . Heart attack Father    Social History:  reports that he has been smoking.  He has been smoking about 2.00 packs per day. He has never used smokeless tobacco. He reports that he drinks alcohol. He reports that he does not use drugs. Allergies:      Allergies  Allergen Reactions  . Morphine And Related Itching         Medications Prior to Admission  Medication Sig Dispense Refill  . aspirin 325 MG tablet Take 1 tablet (325 mg total) by mouth daily. 30 tablet 0  . atorvastatin (LIPITOR) 20 MG tablet Take 1 tablet (20 mg total) by mouth daily at 6 PM. 30 tablet 0  . lisinopril-hydrochlorothiazide (ZESTORETIC) 20-12.5 MG per tablet Take 1 tablet by mouth daily. 90 tablet 3  . fluticasone (FLONASE) 50 MCG/ACT nasal spray Place 1-2 sprays into both nostrils daily as needed for rhinitis.       Home: Home Living Family/patient expects to be discharged to:: Private   residence Living Arrangements: Spouse/significant other Available Help at Discharge: Family, Friend(s), Available PRN/intermittently Type of Home:  House Home Access: Stairs to enter Technical brewer of Steps: 3 Entrance Stairs-Rails: Right Home Layout: Two level Alternate Level Stairs-Number of Steps: flight Alternate Level Stairs-Rails: Left (then changes to R) Home Equipment: None Additional Comments: has x2 pug dogs in the home that are wife's.   Lives With: Family   Functional History: Prior Function Level of Independence: Independent Comments: travels for work, pt did have R hand residual deficits/difficulty writing as he is R handed from TIA over the weekend  Functional Status:  Mobility: Bed Mobility General bed mobility comments: sitting in chair with chair alarm Transfers Overall transfer level: Needs assistance Equipment used: None Transfers: Sit to/from Stand Sit to Stand: Min guard General transfer comment: unable to use R UE functionally, used momentum to bring self up into standing, impulsively quick, leaning more to the L due to R sided weakness Ambulation/Gait Ambulation/Gait assistance: Mod assist Ambulation Distance (Feet): 75 Feet (x2, 1 trial without ace wrap and 1 trial with wrap) Assistive device: 1 person hand held assist Gait Pattern/deviations: Step-through pattern, Decreased dorsiflexion - right, Decreased stride length, Narrow base of support General Gait Details: Pt required mod hand held A to maintain balance due to increased weight shift to L for clearance of R foot. Ace wrap to assist with R DF. Verbal ques to focus on R foot placement and to decrease excessive extension of R knee.    Gait velocity interpretation: Below normal speed for age/gender    ADL: ADL Overall ADL's : Needs assistance/impaired Grooming: Wash/dry hands, Min guard, Standing Toilet Transfer: Min guard, Ambulation, Regular Glass blower/designer Details (indicate cue type and reason): needed cues to safety and R LE positioning. pt with hyperextension at the knee.  Functional mobility during ADLs: Min  guard General ADL Comments: pt grabbing to door frames and reaching for other environmental supports at this time. pt with poor recall of R foot drop. pt when cues states "they put something on it before" Pt when completing sit<.stand lateral leans Lt and powers up with L side of body. pt provided exercises for UB and LB. pt educated on knee extension, knee flexion, glut squeeze, wrist flexion/ extension, digit flexion/ extension, elbow flexion , extension at this time.   Cognition: Cognition Overall Cognitive Status: Impaired/Different from baseline Orientation Level: Oriented X4 Cognition Arousal/Alertness: Awake/alert Behavior During Therapy: Impulsive Overall Cognitive Status: Impaired/Different from baseline Area of Impairment: Safety/judgement, Awareness Safety/Judgement: Decreased awareness of safety, Decreased awareness of deficits Awareness: Anticipatory General Comments: Pt reports "that he is fine" but when asked further defines changes to his R side. pt reports no need for help to the bathroom with lack of awareness to R foot drop and decr ability to power up with R side. Knocking object off sink surface without sensation to object and only hearing the object land and wife state "what did you knock off ? oh it was only my phone and cards and money" with half joking tone. Pt attempting to provide reasoning to all education and how only deficit is his speech with poor awareness to other deficits.   Physical Exam: Blood pressure (!) 149/80, pulse 79, temperature 98.2 F (36.8 C), temperature source Oral, resp. rate 20, height 5' 10" (1.778 m), weight 117.9 kg (260 lb), SpO2 98 %. Physical Exam Constitutional: He is oriented to person, place, and time. He appears well-developedand well-nourished.  HENT:  Head: Normocephalicand atraumatic.  Eyes: Conjunctivaeand EOMare normal.  Neck: Normal range of motion. Neck supple. No thyromegalypresent.  Cardiovascular: Normal rateand  regular rhythm.  Respiratory: Effort normaland breath sounds normal. No respiratory distress.  GI: Soft. Bowel sounds are normal. He exhibits no distension.  Musculoskeletal: He exhibits no edemaor tenderness.  Neurological: He is alertand oriented to person, place, and time.  Dysarthria speech but intelligible.  Good awareness of deficits Right hemifacial wekaness Sensation intact to light touch DTRs symmetric Motor: RUE: 2/5 RLE: Hip flexion 2+/5, knee extension 4/5, ankle dorsi/plantar flexion 0/5 LUE/LLE: 5/5 proximal to distal Skin: Skin is warmand dry.  Psychiatric: He has a normal mood and affect. His behavior is normal. Thought contentnormal   Lab Results Last 48 Hours        Results for orders placed or performed during the hospital encounter of 01/17/16 (from the past 48 hour(s))  Protime-INR     Status: None   Collection Time: 01/17/16 10:23 AM  Result Value Ref Range   Prothrombin Time 12.4 11.4 - 15.2 seconds   INR 0.92   APTT     Status: None   Collection Time: 01/17/16 10:23 AM  Result Value Ref Range   aPTT 25 24 - 36 seconds  CBC with Differential/Platelet     Status: None   Collection Time: 01/17/16 10:24 AM  Result Value Ref Range   WBC 9.1 4.0 - 10.5 K/uL   RBC 4.85 4.22 - 5.81 MIL/uL   Hemoglobin 15.3 13.0 - 17.0 g/dL   HCT 46.1 39.0 - 52.0 %   MCV 95.1 78.0 - 100.0 fL   MCH 31.5 26.0 - 34.0 pg   MCHC 33.2 30.0 - 36.0 g/dL   RDW 13.4 11.5 - 15.5 %   Platelets 154 150 - 400 K/uL   Neutrophils Relative % 73 %   Neutro Abs 6.6 1.7 - 7.7 K/uL   Lymphocytes Relative 18 %   Lymphs Abs 1.6 0.7 - 4.0 K/uL   Monocytes Relative 6 %   Monocytes Absolute 0.6 0.1 - 1.0 K/uL   Eosinophils Relative 3 %   Eosinophils Absolute 0.2 0.0 - 0.7 K/uL   Basophils Relative 0 %   Basophils Absolute 0.0 0.0 - 0.1 K/uL  Comprehensive metabolic panel     Status: Abnormal   Collection Time: 01/17/16 10:24 AM  Result Value Ref Range    Sodium 137 135 - 145 mmol/L   Potassium 3.6 3.5 - 5.1 mmol/L   Chloride 105 101 - 111 mmol/L   CO2 24 22 - 32 mmol/L   Glucose, Bld 177 (H) 65 - 99 mg/dL   BUN 17 6 - 20 mg/dL   Creatinine, Ser 0.77 0.61 - 1.24 mg/dL   Calcium 8.7 (L) 8.9 - 10.3 mg/dL   Total Protein 6.2 (L) 6.5 - 8.1 g/dL   Albumin 3.4 (L) 3.5 - 5.0 g/dL   AST 25 15 - 41 U/L   ALT 26 17 - 63 U/L   Alkaline Phosphatase 55 38 - 126 U/L   Total Bilirubin 0.8 0.3 - 1.2 mg/dL   GFR calc non Af Amer >60 >60 mL/min   GFR calc Af Amer >60 >60 mL/min    Comment: (NOTE) The eGFR has been calculated using the CKD EPI equation. This calculation has not been validated in all clinical situations. eGFR's persistently <60 mL/min signify possible Chronic Kidney Disease.   Anion gap 8 5 - 15  Glucose, capillary     Status: Abnormal   Collection Time:  01/17/16  5:31 PM  Result Value Ref Range   Glucose-Capillary 215 (H) 65 - 99 mg/dL   Comment 1 Notify RN    Comment 2 Document in Chart   Glucose, capillary     Status: Abnormal   Collection Time: 01/17/16 10:47 PM  Result Value Ref Range   Glucose-Capillary 103 (H) 65 - 99 mg/dL   Comment 1 Notify RN    Comment 2 Document in Chart   Glucose, capillary     Status: Abnormal   Collection Time: 01/18/16  6:31 AM  Result Value Ref Range   Glucose-Capillary 167 (H) 65 - 99 mg/dL   Comment 1 Notify RN    Comment 2 Document in Chart   Glucose, capillary     Status: Abnormal   Collection Time: 01/18/16 11:15 AM  Result Value Ref Range   Glucose-Capillary 149 (H) 65 - 99 mg/dL  Glucose, capillary     Status: Abnormal   Collection Time: 01/18/16  4:18 PM  Result Value Ref Range   Glucose-Capillary 266 (H) 65 - 99 mg/dL  Glucose, capillary     Status: Abnormal   Collection Time: 01/19/16  6:01 AM  Result Value Ref Range   Glucose-Capillary 135 (H) 65 - 99 mg/dL   Comment 1 Notify RN    Comment 2 Document in Chart       Imaging  Results (Last 48 hours)  Dg Chest 2 View  Result Date: 01/17/2016 CLINICAL DATA:  Patient admitted with a stroke on 01/14/2016. Inability to move the right arm and severe right leg weakness today. Slurred speech. EXAM: CHEST  2 VIEW COMPARISON:  None. FINDINGS: The lungs are clear. Heart size is upper normal. No pneumothorax or pleural effusion. No focal bony abnormality. IMPRESSION: No acute disease. Electronically Signed   By: Inge Rise M.D.   On: 01/17/2016 16:35   Ct Head Wo Contrast  Result Date: 01/17/2016 CLINICAL DATA:  50 year old male with worsening right arm weakness and right facial droop since yesterday. Recent stroke. Subsequent encounter. EXAM: CT HEAD WITHOUT CONTRAST TECHNIQUE: Contiguous axial images were obtained from the base of the skull through the vertex without intravenous contrast. COMPARISON:  01/15/2016 MR. 01/14/2016 CT angiogram head and neck and CT. FINDINGS: Brain: Acute left lower pontine infarct may have progressed since prior exam and is more conspicuous on the present CT. Chronic microvascular CT moderate chronic microvascular changes. No intracranial hemorrhage. Prominent basal ganglia calcifications/mineralization unchanged. No hydrocephalus. No intracranial mass lesion noted on this unenhanced exam. Vascular: No hyperdense major vessel. Skull: No acute abnormality. Sinuses/Orbits: Moderate opacification right maxillary sinus. Minimal mucosal thickening left maxillary sinus. Partial opacification ethmoid sinus air cells bilaterally. Other: Negative. IMPRESSION: Acute left lower pontine infarct may have progressed since prior exam and is more conspicuous on the present CT. Moderate microvascular changes. No intracranial hemorrhage. Moderate opacification right maxillary sinus. Minimal mucosal thickening left maxillary sinus. Partial opacification ethmoid sinus air cells bilaterally. Electronically Signed   By: Genia Del M.D.   On: 01/17/2016 10:36   Mr  Angiogram Neck W Wo Contrast  Result Date: 01/18/2016 CLINICAL DATA:  Recent left pontine stroke with worsening symptoms of right-sided weakness. EXAM: MR HEAD WITHOUT CONTRAST MR CIRCLE OF WILLIS WITHOUT CONTRAST MRA OF THE NECK WITHOUT AND WITH CONTRAST TECHNIQUE: Multiplanar, multiecho pulse sequences of the brain and surrounding structures were obtained according to standard protocol without intravenous contrast.; Multiplanar and multiecho pulse sequences of the neck were obtained without and with intravenous contrast.  Angiographic images of the neck were obtained using MRA technique without and with intravenous contast.; Angiographic images of the Circle of Willis were obtained using MRA technique without intravenous contrast. CONTRAST:  20mL MULTIHANCE GADOBENATE DIMEGLUMINE 529 MG/ML IV SOLN COMPARISON:  MRI 01/15/2016.  CTA 01/14/2016 FINDINGS: MR HEAD FINDINGS Restricted diffusion in the left lower ventral pons is unchanged in size from the recent MRI. No new area of acute infarct since the prior study. Generalized atrophy. Moderate chronic microvascular ischemic change throughout the white matter. Chronic lacunar infarction left middle cerebellar peduncle unchanged. Negative for hemorrhage or mass.  No shift of the midline structures Mucosal edema throughout the paranasal sinuses right greater than left unchanged. Pituitary not enlarged. Normal orbital structures. MR CIRCLE OF WILLIS FINDINGS Image quality degraded by mild motion. There is misregistration in the mid basilar and internal carotid artery due to patient motion Both vertebral arteries patent to the basilar with mild stenosis distally in both vertebral arteries. Basilar widely patent. Superior cerebellar and posterior cerebral arteries patent. Fetal origin left posterior cerebral artery. Left PICA patent. Right PICA not visualized. Internal carotid artery patent bilaterally without stenosis. Anterior and middle cerebral arteries patent  bilaterally without stenosis or branch occlusion. Negative for cerebral vascular malformation. MRA NECK FINDINGS Carotid bifurcation widely patent bilaterally without significant stenosis. Mild atherosclerotic disease in the left carotid bulb. Both vertebral arteries patent to the basilar. Moderate stenosis at the origin of the vertebral artery bilaterally. Mild stenosis distal vertebral artery bilaterally. IMPRESSION: Restricted diffusion left pons unchanged from the recent MRI compatible with subacute infarct. No new area of infarct since the prior MRI of 01/14/2014. Moderate chronic microvascular ischemic change. 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 Basilar is patent. Electronically Signed   By: Charles  Clark M.D.   On: 01/18/2016 11:15   Mr Brain Wo Contrast  Result Date: 01/18/2016 CLINICAL DATA:  Recent left pontine stroke with worsening symptoms of right-sided weakness. EXAM: MR HEAD WITHOUT CONTRAST MR CIRCLE OF WILLIS WITHOUT CONTRAST MRA OF THE NECK WITHOUT AND WITH CONTRAST TECHNIQUE: Multiplanar, multiecho pulse sequences of the brain and surrounding structures were obtained according to standard protocol without intravenous contrast.; Multiplanar and multiecho pulse sequences of the neck were obtained without and with intravenous contrast. Angiographic images of the neck were obtained using MRA technique without and with intravenous contast.; Angiographic images of the Circle of Willis were obtained using MRA technique without intravenous contrast. CONTRAST:  20mL MULTIHANCE GADOBENATE DIMEGLUMINE 529 MG/ML IV SOLN COMPARISON:  MRI 01/15/2016.  CTA 01/14/2016 FINDINGS: MR HEAD FINDINGS Restricted diffusion in the left lower ventral pons is unchanged in size from the recent MRI. No new area of acute infarct since the prior study. Generalized atrophy. Moderate chronic  microvascular ischemic change throughout the white matter. Chronic lacunar infarction left middle cerebellar peduncle unchanged. Negative for hemorrhage or mass.  No shift of the midline structures Mucosal edema throughout the paranasal sinuses right greater than left unchanged. Pituitary not enlarged. Normal orbital structures. MR CIRCLE OF WILLIS FINDINGS Image quality degraded by mild motion. There is misregistration in the mid basilar and internal carotid artery due to patient motion Both vertebral arteries patent to the basilar with mild stenosis distally in both vertebral arteries. Basilar widely patent. Superior cerebellar and posterior cerebral arteries patent. Fetal origin left posterior cerebral artery. Left PICA patent. Right PICA not visualized. Internal carotid artery patent bilaterally   without stenosis. Anterior and middle cerebral arteries patent bilaterally without stenosis or branch occlusion. Negative for cerebral vascular malformation. MRA NECK FINDINGS Carotid bifurcation widely patent bilaterally without significant stenosis. Mild atherosclerotic disease in the left carotid bulb. Both vertebral arteries patent to the basilar. Moderate stenosis at the origin of the vertebral artery bilaterally. Mild stenosis distal vertebral artery bilaterally. IMPRESSION: Restricted diffusion left pons unchanged from the recent MRI compatible with subacute infarct. No new area of infarct since the prior MRI of 01/14/2014. Moderate chronic microvascular ischemic change. 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 Basilar is patent. Electronically Signed   By: Franchot Gallo M.D.   On: 01/18/2016 11:15   Mr Jodene Nam Head/brain UT Cm  Result Date: 01/18/2016 CLINICAL DATA:  Recent left pontine stroke with worsening symptoms of right-sided weakness. EXAM: MR HEAD WITHOUT CONTRAST MR CIRCLE OF  WILLIS WITHOUT CONTRAST MRA OF THE NECK WITHOUT AND WITH CONTRAST TECHNIQUE: Multiplanar, multiecho pulse sequences of the brain and surrounding structures were obtained according to standard protocol without intravenous contrast.; Multiplanar and multiecho pulse sequences of the neck were obtained without and with intravenous contrast. Angiographic images of the neck were obtained using MRA technique without and with intravenous contast.; Angiographic images of the Circle of Willis were obtained using MRA technique without intravenous contrast. CONTRAST:  62m MULTIHANCE GADOBENATE DIMEGLUMINE 529 MG/ML IV SOLN COMPARISON:  MRI 01/15/2016.  CTA 01/14/2016 FINDINGS: MR HEAD FINDINGS Restricted diffusion in the left lower ventral pons is unchanged in size from the recent MRI. No new area of acute infarct since the prior study. Generalized atrophy. Moderate chronic microvascular ischemic change throughout the white matter. Chronic lacunar infarction left middle cerebellar peduncle unchanged. Negative for hemorrhage or mass.  No shift of the midline structures Mucosal edema throughout the paranasal sinuses right greater than left unchanged. Pituitary not enlarged. Normal orbital structures. MR CIRCLE OF WILLIS FINDINGS Image quality degraded by mild motion. There is misregistration in the mid basilar and internal carotid artery due to patient motion Both vertebral arteries patent to the basilar with mild stenosis distally in both vertebral arteries. Basilar widely patent. Superior cerebellar and posterior cerebral arteries patent. Fetal origin left posterior cerebral artery. Left PICA patent. Right PICA not visualized. Internal carotid artery patent bilaterally without stenosis. Anterior and middle cerebral arteries patent bilaterally without stenosis or branch occlusion. Negative for cerebral vascular malformation. MRA NECK FINDINGS Carotid bifurcation widely patent bilaterally without significant stenosis. Mild  atherosclerotic disease in the left carotid bulb. Both vertebral arteries patent to the basilar. Moderate stenosis at the origin of the vertebral artery bilaterally. Mild stenosis distal vertebral artery bilaterally. IMPRESSION: Restricted diffusion left pons unchanged from the recent MRI compatible with subacute infarct. No new area of infarct since the prior MRI of 01/14/2014. Moderate chronic microvascular ischemic change. 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 Basilar is patent. Electronically Signed   By: CFranchot GalloM.D.   On: 01/18/2016 11:15        Medical Problem List and Plan: 1.  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  DVT Prophylaxis/Anticoagulation: Subcutaneous Lovenox. Monitor platelet counts of any signs of bleeding 3. Pain Management: Tylenol as needed 4. Hypertension. Lisinopril 20 mg daily, HCTZ 12.5 mg daily.  Monitor with increased mobility 5. Neuropsych: This patient is capable of making decisions on his own behalf. 6. Skin/Wound Care: Routine skin checks 7. Fluids/Electrolytes/Nutrition: Routine I&O's with follow-up chemistries 8. Newly diagnosed diabetes mellitus. Hemoglobin A1c 8.5. Check blood sugars before meals and at bedtime. Provide diabetic teaching. 9. Chronic renal insufficiency. Baseline creatinine 1.3-1.4. Follow-up chemistries 10. Hyperlipidemia. Lipitor 11. Tobacco abuse. Nicoderm patch. Provide counseling   Post Admission Physician Evaluation: 1. Functional deficits secondary  to right hemiplegia. 2. Patient is admitted to receive collaborative, interdisciplinary care between the physiatrist, rehab nursing staff, and therapy team. 3. Patient's level of medical complexity and substantial therapy needs in context of that medical necessity cannot  be provided at a lesser intensity of care such as a SNF. 4. Patient has experienced substantial functional loss from his/her baseline which was documented above under the "Functional History" and "Functional Status" headings.  Judging by the patient's diagnosis, physical exam, and functional history, the patient has potential for functional progress which will result in measurable gains while on inpatient rehab.  These gains will be of substantial and practical use upon discharge  in facilitating mobility and self-care at the household level. 5. Physiatrist will provide 24 hour management of medical needs as well as oversight of the therapy plan/treatment and provide guidance as appropriate regarding the interaction of the two. 6. 24 hour rehab nursing will assist with bladder management, bowel management, safety, skin/wound care, disease management, medication administration, pain management and patient education  and help integrate therapy concepts, techniques,education, etc. 7. PT will assess and treat for/with: pre gait, gait training, endurance , safety, equipment, neuromuscular re education.   Goals are: Mod I. 8. OT will assess and treat for/with: ADLs, Cognitive perceptual skills, Neuromuscular re education, safety, endurance, equipment.   Goals are: ModI. Therapy may proceed with showering this patient. 9. SLP will assess and treat for/with: dysarthria.  Goals are: 100% speech intelligiblity. 10. Case Management and Social Worker will assess and treat for psychological issues and discharge planning. 11. Team conference will be held weekly to assess progress toward goals and to determine barriers to discharge. 12. Patient will receive at least 3 hours of therapy per day at least 5 days per week. 13. ELOS: 10-14d       14. Prognosis:  excellent     [name] E. Kirsteins M.D. [facility] FAAPM&R (Sports Med, Neuromuscular Med) Diplomate Am Board of Electrodiagnostic  Med  01/20/2016  

## 2016-01-21 NOTE — Evaluation (Signed)
Physical Therapy Assessment and Plan  Patient Details  Name: [name] [name]. MRN: [id number] Date of Birth: 03/16/1966  PT Diagnosis: Abnormality of gait, Coordination disorder, Hemiplegia dominant and Muscle weakness Rehab Potential: Excellent ELOS: 10-14 days.    Today's Date: 01/21/2016 PT Individual Time: 4098-1191 PT Individual Time Calculation (min): 68 min     Problem List: Patient Active Problem List   Diagnosis Date Noted  . 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 Benign essential HTN   . Cerebral thrombosis with cerebral infarction 01/17/2016  . Type 2 diabetes mellitus with hyperglycemia, without long-term current use of insulin ([place]) 01/17/2016  . Stroke ([place]) 01/17/2016  . Cerebral infarction due to unspecified mechanism 01/15/2016  . Cerebrovascular accident (CVA) due to thrombosis of basilar artery ([place])   . HLD (hyperlipidemia)   . Tobacco use disorder   . Stroke-like symptoms 01/14/2016  . Acute right-sided weakness 01/14/2016  . AKI (acute kidney injury) ([place]) 01/14/2016  . Accelerated hypertension 01/14/2016  . Right hemiparesis ([place]) 01/14/2016    Past Medical History:  Past Medical History:  Diagnosis Date  . Allergy   . Hernia, umbilical   . Hyperlipidemia   . Hypertension    Past Surgical History:  Past Surgical History:  Procedure Laterality Date  . UVULOPALATOPHARYNGOPLASTY    . VARICOSE VEIN SURGERY      Assessment & Plan Clinical Impression: Patient is a 50 y.o.right handed malewith history of hypertension, tobacco abuse, Chronic renal insufficiency with baseline creatinine 1.3-1.4and hyperlipidemia. Per chart review patient lives with spouse in independentprior to admission ambulating without assistive device. 2 level home with 3 steps to entryand bedroom upstairs.Wife works during the day.Patient recently admitted to the hospital with a small left pontine infarct 01/15/2016 discharged to home 01/16/2016 at supervision level  maintained on aspirin. Presented back 01/17/2016 with increasing right sided weakness as well as right facial droopand slurred speech. Patient did not receive TPA. MRI of the brain did not show any new areas of infarct suspect evolution of prior pontine infarct. MRA of head and angiogram of neck showed no significant carotid stenosis in the neck. 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 Neurology follow-up suspect evolution of left pontine infarct. Patient remains on aspirin therapy. SubcutaneousLovenox added for DVT prophylaxis.Newly diagnosed diabetes mellitus with hemoglobin A1c 8.5 obtained obtained on sliding scale insulin. Patient transferred to CIR on 01/20/2016 .   Patient currently requires mod with mobility secondary to muscle weakness, impaired timing and sequencing, abnormal tone and unbalanced muscle activation and decreased sitting balance, decreased standing balance, decreased postural control, hemiplegia and decreased balance strategies.  Prior to hospitalization, patient was independent  with mobility and lived with Family in a House home.  Home access is 3Stairs to enter.  Patient will benefit from skilled PT intervention to maximize safe functional mobility, minimize fall risk and decrease caregiver burden for planned discharge home with intermittent assist.  Anticipate patient will benefit from follow up OP at discharge.  PT - End of Session Activity Tolerance: Endurance does not limit participation in activity Endurance Deficit: No PT Assessment Rehab Potential (ACUTE/IP ONLY): Excellent Barriers to Discharge: Inaccessible home environment PT Patient demonstrates impairments in the following area(s): Balance;Behavior;Motor;Perception;Safety PT Transfers Functional Problem(s): Bed Mobility;Bed to Chair;Car;Furniture;Floor PT Locomotion Functional Problem(s): Ambulation;Wheelchair Mobility;Stairs PT  Plan PT Intensity: Minimum of 1-2 x/day ,45 to 90 minutes PT Frequency: 5 out of 7 days PT Duration Estimated Length of Stay:  10-14 days.  PT Treatment/Interventions: Ambulation/gait training;Balance/vestibular training;Cognitive remediation/compensation;Community reintegration;Discharge planning;Disease management/prevention;DME/adaptive equipment instruction;Functional electrical stimulation;Functional mobility training;Neuromuscular re-education;Pain management;Patient/family education;Psychosocial support;Skin care/wound management;Stair training;Therapeutic Exercise;Therapeutic Activities;Splinting/orthotics;UE/LE Strength taining/ROM;UE/LE Coordination activities;Visual/perceptual remediation/compensation;Wheelchair propulsion/positioning PT Transfers Anticipated Outcome(s): mod I with LRAD.  PT Locomotion Anticipated Outcome(s): Supervision-Mod I with LRAD PT Recommendation Follow Up Recommendations: out patient PT Patient destination: Home Equipment Recommended: Cane;Quad cane;To be determinedzd  Skilled Therapeutic Intervention   Patient received sitting in [facility] and agreeable to PT. PT instructed patient in evaluation and initiated treatment intervention; see below for results. PT educated patient on PT treatment plan and rehab goals, as well as anticipated outcomes. Patient instructed in gait training with RW and without RW. Overall patient required min A with constant cues for improved foor clearance, but noted to require mod A to prevent fall with both RW and without AD. PT instructed patient in car transfer with min A for safety and cues for improved positioning and technique due to impulsiveness.   Patient returned to room and left sitting in [facility] with call bell in reach and daughter present.   PT Evaluation Precautions/Restrictions Precautions Precautions: Fall Restrictions Weight Bearing Restrictions: No General   Vital Signs Pain Pain Assessment Pain Assessment: No/denies  pain Pain Score: 0-No pain Home Living/Prior Functioning Home Living Living Arrangements: Spouse/significant other;Children (Chelsea-daughter age 73) Available Help at Discharge: Family;Friend(s);Available PRN/intermittently Type of Home: House Home Access: Stairs to enter [employer] of Steps: 3 Entrance Stairs-Rails: Right Home Layout: Two level Alternate Level Stairs-Number of Steps: flight Alternate Level Stairs-Rails: Left (switches to R after platform) Home Equipment: None Prior Function Level of Independence: Independent with basic ADLs Vocation: Full time employment Vocation Requirements: Land, travels and carries heavy tools Vision/Perception     Cognition Overall Cognitive Status: Within Functional Limits for tasks assessed Arousal/Alertness: Awake/alert Attention: Divided Divided Attention: Appears intact Memory: Appears intact Awareness: Appears intact Problem Solving: Appears intact Executive Function: Reasoning Reasoning: Appears intact Behaviors: Impulsive Safety/Judgment: Appears intact (slightly impaired; likely personality) Comments: slightly impulsive- likely personality Sensation Sensation Light Touch: Appears Intact Proprioception: Appears Intact Coordination Gross Motor Movements are Fluid and Coordinated: No Fine Motor Movements are Fluid and Coordinated: No Coordination and Movement Description: LLE decreased coordination.  Motor  Motor Motor: Hemiplegia;Abnormal tone Motor - Skilled Clinical Observations: R sided hemiplegia with decreased tone in R ankle.   Mobility Bed Mobility Bed Mobility: Rolling Right;Rolling Left;Supine to Sit;Sit to Supine Rolling Right: 5: Supervision Rolling Left: 5: Supervision Supine to Sit: 4: Min assist Supine to Sit Details: Verbal cues for precautions/safety;Verbal cues for technique Sit to Supine: 4: Min assist Sit to Supine - Details: Verbal cues for technique;Verbal cues for  precautions/safety Transfers Transfers: Yes Sit to Stand: 4: Min assist Sit to Stand Details: Verbal cues for technique;Verbal cues for precautions/safety Stand to Sit: 4: Min assist Stand to Sit Details (indicate cue type and reason): Verbal cues for technique;Verbal cues for precautions/safety Stand Pivot Transfers: 4: Min assist Stand Pivot Transfer Details: Verbal cues for technique;Verbal cues for precautions/safety Locomotion  Ambulation Ambulation Distance (Feet): 160 Feet Gait Gait: Yes Gait Pattern: Step-through pattern;Decreased step length - right;Right foot flat;Right circumduction;Right hip hike;Poor foot clearance - right;Decreased trunk rotation Stairs / Additional Locomotion Stairs: Yes Stairs Assistance: 3: Mod assist Stair Management Technique: Two rails Number of Stairs: 12 Height of Stairs: 6 (4("6), 8(3") ) Wheelchair Mobility Wheelchair Mobility: Yes Wheelchair Assistance: 5: Supervision Wheelchair Assistance Details: Verbal cues for technique;Verbal cues for precautions/safety Wheelchair Propulsion: Left lower  extremity Wheelchair Parts Management: Needs assistance Distance: 140f  Trunk/Postural Assessment  Cervical Assessment Cervical Assessment: Within Functional Limits Thoracic Assessment Thoracic Assessment: Within Functional Limits Lumbar Assessment Lumbar Assessment: Exceptions to [facility](posterior pelvic tilt. ) Postural Control Postural Control: Deficits on evaluation  Balance Balance Balance Assessed: Yes Static Sitting Balance Static Sitting - Level of Assistance: 6: Modified independent (Device/Increase time) Dynamic Sitting Balance Dynamic Sitting - Level of Assistance: 5: Stand by assistance Static Standing Balance Static Standing - Level of Assistance: 4: Min assist Dynamic Standing Balance Dynamic Standing - Level of Assistance: 3: Mod assist Extremity Assessment  RUE Assessment RUE Assessment: Exceptions to WTen[facility]CRUE AROM  (degrees) RUE Overall AROM Comments: shoulder, elbow, wrist, hand ROM deficits  Right Shoulder Flexion: 80 Degrees (0-80) Right Elbow Flexion: 30 (0-30) Right Forearm Supination: 10 Degrees Right Wrist Extension: 5 Degrees RUE Strength RUE Overall Strength: Deficits RUE Overall Strength Comments: diminished distal strength Right Shoulder Flexion: 3-/5 Right Elbow Flexion: 2-/5 Right Forearm Supination: 1/5 Right Hand Gross Grasp: Impaired LUE Assessment LUE Assessment: Within Functional Limits RLE Assessment RLE Assessment: Exceptions to WPers[facility] HospitalRLE AROM (degrees) RLE Overall AROM Comments: lacking 50% hip flexion, unable to initiate DF from plantar flexed position. , knee WFL.  RLE PROM (degrees) RLE Overall PROM Comments: WFL.  RLE Strength RLE Overall Strength Comments: hip flexion 2+/5, knee extension/flexion 4+/5, PF 2/5, DF 1/5,  LLE Assessment LLE Assessment: Within Functional Limits   See Function Navigator for Current Functional Status.   Refer to Care Plan for Long Term Goals  Recommendations for other services: None  Discharge Criteria: Patient will be discharged from PT if patient refuses treatment 3 consecutive times without medical reason, if treatment goals not met, if there is a change in medical status, if patient makes no progress towards goals or if patient is discharged from hospital.  The above assessment, treatment plan, treatment alternatives and goals were discussed and mutually agreed upon: by patient and by family  ALorie Phenix8/04/2016, 2:15 PM

## 2016-01-21 NOTE — IPOC Note (Signed)
Overall Plan of Care (IPOC) Patient Details Name: [name] [name]. MRN: [id number] DOB: 1965-06-24  Admitting Diagnosis: CVA  Hospital Problems: Active Problems:   Left pontine CVA [facility])     Functional Problem List: Nursing Behavior, Bladder, Bowel, Endurance, Medication Management, Motor, Nutrition, Pain, Perception, Safety, Skin Integrity  PT Balance, Behavior, Motor, Perception, Safety  OT Endurance, Motor, Balance, Safety  SLP Linguistic  TR         Basic ADL's: OT Bathing, Dressing, Toileting, Grooming, Eating     Advanced  ADL's: OT       Transfers: PT Bed Mobility, Bed to Chair, Car, [employer], Floor  OT Toilet, Tub/Shower     Locomotion: PT Ambulation, Psychologist, prison and probation services, Stairs     Additional Impairments: OT Fuctional Use of Upper Extremity  SLP Communication expression    TR      Anticipated Outcomes Item Anticipated Outcome  Self Feeding Mod I  Swallowing      Basic self-care  Mod I  Toileting  Mod I    Bathroom Transfers Mod I  Bowel/Bladder  manage bowel/bladder mod I  Transfers  mod I with LRAD.   Locomotion  Supervision-Mod I with LRAD  Communication  mod I  Cognition     Pain  Less than 3  Safety/Judgment  Will remain free of falls, skin breakdown and infection while on rehab   Therapy Plan: PT Intensity: Minimum of 1-2 x/day ,45 to 90 minutes PT Frequency: 5 out of 7 days PT Duration Estimated Length of Stay: 10-14 days.  OT Intensity: Minimum of 1-2 x/day, 45 to 90 minutes OT Frequency: 5 out of 7 days OT Duration/Estimated Length of Stay: 10-14 SLP Intensity: Minumum of 1-2 x/day, 30 to 90 minutes SLP Frequency: 1 to 3 out of 7 days SLP Duration/Estimated Length of Stay: 7-10 days       Team Interventions: Nursing Interventions Patient/Family Education, Bladder Management, Bowel Management, Disease Management/Prevention, Pain Management, Medication Management, Skin Care/Wound Management, Cognitive  Remediation/Compensation, Psychosocial Support, Discharge Planning  PT interventions Ambulation/gait training, Balance/vestibular training, Cognitive remediation/compensation, Community reintegration, Discharge planning, Disease management/prevention, DME/adaptive equipment instruction, Functional electrical stimulation, Functional mobility training, Neuromuscular re-education, Pain management, Patient/family education, Psychosocial support, Skin care/wound management, Stair training, Therapeutic Exercise, Therapeutic Activities, Splinting/orthotics, UE/LE Strength taining/ROM, UE/LE Coordination activities, Visual/perceptual remediation/compensation, Wheelchair propulsion/positioning  OT Interventions Warden/ranger, Firefighter, Discharge planning, Fish farm manager, Disease mangement/prevention, Functional mobility training, Neuromuscular re-education, Patient/family education, Psychosocial support, Self Care/advanced ADL retraining, Therapeutic Activities, Therapeutic Exercise, UE/LE Strength taining/ROM, UE/LE Coordination activities  SLP Interventions Cueing hierarchy, Environmental controls, Internal/external aids, Patient/family education  TR Interventions    SW/CM Interventions Discharge Planning, Psychosocial Support, Patient/Family Education    Team Discharge Planning: Destination: PT-Home ,OT- Home , SLP-Home Projected Follow-up: PT-Outpatient PT, OT-  24 hour supervision/assistance, Outpatient OT, SLP-Other (comment) (TBD pending progress made while inpatient ) Projected Equipment Needs: PT-Cane, Quad cane, To be determined, OT- To be determined, SLP-None recommended by SLP Equipment Details: PT- , OT-  Patient/family involved in discharge plannbing: PT- Patient, Family member/caregiver,  OT-Patient, SLP-Patient, Family member/caregiver  MD ELOS: 10-14 days. Medical Rehab Prognosis:  Good Assessment:  50 y.o.right handed malewith history of  hypertension, tobacco abuse, Chronic renal insufficiency with baseline creatinine 1.3-1.4and hyperlipidemia. Wife works during the day.Patient recently admitted to the hospital with a small left pontine infarct 01/15/2016 discharged to home 01/16/2016 at supervision level maintained on aspirin. Presented back 01/17/2016 with increasing right sided weakness as well as right facial droopand  slurred speech. Patient did not receive TPA. MRI of the brain did not show any new areas of infarct suspect evolution of prior pontine infarct. MRA of head and angiogram of neck showed no significant carotid stenosis in the neck. 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 Neurology follow-up suspect evolution of left pontine infarct. Patient remains on aspirin therapy. diagnosed diabetes mellitus with hemoglobin A1c 8.5 obtained obtained on sliding scale insulin.Pt with resulting deficits with gait, balance, RUE activity, swallowing. Will set goals for Mod I with therapies.   See Team Conference Notes for weekly updates to the plan of care

## 2016-01-21 NOTE — Progress Notes (Addendum)
[facility] PHYSICAL MEDICINE & REHABILITATION     PROGRESS NOTE  Subjective/Complaints:  Pt seen sitting at the edge of the bed this AM.  He is ready to begin therapies, particularly for a bath.   ROS: Denies CP, SOB, N/V/D.  Objective: Vital Signs: Blood pressure (!) 164/98, pulse 71, temperature 98.1 F (36.7 C), temperature source Oral, resp. rate 16, height  (1.803 m), weight 113.5 kg (250 lb 3.2 oz), SpO2 94 %. No results found.  Recent Labs  01/20/16 1810 01/21/16 0530  WBC 8.3 8.3  HGB 15.7 15.5  HCT 47.0 47.3  PLT 196 194    Recent Labs  01/20/16 0158 01/20/16 1810 01/21/16 0530  NA 138  --  139  K 3.3*  --  3.6  CL 105  --  102  GLUCOSE 121*  --  140*  BUN 13  --  15  CREATININE 0.69 0.83 0.74  CALCIUM 8.5*  --  9.1   CBG (last 3)   Recent Labs  01/20/16 1638 01/20/16 2052 01/21/16 0648  GLUCAP 110* 166* 137*    Wt Readings from Last 3 Encounters:  01/20/16 113.5 kg (250 lb 3.2 oz)  01/17/16 117.9 kg (260 lb)  01/15/16 117.9 kg (260 lb)    Physical Exam:  BP (!) 164/98 (BP Location: Left Arm)   Pulse 71   Temp 98.1 F (36.7 C) (Oral)   Resp 16   Ht  (1.803 m) Comment: stated  Wt 113.5 kg (250 lb 3.2 oz)   SpO2 94%   BMI 34.90 kg/m   Constitutional: He appears well-developedand well-nourished. NAD. HENT: Normocephalicand atraumatic.  Eyes: Conjunctivaeand EOMare normal.  Cardiovascular: Normal rateand regular rhythm.  Respiratory: Effort normaland breath sounds normal. No respiratory distress.  GI: Soft. Bowel sounds are normal. He exhibits no distension.  Musculoskeletal: He exhibits no edemaor tenderness.  Neurological: He is alertand oriented.  Dysarthric speech but intelligible.  Good awareness of deficits Right hemifacial wekaness Motor: RUE: shoulder abduction, elbow flexion/extension 2/5, wrist extension, hand grip 2-/5 RLE: Hip flexion 3-/5, knee extension 3+/5, ankle dorsi/plantar flexion 0/5 LUE/LLE:  5/5 proximal to distal Skin: Skin is warmand dry.  Psychiatric: He has a normal mood and affect. His behavior is normal. Thought contentnormal  Assessment/Plan: 1. 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 Physiatrist is providing close team supervision and 24 hour management of active medical problems listed below. Physiatrist and rehab team continue to assess barriers to discharge/monitor patient progress toward functional and medical goals.  Function:  Bathing Bathing position      Bathing parts      Bathing assist        Upper Body Dressing/Undressing Upper body dressing                    Upper body assist        Lower Body Dressing/Undressing Lower body dressing                                  Lower body assist        Toileting Toileting   Toileting steps completed by patient: Adjust clothing prior to toileting, Performs perineal hygiene, Adjust clothing after toileting   Toileting Assistive Devices: Grab bar or rail  Toileting assist Assist level:  Touching or steadying assistance (Pt.75%)   Transfers Chair/bed Optician, dispensingtransfer             Locomotion Ambulation           Wheelchair          Cognition Comprehension Comprehension assist level: Follows complex conversation/direction with no assist  Expression Expression assist level: Expresses basic needs/ideas: With no assist  Social Interaction Social Interaction assist level: Interacts appropriately with others - No medications needed.  Problem Solving Problem solving assist level: Solves basic problems with no assist  Memory Memory assist level: Complete Independence: No helper    Medical Problem List and Plan: 1. Right hemiplegia with dysarthria secondary to left pontine infarct with evolution secondary to small  vessel disease on 01/17/16.  Begin CIR 2. DVT Prophylaxis/Anticoagulation: Subcutaneous Lovenox. Monitor platelet counts of any signs of bleeding 3. Pain Management: Tylenol as needed 4. Hypertension.   Lisinopril 20 mg daily, HCTZ 12.5 mg daily.   Monitor with increased mobility  Will allow permissive HTN at present, and consider further adjustments next week 5. Neuropsych: This patient iscapable of making decisions on hisown behalf. 6. Skin/Wound Care: Routine skin checks 7. Fluids/Electrolytes/Nutrition: Routine I&O's with follow-up chemistries 8. Newly diagnosed diabetes mellitus. Hemoglobin A1c 8.5.   Check blood sugars before meals and at bedtime.   Provide diabetic teaching.  Metformin started 8/11 9.Chronic renal insufficiency. Baseline creatinine 1.3-1.4.   BMP WNL on 8/11 10.Hyperlipidemia. Lipitor 11.Tobacco abuse. Nicoderm patch. Provide counseling  LOS (Days) 1 A FACE TO FACE EVALUATION WAS PERFORMED  [name] [name] [name] 01/21/2016 9:56 AM

## 2016-01-22 ENCOUNTER — Inpatient Hospital Stay (HOSPITAL_COMMUNITY): Payer: Managed Care, Other (non HMO) | Admitting: Physical Therapy

## 2016-01-22 DIAGNOSIS — E785 Hyperlipidemia, unspecified: Secondary | ICD-10-CM

## 2016-01-22 LAB — GLUCOSE, CAPILLARY
GLUCOSE-CAPILLARY: 126 mg/dL — AB (ref 65–99)
GLUCOSE-CAPILLARY: 130 mg/dL — AB (ref 65–99)
Glucose-Capillary: 119 mg/dL — ABNORMAL HIGH (ref 65–99)
Glucose-Capillary: 136 mg/dL — ABNORMAL HIGH (ref 65–99)

## 2016-01-22 NOTE — Progress Notes (Signed)
Physical Therapy Session Note  Patient Details  Name: [name] [name]. MRN: [id number] Date of Birth: 08/07/1965  Today's Date: 01/22/2016 PT Individual Time: 1300-1330 PT Individual Time Calculation (min): 30 min    Short Term Goals: Week 1:  PT Short Term Goal 1 (Week 1): Patient will consistently demonstrate safet transfers with supervision A and LRAD.  PT Short Term Goal 2 (Week 1): Patient will ambulate >192f with close supervision A and LRAD.  PT Short Term Goal 3 (Week 1): Patient will perform bed mobillity with supervision A.  PT Short Term Goal 4 (Week 1): Patient will ascend and descend 12 steps with 1 UE support and min A.   Skilled Therapeutic Interventions/Progress Updates:     Pt received sitting in WC and agreeable to PT. PT instructed patient Gait to gym with Min A for 175 ft and 1745fback to room at end of therapy session. RLE Foot drop noted with only 1 instance of toe drag on first bout of gait trianing. Patient noted to have decreased knee control with intermittent snap back at the end of treatment session and increased instances of toe drag.   Cybex at 25cm/sec. Seated marches 2 x 3 minutes with prolonged rest breaks. Standing with BUE support 2x 2 minutes with min A for improved R LE Knee control and increased WB through RLE. Noted improvement with increased WB following instruction from PT.   Pt left sitting in WC with call bell in reach. And all needs met.    Therapy Documentation Precautions:  Precautions Precautions: Fall Restrictions Weight Bearing Restrictions: No Pain:   0/10   See Function Navigator for Current Functional Status.   Therapy/Group: Individual Therapy  AuLorie Phenix/05/2016, 1:32 PM

## 2016-01-22 NOTE — Progress Notes (Signed)
[name] [name]. is a 50 y.o. male 1965-06-14 [id number]  Subjective: No new complaints. No new problems. Slept well. Feeling OK.  Objective: Vital signs in last 24 hours: Temp:  [98 F (36.7 C)] 98 F (36.7 C) (08/12 0512) Pulse Rate:  [69] 69 (08/12 0512) Resp:  [17] 17 (08/12 0512) BP: (182)/(95) 182/95 (08/12 0512) SpO2:  [96 %] 96 % (08/12 0512) Weight change:  Last BM Date: 01/21/16  Intake/Output from previous day: 08/11 0701 - 08/12 0700 In: 960 [P.O.:960] Out: -  Last cbgs: CBG (last 3)   Recent Labs  01/21/16 1639 01/21/16 2123 01/22/16 0643  GLUCAP 119* 130* 126*     Physical Exam General: No apparent distress   HEENT: not dry Lungs: Normal effort. Lungs clear to auscultation, no crackles or wheezes. Cardiovascular: Regular rate and rhythm, no edema Abdomen: S/NT/ND; BS(+) Musculoskeletal:  unchanged Neurological: No new neurological deficits Wounds: N/A    Skin: clear  Aging changes Mental state: Alert, oriented, cooperative    Lab Results: BMET    Component Value Date/Time   NA 139 01/21/2016 0530   K 3.6 01/21/2016 0530   CL 102 01/21/2016 0530   CO2 26 01/21/2016 0530   GLUCOSE 140 (H) 01/21/2016 0530   BUN 15 01/21/2016 0530   CREATININE 0.74 01/21/2016 0530   CREATININE 0.67 04/17/2014 1202   CALCIUM 9.1 01/21/2016 0530   GFRNONAA >60 01/21/2016 0530   GFRAA >60 01/21/2016 0530   CBC    Component Value Date/Time   WBC 8.3 01/21/2016 0530   RBC 5.00 01/21/2016 0530   HGB 15.5 01/21/2016 0530   HCT 47.3 01/21/2016 0530   PLT 194 01/21/2016 0530   MCV 94.6 01/21/2016 0530   MCV 92.8 04/17/2014 1223   MCH 31.0 01/21/2016 0530   MCHC 32.8 01/21/2016 0530   RDW 13.0 01/21/2016 0530   LYMPHSABS 2.2 01/21/2016 0530   MONOABS 0.9 01/21/2016 0530   EOSABS 0.2 01/21/2016 0530   BASOSABS 0.0 01/21/2016 0530    Studies/Results: No results found.  Medications: I have reviewed the patient's current  medications.  Assessment/Plan:   1. 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                     CIR 2. DVT Prophylaxis/Anticoagulation: Subcutaneous Lovenox. Monitor platelet counts of any signs of bleeding 3. Pain Management: Tylenol as needed 4. Hypertension.                      Lisinopril 20 mg daily, HCTZ 12.5 mg daily.                      Monitor with increased mobility                     Will allow permissive HTN at present 5. Neuropsych: This patient iscapable of making decisions on hisown behalf. 6. Skin/Wound Care: Routine skin checks 7. Fluids/Electrolytes/Nutrition: Routine I&O's with follow-up chemistries 8. Newly diagnosed diabetes mellitus. Hemoglobin A1c 8.5.                      Check blood sugars before meals and at bedtime.                      Provide diabetic teaching.  Metformin 9.Chronic renal insufficiency. Baseline creatinine 1.3-1.4.                      Monitor labs 10.Hyperlipidemia. Lipitor 11.Tobacco abuse. Nicoderm patch. Provided counseling    Length of stay, days: 2  Sonda PrimesAlex Plotnikov , MD 01/22/2016, 9:51 AM

## 2016-01-23 ENCOUNTER — Inpatient Hospital Stay (HOSPITAL_COMMUNITY): Payer: Managed Care, Other (non HMO) | Admitting: Occupational Therapy

## 2016-01-23 ENCOUNTER — Inpatient Hospital Stay (HOSPITAL_COMMUNITY): Payer: Managed Care, Other (non HMO) | Admitting: Physical Therapy

## 2016-01-23 LAB — GLUCOSE, CAPILLARY
GLUCOSE-CAPILLARY: 103 mg/dL — AB (ref 65–99)
Glucose-Capillary: 142 mg/dL — ABNORMAL HIGH (ref 65–99)
Glucose-Capillary: 104 mg/dL — ABNORMAL HIGH (ref 65–99)
Glucose-Capillary: 117 mg/dL — ABNORMAL HIGH (ref 65–99)

## 2016-01-23 NOTE — Plan of Care (Deleted)
Problem: RH BLADDER ELIMINATION Goal: RH STG MANAGE BLADDER WITH ASSISTANCE STG Manage Bladder With min Assistance   Outcome: Not Progressing Patient has foley in

## 2016-01-23 NOTE — Progress Notes (Signed)
Physical Therapy Session Note  Patient Details  Name: [name] [name]. MRN: [id number] Date of Birth: [birth year]  Today's Date: 01/23/2016 PT Individual Time: 1104-1200 and 1539-1620 PT Individual Time Calculation (min): 56 min and 41 min   Short Term Goals: Week 1:  PT Short Term Goal 1 (Week 1): Patient will consistently demonstrate safet transfers with supervision A and LRAD.  PT Short Term Goal 2 (Week 1): Patient will ambulate >16350ft with close supervision A and LRAD.  PT Short Term Goal 3 (Week 1): Patient will perform bed mobillity with supervision A.  PT Short Term Goal 4 (Week 1): Patient will ascend and descend 12 steps with 1 UE support and min A.   Skilled Therapeutic Interventions/Progress Updates:    Treatment 1: Pt received in w/c & agreeable to PT, denying c/o pain. Pt requested to use restroom; ambulated w/c>bathroom without AD & supervision. Pt able to stand without BUE support, (+) void. Session focused on gait training, stair negotiation, and Berg balance test. Pt ambulated 150 ft room>gym without AD demonstrating poor R foot clearance, hip hike R side, and absent heel strike RLE. Stair training completed with single rail (pt has 8 steps + 8 steps with alternating single rail to access 2nd level of home) and supervision overall without and with R AFO. Educated pt on compensatory strategy with pt requiring minimal cuing for sequencing during task. Pt able to ascend stairs with single rail on either side with supervision, but when coming down pt requires LUE on rail for balance & support therefore pt ascends stairs with R descending rail laterally leading with LLE to prevent crossing BLE. Educated pt on purpose, wear time, and skin checks for AFO. Gait training x 60 ft + 150 ft with RAFO with improved foot clearance and heel strike. Pt reports R knee recurvatum is improving daily, but educated pt on potential need for heel wedge if this does not continue to improve. Educated pt  on benefits of ambulating with SPC but pt reports he will not use AD at home. Pt performed Berg Balance Test & scored [phone]; educated pt on interpretation of score. Patient demonstrates increased fall risk as noted by score of 48/56 on Berg Balance Scale.  (<36= high risk for falls, close to 100%; 37-45 significant >80%; 46-51 moderate >50%; 52-55 lower >25%). During session, utilized cybex kinetron in sitting for reciprocal movements and BLE strengthening up to 20 cm/sec for 2 trials to pt fatigue. At end of session pt left sitting in w/c in room with wife present, all needs within reach & set up with lunch tray.  Treatment 2: Pt received sitting on EOB & agreeable to PT, denying c/o pain. Session focused on gait training, RUE neuro re-education and RLE strengthening. Gait training x 150 ft + 150 ft with R AFO room<>gym with supervision. Pt with increased foot clearance RLE but continues to demonstrate minimal R knee flexion during swing phase. In gym pt put pillow cases on pillows with BUE for forced neuro re-ed of RUE. Pt transferred sitting<>supine/sidelying on mat table with supervision. On mat table pt performed bridging with adductor hold, RLE single leg bridging with Min A from PT, and sidelying clamshells with theraband for added resistance. Pt required verbal & tactile cuing for proper positioning & technique. At end of session pt left sitting in w/c in room with all needs within reach.   Therapy Documentation Precautions:  Precautions Precautions: Fall Restrictions Weight Bearing Restrictions: No  Balance: Balance Balance Assessed: Yes  Standardized Balance Assessment Standardized Balance Assessment: Berg Balance Test Berg Balance Test Sit to Stand: Able to stand without using hands and stabilize independently Standing Unsupported: Able to stand safely 2 minutes Sitting with Back Unsupported but Feet Supported on Floor or Stool: Able to sit safely and securely 2 minutes Stand to Sit:  Uses backs of legs against chair to control descent Transfers: Able to transfer with verbal cueing and /or supervision Standing Unsupported with Eyes Closed: Able to stand 10 seconds safely Standing Ubsupported with Feet Together: Able to place feet together independently and stand 1 minute safely From Standing, Reach Forward with Outstretched Arm: Can reach confidently >25 cm (10") From Standing Position, Pick up Object from Floor: Able to pick up shoe safely and easily (with LUE) From Standing Position, Turn to Look Behind Over each Shoulder: Looks behind from both sides and weight shifts well Turn 360 Degrees: Able to turn 360 degrees safely one side only in 4 seconds or less (6 seconds to L, 6 seconds to R) Standing Unsupported, Alternately Place Feet on Step/Stool: Able to complete >2 steps/needs minimal assist (completes 8 steps in 13 seconds but requires supervision) Standing Unsupported, One Foot in Front: Able to place foot tandem independently and hold 30 seconds Standing on One Leg: Able to lift leg independently and hold > 10 seconds (stands on LLE) Total Score: 48   See Function Navigator for Current Functional Status.   Therapy/Group: Individual Therapy  Sandi Mariscal 01/23/2016, 12:17 PM

## 2016-01-23 NOTE — Progress Notes (Signed)
[name] RalphHerman Michelin Jr. is a 50 y.o. male 23-Oct-1965 [id number]  Subjective: No new complaints or problems. Slept well.   Objective: Vital signs in last 24 hours: Temp:  [97.6 F (36.4 C)-99.3 F (37.4 C)] 97.6 F (36.4 C) (08/13 0546) Pulse Rate:  [67-80] 67 (08/13 0546) Resp:  [17-18] 17 (08/13 0546) BP: (146-154)/(88-93) 146/93 (08/13 0546) SpO2:  [94 %-95 %] 95 % (08/13 0546) Weight change:  Last BM Date: 01/21/16  Intake/Output from previous day: 08/12 0701 - 08/13 0700 In: 960 [P.O.:960] Out: -  Last cbgs: CBG (last 3)   Recent Labs  01/22/16 1643 01/22/16 2130 01/23/16 0645  GLUCAP 130* 136* 142*     Physical Exam General: No apparent distress   HEENT: not dry Lungs: Normal effort. Lungs clear to auscultation, no crackles or wheezes. Cardiovascular: Regular rate and rhythm, no edema Abdomen: S/NT/ND; BS(+) Musculoskeletal:  unchanged Neurological: No new neurological deficits - R hemiparesis Wounds: N/A    Skin: clear  Aging changes Mental state: Alert, oriented, cooperative    Lab Results: BMET    Component Value Date/Time   NA 139 01/21/2016 0530   K 3.6 01/21/2016 0530   CL 102 01/21/2016 0530   CO2 26 01/21/2016 0530   GLUCOSE 140 (H) 01/21/2016 0530   BUN 15 01/21/2016 0530   CREATININE 0.74 01/21/2016 0530   CREATININE 0.67 04/17/2014 1202   CALCIUM 9.1 01/21/2016 0530   GFRNONAA >60 01/21/2016 0530   GFRAA >60 01/21/2016 0530   CBC    Component Value Date/Time   WBC 8.3 01/21/2016 0530   RBC 5.00 01/21/2016 0530   HGB 15.5 01/21/2016 0530   HCT 47.3 01/21/2016 0530   PLT 194 01/21/2016 0530   MCV 94.6 01/21/2016 0530   MCV 92.8 04/17/2014 1223   MCH 31.0 01/21/2016 0530   MCHC 32.8 01/21/2016 0530   RDW 13.0 01/21/2016 0530   LYMPHSABS 2.2 01/21/2016 0530   MONOABS 0.9 01/21/2016 0530   EOSABS 0.2 01/21/2016 0530   BASOSABS 0.0 01/21/2016 0530    Studies/Results: No results found.  Medications: I have reviewed the  patient's current medications.  Assessment/Plan:   1. 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                     CIR 2. DVT Prophylaxis/Anticoagulation: Subcutaneous Lovenox. Monitor platelet counts of any signs of bleeding 3. Pain Management: Tylenol as needed 4. Hypertension.                      Lisinopril 20 mg daily, HCTZ 12.5 mg daily.                      Monitor with increased mobility                     Will allow permissive HTN at present 5. Neuropsych: This patient iscapable of making decisions on hisown behalf. 6. Skin/Wound Care: Routine skin checks 7. Fluids/Electrolytes/Nutrition: Routine I&O's with follow-up chemistries 8. Newly diagnosed diabetes mellitus. Hemoglobin A1c 8.5.                      Check blood sugars before meals and at bedtime.                      Provide diabetic teaching.  Metformin 9.Chronic renal insufficiency. Baseline creatinine 1.3-1.4.                      Monitor labs 10.Hyperlipidemia. Lipitor 11.Tobacco abuse. Nicoderm patch.  Cont current Rx    Length of stay, days: 3  Sonda Primes , MD 01/23/2016, 8:31 AM

## 2016-01-23 NOTE — Progress Notes (Signed)
Occupational Therapy Session Note  Patient Details  Name: [name] RalphHerman January Jr. MRN: [id number] Date of Birth: 1966-01-10  Today's Date: 01/23/2016 OT Individual Time: [id number] and [id number]  OT Individual Time Calculation (min): 56 min and 36 minutes    Short Term Goals: Week 1:  OT Short Term Goal 1 (Week 1): Pt will complete lower body dressing with minimal assistance OT Short Term Goal 2 (Week 1): Pt will initiate R hand with min questioning cueing in his BADLs OT Short Term Goal 3 (Week 1): Pt will be issued HEP for R UE  OT Short Term Goal 4 (Week 1): Pt will maintain dynamic standing balance with 50% supervision  Skilled Therapeutic Interventions/Progress Updates:   Pt was seen this morning for self care mgt retraining focusing on NMRE of R UE. Pt was seated in w/c ready for shower. Pt ambulated into bathroom to shower bench with steadying assistance. Pt was provided cues for active WB when standing and adaptive techniques for washing R UE while seated. Pt instructed on completing sit<stand without UEs due to tendency to pull up with objects at eye level. UB/LB dressing completed at EOB with setup. Adaptive footstool utilized for reaching feet during LB dressing with success. Pt instructed on hemi dressing techniques with instruction on using R UE as gross stabilizer. Pt began laundry folding task in standing to finish in afternoon session to continue NMRE of R UE. Pt left in w/c with all needs within reach upon skilled OT departure.   2nd Session 1:1 Tx (36 minutes)  Pt participated in skilled OT session focusing on NMRE of R UE. Pt ambulated to therapy gym with new R LE AFO and steadying assistance without AD. Pt continued laundry folding task to focus on remediation of right shoulder flexion abilities, as well as grasp and release and bilateral integration. Pt completed task in standing with supervision and increased time. [facility] for  weakness in right deltoid. At end of session, pt progressed to completing grasp and release cone stacking task without AAROM and increased concentration. Pt instructed on visualization techniques to improve function. At end of session, pt returned to room. Pt left with PT at time of departure. No c/o pain.   Therapy Documentation Precautions:  Precautions Precautions: Fall Restrictions Weight Bearing Restrictions: No :    See Function Navigator for Current Functional Status.   Therapy/Group: Individual Therapy  Eyonna Sandstrom A Zimere Dunlevy 01/23/2016, 3:55 PM

## 2016-01-24 ENCOUNTER — Inpatient Hospital Stay (HOSPITAL_COMMUNITY): Payer: Managed Care, Other (non HMO) | Admitting: Speech Pathology

## 2016-01-24 ENCOUNTER — Inpatient Hospital Stay (HOSPITAL_COMMUNITY): Payer: Managed Care, Other (non HMO) | Admitting: Physical Therapy

## 2016-01-24 ENCOUNTER — Encounter (HOSPITAL_COMMUNITY): Payer: Managed Care, Other (non HMO)

## 2016-01-24 ENCOUNTER — Inpatient Hospital Stay (HOSPITAL_COMMUNITY): Payer: Managed Care, Other (non HMO) | Admitting: Occupational Therapy

## 2016-01-24 LAB — GLUCOSE, CAPILLARY
GLUCOSE-CAPILLARY: 125 mg/dL — AB (ref 65–99)
Glucose-Capillary: 127 mg/dL — ABNORMAL HIGH (ref 65–99)
Glucose-Capillary: 85 mg/dL (ref 65–99)
Glucose-Capillary: 127 mg/dL — ABNORMAL HIGH (ref 65–99)

## 2016-01-24 MED ORDER — LISINOPRIL 40 MG PO TABS
40.0000 mg | ORAL_TABLET | Freq: Every day | ORAL | Status: DC
  Administered 2016-01-25 – 2016-02-02 (×9): 40 mg via ORAL
  Filled 2016-01-24 (×3): qty 1
  Filled 2016-01-24: qty 2
  Filled 2016-01-24 (×5): qty 1

## 2016-01-24 MED ORDER — METFORMIN HCL 500 MG PO TABS
500.0000 mg | ORAL_TABLET | Freq: Two times a day (BID) | ORAL | Status: DC
  Administered 2016-01-24 – 2016-01-26 (×4): 500 mg via ORAL
  Filled 2016-01-24 (×4): qty 1

## 2016-01-24 MED ORDER — LISINOPRIL 20 MG PO TABS
20.0000 mg | ORAL_TABLET | Freq: Once | ORAL | Status: AC
  Administered 2016-01-24: 20 mg via ORAL
  Filled 2016-01-24: qty 1

## 2016-01-24 NOTE — Progress Notes (Signed)
Occupational Therapy Session Note  Patient Details  Name: [name] RalphHerman Appling Jr. MRN: [id number] Date of Birth: Jun 15, 1965  Today's Date: 01/24/2016 OT Individual Time: 0900-1004 OT Individual Time Calculation (min): 64 min   Short Term Goals: Week 1:  OT Short Term Goal 1 (Week 1): Pt will complete lower body dressing with minimal assistance OT Short Term Goal 2 (Week 1): Pt will initiate R hand with min questioning cueing in his BADLs OT Short Term Goal 3 (Week 1): Pt will be issued HEP for R UE  OT Short Term Goal 4 (Week 1): Pt will maintain dynamic standing balance with 50% supervision  Skilled Therapeutic Interventions/Progress Updates:   OT treatment session focused on NM Re-Ed, fine motor coordination,  Functional use of R UE, ADL re-training, and functional transfers. Pt ambulated in the room and the bathroom with supervision, and min steadying assist to transfer onto tub bench after removing R AFO. Pt able to grasp and remove Velcro on AFO with increased time and effort.  Pt completed bathing with supervision and VC for functional use of R UE. Mod A required for donning AFO and footwear. Pt able to grasp large handled toothbrush w/ R UE and bring toothbrush to mouth with OT providing elbow support. Pt had difficulty then isolating elbow/wrist/hand ROM and required L hand assist to complete activity thoroughly. Using small sponge cubes and yellow soft thera-putty, OT then worked on Chief of Staffneuromuscular re-education for R UE, incorporating weight bearing and proprioceptive feedback, with emphasis on gross grasp, pincer grasp, and attempting finger isolation (pt presents at Brunnstrom stage V). Pt continues to require VC throughout session to slow down and focus on incorporating normal movement patterns. Pt left seated in w/c in care of PT.  Therapy Documentation Precautions:  Precautions Precautions: Fall Restrictions Weight Bearing Restrictions: No Pain: Pain Assessment Pain Assessment:  No/denies pain Pain Score: 0-No pain ADL:  See Function Navigator for Current Functional Status.  Therapy/Group: Individual Therapy  Mal Amabilelisabeth S Jaleeyah Munce 01/24/2016, 1:38 PM

## 2016-01-24 NOTE — Progress Notes (Signed)
Recreational Therapy Session Note  Patient Details  Name: [name] RalphHerman Glasco Jr. MRN: [id number] Date of Birth: 01-10-1966 Today's Date: 01/24/2016  Pain: no c/o Skilled Therapeutic Interventions/Progress Updates: Pt participated in animal assisted activity/therapy seated w/c level with supervision.  [name] 01/24/2016, 3:00 PM

## 2016-01-24 NOTE — Progress Notes (Signed)
Physical Therapy Session Note  Patient Details  Name: [name] RalphHerman Blaydes Jr. MRN: [id number] Date of Birth: 06/29/1965  Today's Date: 01/24/2016 PT Individual Time: 1005-1103 and [id number] PT Individual Time Calculation (min): 58 min and 50 min   Short Term Goals: Week 1:  PT Short Term Goal 1 (Week 1): Patient will consistently demonstrate safet transfers with supervision A and LRAD.  PT Short Term Goal 2 (Week 1): Patient will ambulate >13850ft with close supervision A and LRAD.  PT Short Term Goal 3 (Week 1): Patient will perform bed mobillity with supervision A.  PT Short Term Goal 4 (Week 1): Patient will ascend and descend 12 steps with 1 UE support and min A.   Skilled Therapeutic Interventions/Progress Updates:    Treatment 1: Pt received in handoff from OT & agreeable to tx. Pt reported pain in R side of head, neck & shoulder (2-3/10) and RN made aware. Pt's BP = 156/101 mmHg; RN made aware & pt cleared for PT. Session focused on gait training & RLE strengthening, specifically hip flexors. Pt ambulated 150 ft + 150 ft room<>gym without AD & R AFO with minimal R knee & hip flexion during swing, minimal RUE swing & improved foot clearance. In gym pt performed standing hip flexion with UE support as needed for balance, with visual target for feedback & also with tapping ball. Pt performed supine R hip flexion focusing on concentric and eccentric control with pt having more difficulty with the latter. Performed standing hip flexion at 3" stairs with BUE support with PT facilitating posterior pelvic tilt to aide hip flexor movement. Pt reported R toes moving when foot tickled by daughter, in sitting pt performed R ankle dorsiflexion with mirror for visual feed back and this therapist observed 1/5 anterior tibialis muscle activation. At end of session pt left sitting in w/c in room with all needs within reach & daughter present.  Treatment 2: Pt received asleep in bed but woke up upon PT arrival. Pt  agreeable to PT denying c/o pain. Pt transferred to sitting EOB with supervision/mod I & use of bed rails. Pt donned L shoe but required total A for donning R shoe & AFO & tying shoe laces. Gait training x 150 ft room>gym with min A for 1 loss of balance but close supervision throughout remainder of activity. Utilized nu-step level 7 x 12 minutes with pt reporting up to 14 on Borg RPE scale & decreasing steps per minute from 67 to 40 with fatigue. Instructed pt on use of Biodex with pt utilizing device (limits of stability and ball catching game) with BUE and 1 UE support. Pt reports more difficulty shifting weight posteriorly as he feels as he is going to lose balance. Discussed potential outing with pt and pt reported he does not want to at this time. Pt reports when he goes places he wants to "walk normal". Educated pt not to discouraged if he does not return to his "normal" and discussed additional PT services after d/c from CIR. Pt became tearful, reporting he would like to be able to grasp objects with RUE. PT provided emotional support and listening. Gait training x 150 ft back to room & pt left sitting on EOB with family present.  Therapy Documentation Precautions:  Precautions Precautions: Fall Restrictions Weight Bearing Restrictions: No   See Function Navigator for Current Functional Status.   Therapy/Group: Individual Therapy  Sandi MariscalVictoria M Jw Covin 01/24/2016, 7:51 AM

## 2016-01-24 NOTE — Progress Notes (Signed)
Speech Language Pathology Daily Session Note  Patient Details  Name: [name] RalphHerman Leatham Jr. MRN: [id number] Date of Birth: Dec 21, 1965  Today's Date: 01/24/2016 SLP Individual Time: 1300-1330 SLP Individual Time Calculation (min): 30 min   Short Term Goals: Week 1: SLP Short Term Goal 1 (Week 1): STG=LTG due to ELOS for ST services  Skilled Therapeutic Interventions:  Pt was seen for skilled ST targeting cognitive goals.  SLP facilitated the session with a novel verbal barrier task targeting intelligibility in conversations.  Pt required x1 supervision level verbal cue for repetition to clarify speech but was otherwise mod I for intelligibility at the conversational level, even with environmental distractions.  Pt reports overall good intelligibility other than when he becomes fatigued; at which point he reports noticing increased "slurring" of his words.  Pt was left in wheelchair with daughter at bedside.  Continue per current plan of care.   Function:  Eating Eating                 Cognition Comprehension Comprehension assist level: Follows complex conversation/direction with no assist  Expression   Expression assist level: Expresses complex 90% of the time/cues < 10% of the time  Social Interaction Social Interaction assist level: Interacts appropriately with others - No medications needed.  Problem Solving Problem solving assist level: Solves complex problems: Recognizes & self-corrects  Memory Memory assist level: Complete Independence: No helper    Pain Pain Assessment Pain Assessment: No/denies pain Pain Score: 0-No pain  Therapy/Group: Individual Therapy  Layni Kreamer, Melanee SpryNicole L 01/24/2016, 3:38 PM

## 2016-01-24 NOTE — Progress Notes (Signed)
[place] PHYSICAL MEDICINE & REHABILITATION     PROGRESS NOTE  Subjective/Complaints:  Pt seen sitting up at the edge of the bed with family present.  He had a good weekend and is ready to resume his full day of therapies.   ROS: Denies CP, SOB, N/V/D.  Objective: Vital Signs: Blood pressure (!) 155/95, pulse 73, temperature 98.4 F (36.9 C), temperature source Oral, resp. rate 18, height 5\' 11"  (1.803 m), weight 113.5 kg (250 lb 3.2 oz), SpO2 97 %. No results found. No results for input(s): WBC, HGB, HCT, PLT in the last 72 hours. No results for input(s): NA, K, CL, GLUCOSE, BUN, CREATININE, CALCIUM in the last 72 hours.  Invalid input(s): CO CBG (last 3)   Recent Labs  01/23/16 1643 01/23/16 2056 01/24/16 0656  GLUCAP 104* 117* 127*    Wt Readings from Last 3 Encounters:  01/20/16 113.5 kg (250 lb 3.2 oz)  01/17/16 117.9 kg (260 lb)  01/15/16 117.9 kg (260 lb)    Physical Exam:  BP (!) 155/95 (BP Location: Left Arm)   Pulse 73   Temp 98.4 F (36.9 C) (Oral)   Resp 18   Ht 5\' 11"  (1.803 m) Comment: stated  Wt 113.5 kg (250 lb 3.2 oz)   SpO2 97%   BMI 34.90 kg/m   Constitutional: He appears well-developedand well-nourished. NAD. HENT: Normocephalicand atraumatic.  Eyes: Conjunctivaeand EOMare normal.  Cardiovascular: Normal rateand regular rhythm.  Respiratory: Effort normaland breath sounds normal. No respiratory distress.  GI: Soft. Bowel sounds are normal. He exhibits no distension.  Musculoskeletal: He exhibits no edemaor tenderness.  Neurological: He is alertand oriented.  Dysarthric speech but intelligible.  Good awareness of deficits Right hemifacial wekaness Motor: RUE: shoulder abduction, elbow flexion/extension 4-/5, wrist extension, hand grip 2/5 RLE: Hip flexion 3-/5, knee extension +/5, ankle dorsi/plantar flexion 0/5 LUE/LLE: 5/5 proximal to distal Skin: Skin is warmand dry.  Psychiatric: He has a normal mood and affect. His  behavior is normal. Thought contentnormal  Assessment/Plan: 1. 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 Physiatrist is providing close team supervision and 24 hour management of active medical problems listed below. Physiatrist and rehab team continue to assess barriers to discharge/monitor patient progress toward functional and medical goals.  Function:  Bathing Bathing position   Position: Shower  Bathing parts Body parts bathed by patient: Right arm, Left arm, Chest, Abdomen, Front perineal area, Buttocks, Right upper leg, Left upper leg, Right lower leg, Left lower leg Body parts bathed by helper: Back  Bathing assist Assist Level: Touching or steadying assistance(Pt > 75%)      Upper Body Dressing/Undressing Upper body dressing   What is the patient wearing?: Pull over shirt/dress     Pull over shirt/dress - Perfomed by patient: Thread/unthread right sleeve, Thread/unthread left sleeve, Put head through opening, Pull shirt over trunk          Upper body assist Assist Level: Supervision or verbal cues   Set up : To obtain clothing/put away  Lower Body Dressing/Undressing Lower body dressing   What is the patient wearing?: Underwear, Pants, Socks, Shoes Underwear - Performed by patient: Thread/unthread right underwear leg, Thread/unthread left underwear leg, Pull underwear up/down Underwear - Performed by helper: Pull underwear up/down, Thread/unthread right underwear leg Pants- Performed by patient: Thread/unthread right pants leg, Thread/unthread left pants leg, Pull pants up/down  Pants- Performed by helper: Thread/unthread right pants leg, Pull pants up/down     Socks - Performed by patient: Don/doff right sock, Don/doff left sock Socks - Performed by helper: Don/doff left sock Shoes - Performed by  patient: Don/doff right shoe, Don/doff left shoe Shoes - Performed by helper: Fasten right, Fasten left          Lower body assist Assist for lower body dressing: Touching or steadying assistance (Pt > 75%)      Toileting Toileting   Toileting steps completed by patient: Adjust clothing prior to toileting, Performs perineal hygiene, Adjust clothing after toileting   Toileting Assistive Devices: Grab bar or rail  Toileting assist Assist level: Supervision or verbal cues   Transfers Chair/bed transfer   Chair/bed transfer method: Stand pivot Chair/bed transfer assist level: Touching or steadying assistance (Pt > 75%) Chair/bed transfer assistive device: Armrests     Locomotion Ambulation     Max distance: 150 ft Assist level: Supervision or verbal cues   Wheelchair   Type: Manual Max wheelchair distance: 12250ft Assist Level: Supervision or verbal cues  Cognition Comprehension Comprehension assist level: Follows complex conversation/direction with no assist  Expression Expression assist level: Expresses complex ideas: With no assist  Social Interaction Social Interaction assist level: Interacts appropriately with others - No medications needed.  Problem Solving Problem solving assist level: Solves complex problems: Recognizes & self-corrects  Memory Memory assist level: Complete Independence: No helper    Medical Problem List and Plan: 1. 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  Cont CIR 2. DVT Prophylaxis/Anticoagulation: Subcutaneous Lovenox. Monitor platelet counts of any signs of bleeding 3. Pain Management: Tylenol as needed 4. Hypertension.   HCTZ 12.5 mg daily.   Monitor with increased mobility  Lisinopril 20 mg daily increased to 40 on 8/14 5. Neuropsych: This patient iscapable of making decisions on hisown behalf. 6. Skin/Wound Care: Routine skin checks 7.  Fluids/Electrolytes/Nutrition: Routine I&O's  8. Newly diagnosed diabetes mellitus. Hemoglobin A1c 8.5.   Check blood sugars before meals and at bedtime.   Provide diabetic teaching.  Metformin started 8/11, increased to BID on 8/14 9.?Chronic renal insufficiency, likely AKI as Cr has not stabalized  BMP WNL on 8/11 10.Hyperlipidemia. Lipitor 11.Tobacco abuse. Nicoderm patch. Provide counseling  LOS (Days) 4 A FACE TO FACE EVALUATION WAS PERFORMED  Herby Amick [name] Juliany Daughety 01/24/2016 10:53 AM

## 2016-01-25 ENCOUNTER — Inpatient Hospital Stay (HOSPITAL_COMMUNITY): Payer: Managed Care, Other (non HMO) | Admitting: Physical Therapy

## 2016-01-25 ENCOUNTER — Inpatient Hospital Stay (HOSPITAL_COMMUNITY): Payer: Managed Care, Other (non HMO) | Admitting: Occupational Therapy

## 2016-01-25 ENCOUNTER — Inpatient Hospital Stay (HOSPITAL_COMMUNITY): Payer: Managed Care, Other (non HMO) | Admitting: Speech Pathology

## 2016-01-25 ENCOUNTER — Encounter (HOSPITAL_COMMUNITY): Payer: Managed Care, Other (non HMO)

## 2016-01-25 DIAGNOSIS — G441 Vascular headache, not elsewhere classified: Secondary | ICD-10-CM

## 2016-01-25 DIAGNOSIS — M21371 Foot drop, right foot: Secondary | ICD-10-CM

## 2016-01-25 DIAGNOSIS — M216X9 Other acquired deformities of unspecified foot: Secondary | ICD-10-CM

## 2016-01-25 DIAGNOSIS — E669 Obesity, unspecified: Secondary | ICD-10-CM

## 2016-01-25 DIAGNOSIS — G8190 Hemiplegia, unspecified affecting unspecified side: Secondary | ICD-10-CM

## 2016-01-25 DIAGNOSIS — E119 Type 2 diabetes mellitus without complications: Secondary | ICD-10-CM

## 2016-01-25 DIAGNOSIS — E1169 Type 2 diabetes mellitus with other specified complication: Secondary | ICD-10-CM

## 2016-01-25 LAB — GLUCOSE, CAPILLARY
GLUCOSE-CAPILLARY: 107 mg/dL — AB (ref 65–99)
GLUCOSE-CAPILLARY: 121 mg/dL — AB (ref 65–99)
Glucose-Capillary: 141 mg/dL — ABNORMAL HIGH (ref 65–99)
Glucose-Capillary: 104 mg/dL — ABNORMAL HIGH (ref 65–99)

## 2016-01-25 NOTE — Progress Notes (Signed)
Occupational Therapy Session Note  Patient Details  Name: [name] [name]. MRN: [id number] Date of Birth: [birth year]  Today's Date: 01/25/2016 OT Individual Time: 0801-0901 OT Individual Time Calculation (min): 60 min   Short Term Goals: Week 1:  OT Short Term Goal 1 (Week 1): Pt will complete lower body dressing with minimal assistance OT Short Term Goal 2 (Week 1): Pt will initiate R hand with min questioning cueing in his BADLs OT Short Term Goal 3 (Week 1): Pt will be issued HEP for R UE  OT Short Term Goal 4 (Week 1): Pt will maintain dynamic standing balance with 50% supervision  Skilled Therapeutic Interventions/Progress Updates:   Skilled OT session focused on ADL re-training, neuromuscular re-integration, functional use of R UE, and dynamic standing balance. Pt ambulated to bathroom with supervision and transferred in/out of shower with supervision/ min steady assist for 1 near LOB. He completed bathing and dressing tasks with min verbal cues for functional use of R UE. OT then engaged pt in NM Re-ed of R UE using PNF movement patterns against gravity. OT encouraged pt to continue working on fine motor activities using foam cubes and cup in spare time.  Pt left seated in w/c at end of session with needs met.   Therapy Documentation Precautions:  Precautions Precautions: Fall Restrictions Weight Bearing Restrictions: No Pain: Pain Assessment Pain Assessment: 0-10 Pain Score: 2  Pain Type: Acute pain Pain Location: Head Pain Orientation: Right Pain Intervention(s): Shower ADL: See Function Navigator for Current Functional Status.   Therapy/Group: Individual Therapy  Valma Cava 01/25/2016, 3:01 PM

## 2016-01-25 NOTE — Progress Notes (Signed)
Orthopedic Tech Progress Note Patient Details:  [name] RalphHerman Christopherson Jr. 29-Dec-1965 [id number]  Patient ID: [name] RalphHerman Bertagnolli Jr., male   DOB: 29-Dec-1965, 50 y.o.   MRN: [id number]   [name] [name], [name] [name] 01/25/2016, 11:12 AM Called in advanced brace order;[facility]

## 2016-01-25 NOTE — Progress Notes (Signed)
Speech Language Pathology Daily Session Note  Patient Details  Name: [name] [name]. MRN: [id number] Date of Birth: [birth year]  Today's Date: 01/25/2016 SLP Individual Time: 1128-1200 SLP Individual Time Calculation (min): 32 min   Short Term Goals: Week 1: SLP Short Term Goal 1 (Week 1): STG=LTG due to ELOS for ST services  Skilled Therapeutic Interventions:  Pt was seen for skilled ST targeting communication goals.  SLP facilitated the session with a verbal reasoning task targeting use of intelligibility strategies in conversations.  Visual barrier utilized in addition to incorporation of background noise to increase task challenge.  Pt required x2 supervision level verbal cues for repetition to clarify speech but was otherwise mod I for intelligibility.  Pt was left in wheelchair with call bell within reach. Continue per current plan of care.    Function:  Eating Eating                 Cognition Comprehension Comprehension assist level: Follows complex conversation/direction with no assist  Expression   Expression assist level: Expresses complex 90% of the time/cues < 10% of the time  Social Interaction Social Interaction assist level: Interacts appropriately with others - No medications needed.  Problem Solving Problem solving assist level: Solves complex problems: Recognizes & self-corrects  Memory Memory assist level: Complete Independence: No helper    Pain Pain Assessment Pain Assessment: No/denies pain Pain Score: 0-No pain  Therapy/Group: Individual Therapy  [place]anger Chui, Melanee SpryNicole L 01/25/2016, 12:16 PM

## 2016-01-25 NOTE — Progress Notes (Addendum)
Physical Therapy Session Note  Patient Details  Name: [name] [name]. MRN: [id number] Date of Birth: 1966-06-11  Today's Date: 01/25/2016 PT Individual Time: 1000-1100 and 1301-1401 PT Individual Time Calculation (min): 60 min and 60 min   Short Term Goals: Week 1:  PT Short Term Goal 1 (Week 1): Patient will consistently demonstrate safet transfers with supervision A and LRAD.  PT Short Term Goal 2 (Week 1): Patient will ambulate >16450ft with close supervision A and LRAD.  PT Short Term Goal 3 (Week 1): Patient will perform bed mobillity with supervision A.  PT Short Term Goal 4 (Week 1): Patient will ascend and descend 12 steps with 1 UE support and min A.   Skilled Therapeutic Interventions/Progress Updates:    Treatment 1: Pt received in w/c & agreeable to PT. Pt reported 1-2/10 pain in head, R neck & R shoulder at rest that increased to 5/10 with palpation; pt denied pain medication. Gait training off of unit and outside with min A on one occasion to prevent loss of balance but otherwise Supervision A. Pt able to negotiate over brick and thresholds. Completed stair training outdoors with pt negotiating 5 steps + 4 steps with L rail with supervision. Throughout gait & stair training therapist provided cuing for increasing R knee flexion during swing phase and ascending/descending stairs. Pt able to recognize when he was compensating and limiting R knee flexion. On unit pt performed braiding with rail in hallway 30 ft x 4 with steady A focusing on placement of RLE & R hip flexion. Gait training x 150 ft back to room with Min A as pt experienced multiple losses of balance during task; pt not focusing on task and attempting to over exaggerate R hip flexion during gait which caused LOB. At end of session pt left sitting in w/c with all needs within reach.   Treatment 2: Pt received in w/c & agreeable to PT, noting pain in R side of head, shoulder & neck but denying pain medication. Orthotic  consult completed with Maisie Fushomas Naval architect(Hangar Representative) and assessed gait with and without R AFO. Pt to receive his own AFO tomorrow. Educated & discussed outing with pt & RT & pt agreeable to outing tomorrow. Utilized [employer] limits of stability with 1 and without BUE support to address weight shifting and balance training. Utilized nu-step on level 1 without LUE and focusing on using RUE & RLE mostly for task while allowing LLE to assist as needed. Pt continues to compensate with L side of body during all functional tasks. Gait training back to room with continued cuing to focus on increasing R hip & knee flexion during RLE swing phase; pt able to recognize when he is compensating. At end of session pt left sitting in w/c with all needs within reach.   During session therapist heavily educated pt that he must be cleared by MD to return to driving. Pt responded, "I can drive".  Addendum: Discussed f/u therapy services with pt. Pt reports he does not have transportation to [facility] clinic following d/c. Educated pt on recommendation of HHPT & pt agreeable.  Therapy Documentation Precautions:  Precautions Precautions: Fall Restrictions Weight Bearing Restrictions: No   See Function Navigator for Current Functional Status.   Therapy/Group: Individual Therapy  Sandi MariscalVictoria M Miller 01/25/2016, 12:17 PM

## 2016-01-25 NOTE — Progress Notes (Signed)
[place] PHYSICAL MEDICINE & REHABILITATION     PROGRESS NOTE  Subjective/Complaints:  Pt bathing with OT this AM.  He has a headache, but does not want pain medication for it.  It is tolerable and he prefers the sensation to no sensation.  ROS: +HA. Denies CP, SOB, N/V/D.  Objective: Vital Signs: Blood pressure 140/80, pulse 71, temperature 97.8 F (36.6 C), temperature source Oral, resp. rate 18, height 5\' 11"  (1.803 m), weight 113.5 kg (250 lb 3.2 oz), SpO2 99 %. No results found. No results for input(s): WBC, HGB, HCT, PLT in the last 72 hours. No results for input(s): NA, K, CL, GLUCOSE, BUN, CREATININE, CALCIUM in the last 72 hours.  Invalid input(s): CO CBG (last 3)   Recent Labs  01/24/16 1653 01/24/16 2121 01/25/16 0703  GLUCAP 127* 125* 141*    Wt Readings from Last 3 Encounters:  01/20/16 113.5 kg (250 lb 3.2 oz)  01/17/16 117.9 kg (260 lb)  01/15/16 117.9 kg (260 lb)    Physical Exam:  BP 140/80   Pulse 71   Temp 97.8 F (36.6 C) (Oral)   Resp 18   Ht 5\' 11"  (1.803 m) Comment: stated  Wt 113.5 kg (250 lb 3.2 oz)   SpO2 99%   BMI 34.90 kg/m   Constitutional: He appears well-developedand well-nourished. NAD. HENT: Normocephalicand atraumatic.  Eyes: Conjunctivaeand EOMare normal.  Cardiovascular: Normal rateand regular rhythm.  Respiratory: Effort normaland breath sounds normal. No respiratory distress.  GI: Soft. Bowel sounds are normal. He exhibits no distension.  Musculoskeletal: He exhibits no edemaor tenderness.  Neurological: He is alertand oriented.  Dysarthric speech but intelligible.  Good awareness of deficits Right hemifacial wekaness Motor: RUE: shoulder abduction, elbow flexion/extension 4-/5, wrist extension, hand grip 2/5 RLE: Hip flexion 3-/5, knee extension +/5, ankle dorsi/plantar flexion 1/5, wiggles toes LUE/LLE: 5/5 proximal to distal Skin: Skin is warmand dry.  Psychiatric: He has a normal mood and affect. His  behavior is normal. Thought contentnormal  Assessment/Plan: 1. 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 Physiatrist is providing close team supervision and 24 hour management of active medical problems listed below. Physiatrist and rehab team continue to assess barriers to discharge/monitor patient progress toward functional and medical goals.  Function:  Bathing Bathing position   Position: Shower  Bathing parts Body parts bathed by patient: Right arm, Left arm, Chest, Abdomen, Front perineal area, Buttocks, Right upper leg, Left upper leg, Right lower leg, Left lower leg Body parts bathed by helper: Back  Bathing assist Assist Level: Supervision or verbal cues      Upper Body Dressing/Undressing Upper body dressing   What is the patient wearing?: Pull over shirt/dress     Pull over shirt/dress - Perfomed by patient: Thread/unthread right sleeve, Thread/unthread left sleeve, Put head through opening, Pull shirt over trunk          Upper body assist Assist Level: Supervision or verbal cues   Set up : To obtain clothing/put away  Lower Body Dressing/Undressing Lower body dressing   What is the patient wearing?: Pants, Underwear, Socks, Shoes, AFO Underwear - Performed by patient: Thread/unthread right underwear leg, Thread/unthread left underwear leg, Pull underwear up/down Underwear - Performed by helper: Pull underwear up/down, Thread/unthread right underwear leg Pants- Performed by patient: Thread/unthread right pants leg, Thread/unthread left pants leg, Pull pants up/down Pants- Performed by  helper: Thread/unthread right pants leg, Pull pants up/down     Socks - Performed by patient: Don/doff right sock, Don/doff left sock Socks - Performed by helper: Don/doff left sock Shoes - Performed by patient: Don/doff  left shoe Shoes - Performed by helper: Don/doff right shoe, Fasten right, Fasten left   AFO - Performed by helper: Don/doff right AFO      Lower body assist Assist for lower body dressing: Touching or steadying assistance (Pt > 75%)      Toileting Toileting   Toileting steps completed by patient: Adjust clothing prior to toileting, Performs perineal hygiene, Adjust clothing after toileting   Toileting Assistive Devices: Grab bar or rail  Toileting assist Assist level: Supervision or verbal cues   Transfers Chair/bed transfer   Chair/bed transfer method: Ambulatory Chair/bed transfer assist level: Supervision or verbal cues Chair/bed transfer assistive device: Orthosis     Locomotion Ambulation     Max distance: 150 ft Assist level: Supervision or verbal cues   Wheelchair   Type: Manual Max wheelchair distance: 14450ft Assist Level: Supervision or verbal cues  Cognition Comprehension Comprehension assist level: Follows complex conversation/direction with no assist  Expression Expression assist level: Expresses complex 90% of the time/cues < 10% of the time  Social Interaction Social Interaction assist level: Interacts appropriately with others - No medications needed.  Problem Solving Problem solving assist level: Solves complex problems: Recognizes & self-corrects  Memory Memory assist level: Complete Independence: No helper    Medical Problem List and Plan: 1. 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  Cont CIR  Braces ordered 2. DVT Prophylaxis/Anticoagulation: Subcutaneous Lovenox. Monitor platelet counts of any signs of bleeding 3. Pain Management: Tylenol as needed 4. Hypertension.   HCTZ 12.5 mg daily.   Monitor with increased mobility  Lisinopril 20 mg daily increased to 40 on 8/14  Will cont to monitor and consider increasing meds tomorrow 5. Neuropsych: This patient  iscapable of making decisions on hisown behalf. 6. Skin/Wound Care: Routine skin checks 7. Fluids/Electrolytes/Nutrition: Routine I&O's  8. Newly diagnosed diabetes mellitus. Hemoglobin A1c 8.5.   Check blood sugars before meals and at bedtime.   Provide diabetic teaching.  Metformin started 8/11, increased to BID on 8/14  Will cont to monitor and consider further increase tomorrow 9.?Chronic renal insufficiency, likely AKI as Cr has not stabalized  BMP WNL on 8/11 10.Hyperlipidemia. Lipitor 11.Tobacco abuse. Nicoderm patch. Provide counseling 12. Obesity:   Body mass index is 34.9 kg/m.  Diet and exercise education  Will cont to encourage weight loss to increase endurance and promote overall health   LOS (Days) 5 A FACE TO FACE EVALUATION WAS PERFORMED  [name] 01/25/2016 9:43 AM

## 2016-01-26 ENCOUNTER — Inpatient Hospital Stay (HOSPITAL_COMMUNITY): Payer: Managed Care, Other (non HMO) | Admitting: Speech Pathology

## 2016-01-26 ENCOUNTER — Encounter (HOSPITAL_COMMUNITY): Payer: Managed Care, Other (non HMO) | Admitting: *Deleted

## 2016-01-26 ENCOUNTER — Inpatient Hospital Stay (HOSPITAL_COMMUNITY): Payer: Managed Care, Other (non HMO) | Admitting: Occupational Therapy

## 2016-01-26 LAB — GLUCOSE, CAPILLARY
GLUCOSE-CAPILLARY: 120 mg/dL — AB (ref 65–99)
GLUCOSE-CAPILLARY: 97 mg/dL (ref 65–99)
GLUCOSE-CAPILLARY: 109 mg/dL — AB (ref 65–99)
Glucose-Capillary: 145 mg/dL — ABNORMAL HIGH (ref 65–99)

## 2016-01-26 MED ORDER — CHLORTHALIDONE 25 MG PO TABS: 25.0000 mg | ORAL_TABLET | Freq: Every day | ORAL | Status: DC

## 2016-01-26 MED ORDER — METFORMIN HCL 850 MG PO TABS
850.0000 mg | ORAL_TABLET | Freq: Two times a day (BID) | ORAL | Status: DC
  Administered 2016-01-26 – 2016-01-31 (×10): 850 mg via ORAL
  Filled 2016-01-26 (×10): qty 1

## 2016-01-26 MED ORDER — CHLORTHALIDONE 25 MG PO TABS
25.0000 mg | ORAL_TABLET | Freq: Every day | ORAL | Status: DC
  Administered 2016-01-27: 25 mg via ORAL
  Filled 2016-01-26: qty 1

## 2016-01-26 MED ORDER — FLUOXETINE HCL 10 MG PO CAPS
10.0000 mg | ORAL_CAPSULE | Freq: Every day | ORAL | Status: DC
  Administered 2016-01-26 – 2016-02-01 (×7): 10 mg via ORAL
  Filled 2016-01-26 (×7): qty 1

## 2016-01-26 MED ORDER — HYDROCHLOROTHIAZIDE 12.5 MG PO CAPS
12.5000 mg | ORAL_CAPSULE | Freq: Once | ORAL | Status: AC
  Administered 2016-01-26: 12.5 mg via ORAL
  Filled 2016-01-26: qty 1

## 2016-01-26 NOTE — Progress Notes (Signed)
Orthopedic Tech Progress Note Patient Details:  [name] RalphHerman Tensley Jr. 1965/10/09 [id number] Brace orders completed by hanger vendor. Patient ID: [name] RalphHerman Bordelon Jr., male   DOB: 1965/10/09, 50 y.o.   MRN: [id number]   [name], [name] 01/26/2016, 3:04 PM

## 2016-01-26 NOTE — Progress Notes (Signed)
[facility] PHYSICAL MEDICINE & REHABILITATION     PROGRESS NOTE  Subjective/Complaints:  Pt sitting up at the edge of his bed. His strength is improving.  ROS: Denies CP, SOB, N/V/D.  Objective: Vital Signs: Blood pressure (!) 145/87, pulse 80, temperature 98.2 F (36.8 C), temperature source Oral, resp. rate 17, height 5\' 11"  (1.803 m), weight 113.5 kg (250 lb 3.2 oz), SpO2 98 %. No results found. No results for input(s): WBC, HGB, HCT, PLT in the last 72 hours. No results for input(s): NA, K, CL, GLUCOSE, BUN, CREATININE, CALCIUM in the last 72 hours.  Invalid input(s): CO CBG (last 3)   Recent Labs  01/25/16 1650 01/25/16 2110 01/26/16 0631  GLUCAP 104* 121* 120*    Wt Readings from Last 3 Encounters:  01/20/16 113.5 kg (250 lb 3.2 oz)  01/17/16 117.9 kg (260 lb)  01/15/16 117.9 kg (260 lb)    Physical Exam:  BP (!) 145/87 (BP Location: Left Arm)   Pulse 80   Temp 98.2 F (36.8 C) (Oral)   Resp 17   Ht 5\' 11"  (1.803 m) Comment: stated  Wt 113.5 kg (250 lb 3.2 oz)   SpO2 98%   BMI 34.90 kg/m   Constitutional: He appears well-developedand well-nourished. NAD. HENT: Normocephalicand atraumatic.  Eyes: Conjunctivaeand EOMare normal.  Cardiovascular: Normal rateand regular rhythm.  Respiratory: Effort normaland breath sounds normal. No respiratory distress.  GI: Soft. Bowel sounds are normal. He exhibits no distension.  Musculoskeletal: He exhibits no edemaor tenderness.  Neurological: He is alertand oriented.  Dysarthric speech but intelligible.  Good awareness of deficits Right hemifacial wekaness Motor: RUE: shoulder abduction, elbow flexion/extension 4/5, wrist extension, hand grip 4-/5 RLE: Hip flexion 3/5, knee extension 4+/5, ankle dorsi/plantar flexion 2/5, wiggles toes LUE/LLE: 5/5 proximal to distal Skin: Skin is warmand dry.  Psychiatric: He has a normal mood and affect. His behavior is normal. Thought  contentnormal  Assessment/Plan: 1. 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 Physiatrist is providing close team supervision and 24 hour management of active medical problems listed below. Physiatrist and rehab team continue to assess barriers to discharge/monitor patient progress toward functional and medical goals.  Function:  Bathing Bathing position   Position: Shower  Bathing parts Body parts bathed by patient: Right arm, Left arm, Chest, Abdomen, Front perineal area, Buttocks, Right upper leg, Left upper leg, Right lower leg, Left lower leg Body parts bathed by helper: Back  Bathing assist Assist Level: Supervision or verbal cues, Set up   Set up : To obtain items  Upper Body Dressing/Undressing Upper body dressing   What is the patient wearing?: Pull over shirt/dress     Pull over shirt/dress - Perfomed by patient: Thread/unthread right sleeve, Thread/unthread left sleeve, Put head through opening, Pull shirt over trunk          Upper body assist Assist Level: Supervision or verbal cues   Set up : To obtain clothing/put away  Lower Body Dressing/Undressing Lower body dressing   What is the patient wearing?: Pants, Underwear, Socks, Shoes, AFO Underwear - Performed by patient: Thread/unthread right underwear leg, Thread/unthread left underwear leg, Pull underwear up/down Underwear - Performed by helper: Pull underwear up/down, Thread/unthread right underwear leg Pants- Performed by patient: Thread/unthread right pants leg, Thread/unthread left pants leg, Pull pants up/down Pants- Performed by helper: Thread/unthread right pants leg, Pull pants  up/down     Socks - Performed by patient: Don/doff right sock, Don/doff left sock Socks - Performed by helper: Don/doff left sock Shoes - Performed by patient:  Don/doff left shoe Shoes - Performed by helper: Don/doff right shoe, Fasten right, Fasten left   AFO - Performed by helper: Don/doff right AFO      Lower body assist Assist for lower body dressing: Touching or steadying assistance (Pt > 75%)      Toileting Toileting   Toileting steps completed by patient: Adjust clothing prior to toileting, Performs perineal hygiene, Adjust clothing after toileting   Toileting Assistive Devices: Grab bar or rail  Toileting assist Assist level: Supervision or verbal cues   Transfers Chair/bed transfer   Chair/bed transfer method: Ambulatory Chair/bed transfer assist level: Supervision or verbal cues Chair/bed transfer assistive device: Orthosis     Locomotion Ambulation     Max distance: 200 ft Assist level: Supervision or verbal cues   Wheelchair   Type: Manual Max wheelchair distance: 13250ft Assist Level: Supervision or verbal cues  Cognition Comprehension Comprehension assist level: Follows complex conversation/direction with no assist  Expression Expression assist level: Expresses complex 90% of the time/cues < 10% of the time  Social Interaction Social Interaction assist level: Interacts appropriately with others - No medications needed.  Problem Solving Problem solving assist level: Solves complex problems: Recognizes & self-corrects  Memory Memory assist level: Complete Independence: No helper    Medical Problem List and Plan: 1. 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  Cont CIR  Braces ordered, not yet delivered  Fluoxetine started on 8/16 2. DVT Prophylaxis/Anticoagulation: Subcutaneous Lovenox. Monitor platelet counts of any signs of bleeding 3. Pain Management: Tylenol as needed 4. Hypertension.   HCTZ 12.5 mg daily, changed to 25 mg on 8/16, changed to chlorthalidone 25 on 8/17.   Monitor with increased mobility  Lisinopril 20 mg daily  increased to 40 on 8/14  Will cont to monitor and consider increasing meds tomorrow 5. Neuropsych: This patient iscapable of making decisions on hisown behalf. 6. Skin/Wound Care: Routine skin checks 7. Fluids/Electrolytes/Nutrition: Routine I&O's  8. Newly diagnosed diabetes mellitus. Hemoglobin A1c 8.5.   Check blood sugars before meals and at bedtime.   Provide diabetic teaching.  Metformin started 8/11, increased to BID on 8/14, increased to 850mg  BID on 8/16  Will cont to monitor and consider further increase tomorrow 9.?Chronic renal insufficiency, likely AKI as Cr has not stabalized  BMP WNL on 8/11 10.Hyperlipidemia. Lipitor 11.Tobacco abuse. Nicoderm patch. Provide counseling 12. Obesity:   Body mass index is 34.9 kg/m.  Diet and exercise education  Will cont to encourage weight loss to increase endurance and promote overall health   LOS (Days) 6 A FACE TO FACE EVALUATION WAS PERFORMED  Autumn Pruitt [name] Lillyana Majette 01/26/2016 8:56 AM

## 2016-01-26 NOTE — Consult Note (Signed)
NEUROBEHAVIORAL STATUS EXAM - [facility]   MEDICAL NECESSITY:  [name] Ralph[facility]erman Welshans Jr. [facility] for a neurobehavioral status exam owing to the patient's diagnosis of CVA, and to assist in treatment planning during admission.   Records indicate that Mr. [name] GuMitchell Jr. is a "50 y.o. right handed male with history of hypertension, tobacco abuse, chronic renal insufficiency with baseline creatinine 1.3-1.4 and hyperlipidemia. Per chart review patient lives with spouse in independent prior to admission ambulating without assistive device. 2 level home with 3 steps to entry and bedroom upstairs. Wife works during the day. Patient recently admitted to the hospital with a small left pontine infarct 01/15/2016 discharged to home 01/16/2016 at supervision level maintained on aspirin. Presented back 01/17/2016 with increasing right sided weakness as well as right facial droop and slurred speech. Patient did not receive TPA. MRI of the brain did not show any new areas of infarct suspect evolution of prior pontine infarct. MRA of head and angiogram of neck showed no significant carotid stenosis in the neck. 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 Neurology follow-up suspect evolution of left pontine infarct. Patient remains on aspirin therapy. Subcutaneous Lovenox added for DVT prophylaxis. Newly diagnosed diabetes mellitus with hemoglobin A1c 8.5 obtained on sliding scale insulin. Physical therapy evaluation completed 01/18/2016 and patient now requiring moderate assist for ambulation. Request made for physical medicine rehabilitation consult. Patient was admitted for a comprehensive rehabilitation program."   During today's visit, [name] denied experiencing any cognitive issues. He suffers right upper and lower motor deficits. Speech is mildly  slurred. Typical stroke recovery course was discussed.   Emotionally, [name] said he has been a "lil more emotional," frustrated, and angry. His anger manifests from the fact that he was not given TPA and then the stroke that he recently sustained evolved and worsened. He is unsure whether he will pursue legal action. He has been mildly depressed given his medical circumstances and will get tearful when thinking about his future. These feelings come and go. His psychiatric history is otherwise relatively benign. Suicidal/homicidal ideation, plan or intent was denied. No manic or hypomanic episodes were reported. The patient denied ever experiencing any auditory/visual hallucinations. No major behavioral or personality changes were endorsed.   [name] feels that he has been making progress in therapy. No barriers to therapy identified. He described the rehab staff as "great." He has his wife and family to support him throughout this admission. He does not feel that he will require much assistance with the transfer home and he is hopeful that he will eventually be able to return to work.     PROCEDURES: [2 units 96116] Diagnostic clinical interview  Review of available records Mental Status Exam  MENTAL STATUS EXAM: APPEARANCE:  Normal/appropriate GEN:  Alert and oriented MOOD:  Mildly depressed       AFFECT:  Appropriate.  SPEECH:  Clear  THOUGHT CONTENT:  Appropriate HALLUCINATIONS:  None INTELLIGENCE:  Average  INSIGHT:  Good JUDGMENT:  Good SUICIDAL IDEATION:  Denies SI   HOMICIDAL IDEATION:   Denies HI   IMPRESSION: Overall, [name] denied suffering any major cognitive deficits and no overt issues were observed. I encouraged him to consider cognitive testing if any difficulties are noted after he discharges home.   From an emotional standpoint, I believe that he is experiencing a normal adjustment reaction to his unfortunate  medical circumstance, and that his depressive  symptoms are subclinical, though we spent time processing these feelings and normalizing them. I encouraged him to let us know if they worsen and asked him to consider participation in the stroke survivor support group and he was agreeable. I do not feel that neuropsychology needs to follow [name] for the remainder of this admission unless requested by the patient or staff.   DIAGNOSIS:  CVA   Debbe MountsAdam T. Chanay Nugent, Psy.D., ABN Board-Certified Clinical Neuropsychologist

## 2016-01-26 NOTE — Progress Notes (Signed)
Occupational Therapy Session Note  Patient Details  Name: [name] RalphHerman Gebhart Jr. MRN: [id number] Date of Birth: 1965/09/03  Today's Date: 01/26/2016   Session 1 OT Individual Time: 09:32 - 10:15 OT Individual Time Calculation (min): 43  Session 2 OT Individual Time: 4742-59561030-1203 OT Individual Time Calculation (min): 93 min   Session 3 OT Individual Time:13:00 - 13:35 OT Individual Time Calculation (min): 35   Short Term Goals: Week 1:  OT Short Term Goal 1 (Week 1): Pt will complete lower body dressing with minimal assistance OT Short Term Goal 2 (Week 1): Pt will initiate R hand with min questioning cueing in his BADLs OT Short Term Goal 3 (Week 1): Pt will be issued HEP for R UE  OT Short Term Goal 4 (Week 1): Pt will maintain dynamic standing balance with 50% supervision  Skilled Therapeutic Interventions/Progress Updates:     Session 1 (43 min):  OT treatment session focused on functional shower transfers, ADL participation using R UE , activity tolerance, and dynamic standing balance. Pt completed shower transfer and bathing tasks with supervision and min verbal cues for use of R UE . Pt demonstrated improved shoulder and elbow flexion to reach top of head w/ R UE to rub in shampoo. Pt then complete grooming tasks in dynamic standing with supervision. Pt left seated in w/c at end of session.   Session 2 (93 min):  OT treatment focused on community integration, activity tolerance, neuromuscular re-education of R UE , and functional use of R UE . Pt greeted in room in wheelchair. Pt ambulated down hallway to elevators with supervision and was able to negotiate elevator buttons using R hand w/ increased time and effort. Pt then ambulated to coffee shop where pt ordered a coffee and paid with his credit card.  OT discussed safety awareness, energy conservation, and community/environmental management. Worked on functional use of R UE focusing on grasp to hold coffee cup and open splenda  packets. Pt able to bring R UE to mouth against gravity, but unable to complete this task while grasping coffee. Pt then participated in neuromuscular re-education using PNF patterns and gravity eliminated ROM of R shoulder/elbow/wrist/hand.  Session 3 (35 min):  OT treatment focused on neuromuscular re-education utilizing the Bioness to initiate R hand flexion and extension. Pt tolerated treatment well working on functional grasp and release.   Therapy Documentation Precautions:  Precautions Precautions: Fall Restrictions Weight Bearing Restrictions: No General: General PT Missed Treatment Reason: Patient unwilling to participate (Pt missed 90 minutes of scheduled outing. ) Vital Signs:  Pain: Pain Assessment Pain Assessment: 0-10 Pain Score: 0-No pain ADL: ADL Grooming: Supervision/safety Where Assessed-Grooming: Standing at sink Upper Body Bathing: Supervision/safety Where Assessed-Upper Body Bathing: Shower Lower Body Bathing: Supervision/safety Where Assessed-Lower Body Bathing: Shower Upper Body Dressing: Supervision/safety Where Assessed-Upper Body Dressing: Sitting at sink Lower Body Dressing: Minimal assistance Where Assessed-Lower Body Dressing: Sitting at sink  See Function Navigator for Current Functional Status.   Therapy/Group: Individual Therapy    Mal Amabilelisabeth S Zandon Talton 01/26/2016, 12:23 PM

## 2016-01-26 NOTE — Patient Care Conference (Signed)
Inpatient RehabilitationTeam Conference and Plan of Care Update Date: 01/26/2016   Time: 2:10 PM    Patient Name: [name] [name].      Medical Record Number: [id number]  Date of Birth: 1966-05-01 Sex: Male         Room/Bed: 4W25C/4W25C-01 Payor Info: Payor: [employer] / Plan: [employer] MANAGED / Product Type: *No Product type* /    Admitting Diagnosis: CVA  Admit Date/Time:  01/20/2016  4:56 PM Admission Comments: No comment available   Primary Diagnosis:  <principal problem not specified> Principal Problem: <principal problem not specified>  Patient Active Problem List   Diagnosis Date Noted  . Diabetes mellitus type 2 in obese ([place])   . Obesity   . Right foot drop   . Other vascular headache   . Left pontine CVA ([place]) 01/20/2016  . Benign essential HTN   . Cerebral thrombosis with cerebral infarction 01/17/2016  . Type 2 diabetes mellitus with hyperglycemia, without long-term current use of insulin ([place]) 01/17/2016  . Stroke ([place]) 01/17/2016  . Cerebral infarction due to unspecified mechanism 01/15/2016  . Cerebrovascular accident (CVA) due to thrombosis of basilar artery ([place])   . HLD (hyperlipidemia)   . Tobacco use disorder   . Stroke-like symptoms 01/14/2016  . Acute right-sided weakness 01/14/2016  . AKI (acute kidney injury) ([place]) 01/14/2016  . Accelerated hypertension 01/14/2016  . Right hemiparesis ([place]) 01/14/2016    Expected Discharge Date: Expected Discharge Date: 02/03/16  Team Members Present: Physician leading conference: Dr. Delice Lesch Social Worker Present: Lennart Pall, LCSW Nurse Present: Rayetta Pigg, RN PT Present: Barrie Folk, PT OT Present: Other (comment) Grayland Ormond Doe, OT) SLP Present: Weston Anna, SLP PPS Coordinator present : Daiva Nakayama, RN, CRRN     Current Status/Progress Goal Weekly Team Focus  Medical   Right hemiplegia with dysarthria secondary to left pontine infarct with evolution  Improve safety, transfers, mobility, HTN, DM   See above   Bowel/Bladder   continent of bowel and bladder  min assist  continue to monitor q shift   Swallow/Nutrition/ Hydration             ADL's   Supervision w/ intermittent steadying A for functional shower transfers, supervision for bathing w/ min cues for functional use of R UE. supervision UB dressing, Min  A LB dressing  Mod I bathing, dressing, toileting, and functional transfers  R UE strengthening/fine and gross motor coordination, functional use of R UE, standing balance during ADLs   Mobility   supervision for ambulation with R AFO, steady A/supervision for stair negotiation with at least 1 rail, 1/5 R ankle dorsiflexion, supervision for standing balance  mod I bed mobility & transfers, mod I ambulation, mod I standing balance, supervision x 12 steps with 1 rail  RLE hip flexor strengthening, neuro re-ed, balance, gait training, orthotic consult   Communication   updated from 8/15: mod I, goals met, discharged from [facility] 8/16  mod I   D/C from SLP today   Safety/Cognition/ Behavioral Observations  no unsafe behavior  min assist  continue to monitor q shift   Pain   no complaints of pain  pain less than 3  continue to monitor q shift   Skin   no skin breakdown  no skin breakdown this admission  continue to monitor q shift    Rehab Goals Patient on target to meet rehab goals: Yes *See Care Plan and progress notes for long and short-term goals.  Barriers to Discharge: HTN, DM, safety,  mobility, transfers    Possible Resolutions to Barriers:  Adjust HTN and DM meds, therapies    Discharge Planning/Teaching Needs:  Planning for d/c home with wife who works days and 54 yo daughter   -hoping to meet mod ind goals      Team Discussion:  Continue to make adjustments to BP and DM meds.  Currently min assist overall with txs and with mod ind goals.  Using resting hand splint.  Still some concern about his impulsivity vs simply baseline personality.    Revisions to Treatment  Plan:  None   Continued Need for Acute Rehabilitation Level of Care: The patient requires daily medical management by a physician with specialized training in physical medicine and rehabilitation for the following conditions: Daily direction of a multidisciplinary physical rehabilitation program to ensure safe treatment while eliciting the highest outcome that is of practical value to the patient.: Yes Daily medical management of patient stability for increased activity during participation in an intensive rehabilitation regime.: Yes Daily analysis of laboratory values and/or radiology reports with any subsequent need for medication adjustment of medical intervention for : Neurological problems;Diabetes problems;Blood pressure problems  [name] [name] 01/26/2016, 3:55 PM

## 2016-01-26 NOTE — Progress Notes (Signed)
Physical Therapy Session Note  Patient Details  Name: [name] [name]. MRN: [id number] Date of Birth: 1965/12/14  Today's Date: 01/26/2016  Pt missed 90 minutes of skilled PT treatment as pt refused to attend outing.  General: PT Amount of Missed Time (min): 90 Minutes PT Missed Treatment Reason: Patient unwilling to participate (Pt missed 90 minutes of scheduled outing. )  See Function Navigator for Current Functional Status.    Sandi MariscalVictoria M Martia Dalby 01/26/2016, 12:20 PM

## 2016-01-26 NOTE — Progress Notes (Signed)
Speech Language Pathology Discharge Summary  Patient Details  Name: [name] [name]. MRN: [id number] Date of Birth: 11-30-1965  Today's Date: 01/26/2016 SLP Individual Time: 6047-9987 SLP Individual Time Calculation (min): 55 min   Skilled Therapeutic Interventions:  Skilled treatment session focused on speech goals and completion of patient education. Patient was 100% intelligible throughout a functional conversation with Mod I and required intermittent verbal cues for use of a slow rate during an oral reading task at the paragraph level to maximize intelligibility to 100%. Patient educated in regards to basic speech intelligibility strategies and strategies to utilize to maximize intelligibility when fatigued. Patient left upright in wheelchair with daughter present and all needs within reach. Patient's goals are met at this time, therefore, he will be discharged from skilled SLP intervention. Patient verbalized understanding of all information and was in agreement with recommendations.   Patient has met 1 of 1 long term goals.  Patient to discharge at overall Modified Independent level.   Reasons goals not met: N/A   Clinical Impression/Discharge Summary: Patient has made functional gains and has met 1 of 1 LTG's this admission due to improved speech intelligibility. Currently, patient is ~90-100% intelligible at the conversation level with Mod I. Patient education is complete and patient will be discharged from skilled SLP intervention due to all goals are met at this time. Skilled SLP f/u is not warranted at this time. Patient verbalized understanding and agreement.   Care Partner:  Caregiver Able to Provide Assistance: Yes     Recommendation:  None      Equipment: N/A   Reasons for discharge: Treatment goals met   Patient/Family Agrees with Progress Made and Goals Achieved: Yes   Function:  Cognition Comprehension Comprehension assist level: Follows complex  conversation/direction with no assist  Expression   Expression assist level: Expresses complex ideas: With extra time/assistive device  Social Interaction Social Interaction assist level: Interacts appropriately with others - No medications needed.  Problem Solving Problem solving assist level: Solves complex problems: Recognizes & self-corrects  Memory Memory assist level: Complete Independence: No helper   [name] 01/26/2016, 12:53 PM

## 2016-01-27 ENCOUNTER — Inpatient Hospital Stay (HOSPITAL_COMMUNITY): Payer: Managed Care, Other (non HMO) | Admitting: Physical Therapy

## 2016-01-27 ENCOUNTER — Inpatient Hospital Stay (HOSPITAL_COMMUNITY): Payer: Managed Care, Other (non HMO) | Admitting: Occupational Therapy

## 2016-01-27 LAB — BASIC METABOLIC PANEL
Anion gap: 9 (ref 5–15)
BUN: 17 mg/dL (ref 6–20)
CHLORIDE: 101 mmol/L (ref 101–111)
CO2: 29 mmol/L (ref 22–32)
CREATININE: 0.97 mg/dL (ref 0.61–1.24)
Calcium: 9.3 mg/dL (ref 8.9–10.3)
GFR calc Af Amer: >60 mL/min (ref >60)
GFR calc non Af Amer: >60 mL/min (ref >60)
GLUCOSE: 127 mg/dL — AB (ref 65–99)
Potassium: 3.8 mmol/L (ref 3.5–5.1)
SODIUM: 139 mmol/L (ref 135–145)

## 2016-01-27 LAB — CBC WITH DIFFERENTIAL/PLATELET
BASOS PCT: 1 %
Basophils Absolute: 0 10*3/uL (ref 0.0–0.1)
Eosinophils Absolute: 0.2 10*3/uL (ref 0.0–0.7)
Eosinophils Relative: 2 %
HEMATOCRIT: 43.7 % (ref 39.0–52.0)
Hemoglobin: 14.3 g/dL (ref 13.0–17.0)
LYMPHS ABS: 2.5 10*3/uL (ref 0.7–4.0)
LYMPHS PCT: 30 %
MCH: 31.4 pg (ref 26.0–34.0)
MCHC: 32.7 g/dL (ref 30.0–36.0)
MCV: 95.8 fL (ref 78.0–100.0)
MONO ABS: 0.9 10*3/uL (ref 0.1–1.0)
MONOS PCT: 11 %
Neutro Abs: 4.6 10*3/uL (ref 1.7–7.7)
Neutrophils Relative %: 56 %
Platelets: 241 10*3/uL (ref 150–400)
RBC: 4.56 MIL/uL (ref 4.22–5.81)
RDW: 13 % (ref 11.5–15.5)
WBC: 8.1 10*3/uL (ref 4.0–10.5)

## 2016-01-27 LAB — GLUCOSE, CAPILLARY
GLUCOSE-CAPILLARY: 84 mg/dL (ref 65–99)
Glucose-Capillary: 134 mg/dL — ABNORMAL HIGH (ref 65–99)
Glucose-Capillary: 103 mg/dL — ABNORMAL HIGH (ref 65–99)
Glucose-Capillary: 120 mg/dL — ABNORMAL HIGH (ref 65–99)

## 2016-01-27 MED ORDER — CHLORTHALIDONE 50 MG PO TABS
50.0000 mg | ORAL_TABLET | Freq: Every day | ORAL | Status: DC
  Administered 2016-01-28 – 2016-01-30 (×3): 50 mg via ORAL
  Filled 2016-01-27 (×3): qty 1

## 2016-01-27 MED ORDER — CHLORTHALIDONE 25 MG PO TABS
25.0000 mg | ORAL_TABLET | Freq: Once | ORAL | Status: AC
  Administered 2016-01-27: 25 mg via ORAL
  Filled 2016-01-27: qty 1

## 2016-01-27 NOTE — Progress Notes (Signed)
Physical Therapy Session Note  Patient Details  Name: [name] [name]. MRN: [id number] Date of Birth: 1966/04/25  Today's Date: 01/27/2016 PT Individual Time: [id number] AND [id number] PT Individual Time Calculation (min): 85 min AND 53 min    Short Term Goals: Week 1:  PT Short Term Goal 1 (Week 1): Patient will consistently demonstrate safet transfers with supervision A and LRAD.  PT Short Term Goal 2 (Week 1): Patient will ambulate >16450ft with close supervision A and LRAD.  PT Short Term Goal 3 (Week 1): Patient will perform bed mobillity with supervision A.  PT Short Term Goal 4 (Week 1): Patient will ascend and descend 12 steps with 1 UE support and min A.   Skilled Therapeutic Interventions/Progress Updates:     Patient received sitting in WC and agreebale to PT.   Gait training for 59300ft x 2 with R AFO in controlled environment and supervision Assist from PT and 50 ft through coffee shop with supervision A Patient able to hold coffee in L hand and ambulate outside for 3975ft with supervision A form PT.  Gait on uneven surface on cement sidewalk and handicap ramp x 27050ft. Supervision A and min A x 1 prevent LOB with sudden turn. Throughout gait training PT provided intermittent cues for improved knee/hip flexion with swing through and increased heel strike with the RLE.   NMR: ankle DF 2x 10 within available range. Weight shifting to improve closed chain activation on Wii fit. Soccer x 1, tilt table x2, and skiiing x 2. PT provided supervision A and min cues for improved forward weight into forefoot and improved equal weight bearing to stabilize character as appropriate. Lateral toe taps on on 4 inch step. 2 x 12 BLE with emphasis on improve hip flexion  With abduciton improve movements out of synergies.  Dynamic gait training:  side stepping L and R for 3620ft x 4 each direction with minisquat on last set. Backward walking for 7320ft x8. PT provided min-mod cues with all dynamic gait for  improved hip flexion for foot clearance as well as increased pelvic stability and improved step length.   [facility] to [facility] and [facility] in reach.   Session 2:  Patient received sitting in WC and agreeable to PT.  Gait training in hall for 16980ft x 2 during session with with R AFO and supervision A. PT noted decreased pelvic stability with gait and reduced hip flexion due to RLE fatigue compared to gait training in previous session.   Following gait training. PT applied NMES to R anterior tib with empi small muscle atrophy setting at 20 sec off time, 35pps, for 15 min. Patient demonstrated near full range DF with stimulation. Following NMES PT instructed patient in [place] re-ed with AAROM DF followed by AROM PF 3 x 12. Patient able to demonstrate increased control of DF following stimulation.  PT also instructed patient in R LE D1 and D2 PNF patterns to encourage hip flexion as well as IR/ER out of synergies 2x 12.  Stansing balance with ball toss within BOS and slightly to R. Patient able to shift weight to R and catch ball 50% of the time, and demonstrated improved judgment to prevent falling to catch ball outside BOS.   [facility] returned to room and [facility] in reach.     Therapy Documentation Precautions:  Precautions Precautions: Fall Restrictions Weight Bearing Restrictions: No   Pain: 0/10  See Function Navigator for Current Functional Status.   Therapy/Group: Individual Therapy  Golden Popustin E Mellany Dinsmore 01/27/2016, 10:36 AM

## 2016-01-27 NOTE — Progress Notes (Signed)
Occupational Therapy Weekly and Daily Progress Note  Patient Details  Name: [name] [name]. MRN: [id number] Date of Birth: 1965-06-20  Beginning of progress report period: January 21, 2016 End of progress report period: January 27, 2016  Today's Date: 01/27/2016   Session 1 OT Individual Time: 08:00-08:57 OT Individual Time Calculation (min): 57 min    Session 2 OT Individual Time: 5374-8270 OT Individual Time Calculation (min): 44 min    Patient has met 4 of 4 short term goals.  Pt is completing functional transfers and most ADLs with supervision. He has made progress with functional use of R UE , but continues to require verbal cues to slow down and maintain safety awareness.   Patient continues to demonstrate the following deficits: muscle weakness, unbalanced muscle activation, motor apraxia and decreased coordination, and decreased standing balance and hemiplegia and therefore will continue to benefit from skilled OT intervention to enhance overall performance with BADL and iADL.  Patient progressing toward long term goals..  Continue plan of care.  OT Short Term Goals Week 1:  OT Short Term Goal 1 (Week 1): Pt will complete lower body dressing with minimal assistance OT Short Term Goal 1 - Progress (Week 1): Met OT Short Term Goal 2 (Week 1): Pt will initiate R hand with min questioning cueing in his BADLs OT Short Term Goal 2 - Progress (Week 1): Met OT Short Term Goal 3 (Week 1): Pt will be issued HEP for R UE  OT Short Term Goal 3 - Progress (Week 1): Met OT Short Term Goal 4 (Week 1): Pt will maintain dynamic standing balance with 50% supervision OT Short Term Goal 4 - Progress (Week 1): Met Week 2:  OT Short Term Goal 1 (Week 2): Pt will don/doff AFO using adaptive equipmet as needed with supervision  OT Short Term Goal 2 (Week 2): Pt will maintain dynamic standing balance while reaching outside base of support with 25% supervision OT Short Term Goal 3 (Week 2): Pt  will participate in R hand fine motor excercises daily without cues   Skilled Therapeutic Interventions/Progress Updates:    Session 1: OT session focused on functional use of R UE , dynamic standing balance,  ADL re-training, adaptive techniques and strategies for LB dressing, and fine motor activities. Pt completed shower transfer with supervision and completed bathing and UB dressing with supervision. Pt then completed grooming task in standing with min questioning cue for functional use of R UE  . Pt then participated in fine motor activities with R UE using thera-putty and foam cubes.  Session 2: OT session focused on neuromuscular re-education and increased functional use of R UE . Bioness used to facilitate full flexion/extension of R hand, while incorporating reaching and functional task of grasping and stacking large cups. Pt able to demonstrate improved finger opposition to first, second, and third finger. Pt returned to room at end of session and left with needs met.   Therapy Documentation Precautions:  Precautions Precautions: Fall Restrictions Weight Bearing Restrictions: No Pain: Pain Assessment Pain Assessment: No/denies pain ADL: ADL Grooming: Supervision/safety Where Assessed-Grooming: Standing at sink Upper Body Bathing: Supervision/safety Where Assessed-Upper Body Bathing: Shower Lower Body Bathing: Supervision/safety Where Assessed-Lower Body Bathing: Shower Upper Body Dressing: Supervision/safety Where Assessed-Upper Body Dressing: Sitting at sink Lower Body Dressing: Minimal assistance Where Assessed-Lower Body Dressing: Sitting at sink   Therapy/Group: Individual Therapy  Valma Cava 01/27/2016, 12:24 PM

## 2016-01-27 NOTE — Progress Notes (Signed)
Social Work Patient ID: [name] [name]., male   DOB: [birth year], 50 y.o.   MRN: [id number]   Met with pt yesterday following team conference.  He is aware and agreeable with targeted d/c date of 8/24 and mod independent goals.  Aware team monitoring safety very closely and with some concerns about impulsivity.  He denies any concerns and feels will be ready next week.  [place], LCSW

## 2016-01-27 NOTE — Patient Care Conference (Signed)
Inpatient RehabilitationTeam Conference and Plan of Care Update Date: 01/26/2016   Time: 2:10 PM    Patient Name: [name] [name].      Medical Record Number: [id number]  Date of Birth: [birth year] Sex: Male         Room/Bed: 4W02C/4W02C-01 Payor Info: Payor: [employer] / Plan: [employer] MANAGED / Product Type: *No Product type* /    Admitting Diagnosis: CVA  Admit Date/Time:  01/20/2016  4:56 PM Admission Comments: No comment available   Primary Diagnosis:  <principal problem not specified> Principal Problem: <principal problem not specified>  Patient Active Problem List   Diagnosis Date Noted  . Diabetes mellitus type 2 in obese ([place])   . Obesity   . Right foot drop   . Other vascular headache   . 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 Benign essential HTN   . Cerebral thrombosis with cerebral infarction 01/17/2016  . Type 2 diabetes mellitus with hyperglycemia, without long-term current use of insulin ([place]) 01/17/2016  . Stroke ([place]) 01/17/2016  . Cerebral infarction due to unspecified mechanism 01/15/2016  . Cerebrovascular accident (CVA) due to thrombosis of basilar artery ([place])   . HLD (hyperlipidemia)   . Tobacco use disorder   . Stroke-like symptoms 01/14/2016  . Acute right-sided weakness 01/14/2016  . AKI (acute kidney injury) ([place]) 01/14/2016  . Accelerated hypertension 01/14/2016  . Right hemiparesis ([place]) 01/14/2016    Expected Discharge Date: Expected Discharge Date: 02/03/16  Team Members Present: Physician leading conference: Dr. Delice Lesch Social Worker Present: Lennart Pall, LCSW Nurse Present: Rayetta Pigg, RN PT Present: Barrie Folk, PT OT Present: Other (comment) Grayland Ormond Doe, OT) SLP Present: Weston Anna, SLP PPS Coordinator present : Daiva Nakayama, RN, CRRN     Current Status/Progress Goal Weekly Team Focus  Medical   Right hemiplegia with dysarthria secondary to left pontine infarct with evolution  Improve safety, transfers, mobility, HTN, DM   See above   Bowel/Bladder   continent of bowel and bladder  min assist  continue to monitor q shift   Swallow/Nutrition/ Hydration             ADL's   Supervision w/ intermittent steadying A for functional shower transfers, supervision for bathing w/ min cues for functional use of R UE. supervision UB dressing, Min  A LB dressing  Mod I bathing, dressing, toileting, and functional transfers  R UE strengthening/fine and gross motor coordination, functional use of R UE, standing balance during ADLs   Mobility   supervision for ambulation with R AFO, steady A/supervision for stair negotiation with at least 1 rail, 1/5 R ankle dorsiflexion, supervision for standing balance  mod I bed mobility & transfers, mod I ambulation, mod I standing balance, supervision x 12 steps with 1 rail  RLE hip flexor strengthening, neuro re-ed, balance, gait training, orthotic consult   Communication   updated from 8/15: mod I, goals met, discharged from [facility] 8/16  mod I   D/C from SLP today   Safety/Cognition/ Behavioral Observations  no unsafe behavior  min assist  continue to monitor q shift   Pain   no complaints of pain  pain less than 3  continue to monitor q shift   Skin   no skin breakdown  no skin breakdown this admission  continue to monitor q shift    Rehab Goals Patient on target to meet rehab goals: Yes *See Care Plan and progress notes for long and short-term goals.  Barriers to Discharge: HTN, DM, safety,  mobility, transfers    Possible Resolutions to Barriers:  Adjust HTN and DM meds, therapies    Discharge Planning/Teaching Needs:  Planning for d/c home with wife who works days and 8 yo daughter   -hoping to meet mod ind goals      Team Discussion:  MD making adjustments to DM and BP meds.  Currently min assist overall with PT/OT and with mod independent goals (except supervision with stairs).  ST has d/c'd and believe his impulsivity is likely baseline.  Revisions to Treatment Plan:   None   Continued Need for Acute Rehabilitation Level of Care: The patient requires daily medical management by a physician with specialized training in physical medicine and rehabilitation for the following conditions: Daily direction of a multidisciplinary physical rehabilitation program to ensure safe treatment while eliciting the highest outcome that is of practical value to the patient.: Yes Daily medical management of patient stability for increased activity during participation in an intensive rehabilitation regime.: Yes Daily analysis of laboratory values and/or radiology reports with any subsequent need for medication adjustment of medical intervention for : Neurological problems;Diabetes problems;Blood pressure problems  Mayci Haning 01/27/2016, 12:43 PM

## 2016-01-27 NOTE — Progress Notes (Signed)
Nutrition Brief Note  RD contacted via RN regarding pt requesting HS snacks and diet/food choices. Family present at pt bedside during time of visit. Family familiar with carbohydrate modified diet and has been encouraging pt to follow/comply with it. Discussed diabetic friendly food choices offered on the menu. RD to order HS snack per pt request and food preferences. Current meal completion has been 100%. Pt with intentional weight loss. Labs and medications reviewed. No further nutrition interventions at this time.  Roslyn SmilingStephanie Elanora Quin, MS, RD, LDN Pager # [phone] After hours/ weekend pager # [phone]

## 2016-01-27 NOTE — Progress Notes (Signed)
[place] PHYSICAL MEDICINE & REHABILITATION     PROGRESS NOTE  Subjective/Complaints:  Pt sitting up in his wheelchair using his laptop.  He is happy about the fact that he had some spasms in his leg yesterday.  He also states his PRAFO is not fitting well.   ROS: Denies CP, SOB, N/V/D.  Objective: Vital Signs: Blood pressure (!) 153/94, pulse 80, temperature 98.2 F (36.8 C), temperature source Oral, resp. rate 18, height 5\' 11"  (1.803 m), weight 110.9 kg (244 lb 9.6 oz), SpO2 98 %. No results found.  Recent Labs  01/27/16 0624  WBC 8.1  HGB 14.3  HCT 43.7  PLT 241    Recent Labs  01/27/16 0624  NA 139  K 3.8  CL 101  GLUCOSE 127*  BUN 17  CREATININE 0.97  CALCIUM 9.3   CBG (last 3)   Recent Labs  01/26/16 1639 01/26/16 2121 01/27/16 0642  GLUCAP 145* 109* 134*    Wt Readings from Last 3 Encounters:  01/27/16 110.9 kg (244 lb 9.6 oz)  01/17/16 117.9 kg (260 lb)  01/15/16 117.9 kg (260 lb)    Physical Exam:  BP (!) 153/94 (BP Location: Right Leg)   Pulse 80   Temp 98.2 F (36.8 C) (Oral)   Resp 18   Ht 5\' 11"  (1.803 m) Comment: stated  Wt 110.9 kg (244 lb 9.6 oz)   SpO2 98%   BMI 34.11 kg/m   Constitutional: He appears well-developedand well-nourished. NAD. HENT: Normocephalicand atraumatic.  Eyes: Conjunctivaeand EOMare normal.  Cardiovascular: Normal rateand regular rhythm.  Respiratory: Effort normaland breath sounds normal. No respiratory distress.  GI: Soft. Bowel sounds are normal. He exhibits no distension.  Musculoskeletal: He exhibits no edemaor tenderness.  Neurological: He is alertand oriented.  Dysarthria.  Good awareness of deficits Right hemifacial wekaness Motor: RUE: shoulder abduction, elbow flexion/extension 4/5, wrist extension, hand grip 4-/5 RLE: Hip flexion 3+/5, knee extension 4+/5, ankle dorsi/plantar flexion 2/5, wiggles toes LUE/LLE: 5/5 proximal to distal Skin: Skin is warmand dry.  Psychiatric: He has  a normal mood and affect. His behavior is normal. Thought contentnormal  Assessment/Plan: 1. 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 Physiatrist is providing close team supervision and 24 hour management of active medical problems listed below. Physiatrist and rehab team continue to assess barriers to discharge/monitor patient progress toward functional and medical goals.  Function:  Bathing Bathing position   Position: (P) Shower  Bathing parts Body parts bathed by patient: Right arm, Left arm, Chest, Abdomen, Front perineal area, Buttocks, Right upper leg, Left upper leg, Right lower leg, Left lower leg Body parts bathed by helper: Back  Bathing assist Assist Level: Supervision or verbal cues   Set up : To obtain items  Upper Body Dressing/Undressing Upper body dressing   What is the patient wearing?: Pull over shirt/dress     Pull over shirt/dress - Perfomed by patient: Thread/unthread right sleeve, Thread/unthread left sleeve, Put head through opening, Pull shirt over trunk          Upper body assist Assist Level: More than reasonable time   Set up : To obtain clothing/put away  Lower Body Dressing/Undressing Lower body dressing   What is the patient wearing?: Pants, Underwear, Socks, Shoes, AFO Underwear - Performed by patient: Thread/unthread right underwear leg, Thread/unthread left underwear leg, Pull underwear up/down Underwear -  Performed by helper: Pull underwear up/down, Thread/unthread right underwear leg Pants- Performed by patient: Thread/unthread left pants leg, Pull pants up/down, Thread/unthread right pants leg Pants- Performed by helper: Thread/unthread right pants leg, Pull pants up/down     Socks - Performed by patient: Don/doff right sock, Don/doff left sock Socks - Performed by helper:  Don/doff left sock, Don/doff right sock Shoes - Performed by patient: Don/doff right shoe, Don/doff left shoe Shoes - Performed by helper: Fasten right, Fasten left   AFO - Performed by helper: Don/doff right AFO      Lower body assist Assist for lower body dressing: Touching or steadying assistance (Pt > 75%)      Toileting Toileting   Toileting steps completed by patient: Adjust clothing prior to toileting, Performs perineal hygiene, Adjust clothing after toileting   Toileting Assistive Devices: Grab bar or rail  Toileting assist Assist level: Supervision or verbal cues   Transfers Chair/bed transfer   Chair/bed transfer method: Ambulatory Chair/bed transfer assist level: Supervision or verbal cues Chair/bed transfer assistive device: Orthosis     Locomotion Ambulation     Max distance: 200 ft Assist level: Supervision or verbal cues   Wheelchair   Type: Manual Max wheelchair distance: 12650ft Assist Level: Supervision or verbal cues  Cognition Comprehension Comprehension assist level: Follows complex conversation/direction with no assist  Expression Expression assist level: Expresses complex ideas: With extra time/assistive device  Social Interaction Social Interaction assist level: Interacts appropriately with others - No medications needed.  Problem Solving Problem solving assist level: Solves complex problems: Recognizes & self-corrects  Memory Memory assist level: Complete Independence: No helper    Medical Problem List and Plan: 1. 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  Cont CIR  Bracing  Fluoxetine started on 8/16 2. DVT Prophylaxis/Anticoagulation: Subcutaneous Lovenox. Monitor platelet counts of any signs of bleeding 3. Pain Management: Tylenol as needed 4. Hypertension.   HCTZ 12.5 mg daily, changed to 25 mg on 8/16, changed to chlorthalidone 25 on 8/17, increased to  50 on 8/18.   Monitor with increased mobility  Lisinopril 20 mg daily increased to 40 on 8/14  Will cont to monitor 5. Neuropsych: This patient iscapable of making decisions on hisown behalf. 6. Skin/Wound Care: Routine skin checks 7. Fluids/Electrolytes/Nutrition: Routine I&O's  8. Newly diagnosed diabetes mellitus. Hemoglobin A1c 8.5.   Check blood sugars before meals and at bedtime.   Provide diabetic teaching.  Metformin started 8/11, increased to BID on 8/14, increased to 850mg  BID on 8/16  Will cont to monitor, improving 9.?Chronic renal insufficiency, likely AKI as Cr has not stabalized  BMP WNL on 8/17 10.Hyperlipidemia. Lipitor 11.Tobacco abuse. Nicoderm patch. Provide counseling 12. Obesity:   Body mass index is 34.11 kg/m.  Diet and exercise education  Will cont to encourage weight loss to increase endurance and promote overall health   LOS (Days) 7 A FACE TO FACE EVALUATION WAS PERFORMED  Jacyln Carmer [name] Tayanna Talford 01/27/2016 8:54 AM

## 2016-01-28 ENCOUNTER — Inpatient Hospital Stay (HOSPITAL_COMMUNITY): Payer: Managed Care, Other (non HMO) | Admitting: Physical Therapy

## 2016-01-28 ENCOUNTER — Inpatient Hospital Stay (HOSPITAL_COMMUNITY): Payer: Managed Care, Other (non HMO) | Admitting: Occupational Therapy

## 2016-01-28 LAB — GLUCOSE, CAPILLARY
GLUCOSE-CAPILLARY: 126 mg/dL — AB (ref 65–99)
Glucose-Capillary: 101 mg/dL — ABNORMAL HIGH (ref 65–99)
Glucose-Capillary: 104 mg/dL — ABNORMAL HIGH (ref 65–99)
Glucose-Capillary: 141 mg/dL — ABNORMAL HIGH (ref 65–99)

## 2016-01-28 NOTE — Progress Notes (Signed)
Physical Therapy Weekly Progress Note  Patient Details  Name: [name] [name]. MRN: [id number] Date of Birth: 12/22/1965  Beginning of progress report period: January 21, 2016 End of progress report period: January 28, 2016  Today's Date: 01/28/2016 PT Individual Time:  -   [id number] and 1351-1450  Calculated individual PT time: 60 min and 59 min        Patient has met 4 of 4 short term goals.    Patient continues to demonstrate the following deficits: balance, strength, neuromotor control, and therefore will continue to benefit from skilled PT intervention to enhance overall performance with balance, postural control, ability to compensate for deficits, functional use of  right upper extremity and right lower extremity, awareness and coordination.  Patient progressing toward long term goals..  Continue plan of care.  PT Short Term Goals Week 1:  PT Short Term Goal 1 (Week 1): Patient will consistently demonstrate safet transfers with supervision A and LRAD.  PT Short Term Goal 1 - Progress (Week 1): Met PT Short Term Goal 2 (Week 1): Patient will ambulate >16f with close supervision A and LRAD.  PT Short Term Goal 2 - Progress (Week 1): Met PT Short Term Goal 3 (Week 1): Patient will perform bed mobillity with supervision A.  PT Short Term Goal 3 - Progress (Week 1): Met PT Short Term Goal 4 (Week 1): Patient will ascend and descend 12 steps with 1 UE support and min A.  PT Short Term Goal 4 - Progress (Week 1): Met Week 2:  PT Short Term Goal 1 (Week 2): LTG = STG due to ELOS.    Skilled Therapeutic Interventions/Progress Updates:   [facility]and agreeable to PT. PT instructed patient in Gait training for 2010fx 2 and 25057fwith R LE AFO PT provided supervision A for all gait training in controlled environment. PT instructed patient in stair training up/down 12 steps without UE support with min A from PT for safety and min cues for step-to gait pattern. Pt  also instructed in up/down 16 steps with 1 UE support and step through gait pattern; mild decreased knee control on that RLE. Floor transfer training instructed by PT with min A. Mod verbal cues for LE and UE positioning as well as proper ues of LLE. Speed ladder training for dynamic balance and forced NMR; one foot in each rung x 8, side stepping L and R x 6 each direction.  Supine therex SLR x 8 BLE, Single leg bridges x 10 BLE with 3 second hold. Patient educated on the need for assistance from staff member for ambulation in room to prevent fall.  Patient left in room sitting with call bell in reach.   Session  2:  Patient received sitting in WC and agreeable to PT. PT instructed patient training to coffee shop for [id number] with supervision A and min cues for improved hip and knee flexion to prevent foot drag of RLE. Patient reports that he wants to minimize knee flexion and improved heel strike to allow for "normal walking." PT educated patient on improved neuromotor control of targeted muscles including HS and hip flexors with exaggerated hip flexion with gait, as well as improved foot clearance to prevent fall in community.     PT applied NMES small muscle atrophy to anterior tibialis, 35 pps and 20 sec off time x 15 min intensity 37.   Following NMES PT instructed patient in Neuro re-ed with forced use of RLE.  NMR:  Ankle DF x 15 R LE.  Ankle eversion from inverted  Position x 15 R LE  Ankle inversion from everted position x 15 RLE.  For all motions, PT provided tactile stimuli to improve activation and noted improvement of DF against gravity.   PT educated patient in importance of assistance for ambulation in room for safety at this time, and educated patient on need for improved consistency with safe gait pattern before attempting to ambulate without supervision. Patient verbalized understanding at this time.    Patient returned to room and left sitting in [facility] with call bell in reach.       Therapy Documentation Precautions:  Precautions Precautions: Fall Restrictions Weight Bearing Restrictions: No   Pain: 0/10    Mobility: Bed Mobility Bed Mobility: Rolling Left;Rolling Right;Supine to Sit;Sit to Supine Rolling Right: 5: Supervision Rolling Left: 6: Modified independent (Device/Increase time) Right Sidelying to Sit: 6: Modified independent (Device/Increase time) Supine to Sit: 5: Supervision Sitting - Scoot to Edge of Bed: 5: Supervision Transfers Transfers: Yes Sit to Stand: 5: Supervision Sit to Stand Details: Verbal cues for precautions/safety Stand to Sit: 5: Supervision Stand Pivot Transfers: 5: Supervision Locomotion : Ambulation Ambulation: Yes Ambulation/Gait Assistance: 5: Supervision Ambulation Distance (Feet): 250 Feet Assistive device: Other (Comment) (R AFO ) Ambulation/Gait Assistance Details: Verbal cues for gait pattern;Verbal cues for precautions/safety Gait Gait: Yes Gait Pattern: Impaired Gait Pattern: Decreased step length - right;Right circumduction Stairs / Additional Locomotion Stairs: Yes Stairs Assistance: 4: Min assist Stairs Assistance Details: Verbal cues for gait pattern;Verbal cues for precautions/safety Stair Management Technique: No rails Number of Stairs: 12 Height of Stairs: 6 Ramp: 5: Supervision  Trunk/Postural Assessment : Cervical Assessment Cervical Assessment: Within Functional Limits Thoracic Assessment Thoracic Assessment: Within Functional Limits Lumbar Assessment Lumbar Assessment: Exceptions to [facility] Postural Control Postural Control: Deficits on evaluation  Balance:   Supervision A for dynamic balance with functional movement.    See Function Navigator for Current Functional Status.  Therapy/Group: Individual Therapy  Lorie Phenix 01/28/2016, 10:54 AM

## 2016-01-28 NOTE — Progress Notes (Signed)
Occupational Therapy Session Note  Patient Details  Name: [name] RalphHerman Hinkson Jr. MRN: [id number] Date of Birth: Feb 27, 1966  Today's Date: 01/28/2016   Session 1: OT Individual Time: [id number] OT Individual Time Calculation (min): 59 min   Session 2: OT Individual Time: 1300-1330 OT Individual Time Calculation (min): 30 min    Short Term Goals: Week 2:  OT Short Term Goal 1 (Week 2): Pt will don/doff AFO using adaptive equipmet as needed with supervision  OT Short Term Goal 2 (Week 2): Pt will maintain dynamic standing balance while reaching outside base of support with 25% supervision OT Short Term Goal 3 (Week 2): Pt will participate in R hand fine motor excercises daily without cues  Skilled Therapeutic Interventions/Progress Updates:    Session 1: OT treatment focused on functional use of R UE , ADL participation, dynamic standing balance, and neuromuscular re-education strategies. Pt completed functional mobility, shower transfers, and bathing tasks with supervision for safety. Pt demonstrated improved functional use of R UE as he was able to reach above head to grasp shampoo bottle with R hand and squeeze shampoo out of bottle with R hand today. Pt required min cues to facilitate normal movement patterns during these functional tasks.   Session 2: OT treatment focused on fine motor coordination, hand translation and object manipulation, and functional grasp utilizing peg board. Pt's pincer grasp is improving but he continues to require cues to slow down, think through the movement pattern he is trying to achieve to help promote improved distal control.   Therapy Documentation Precautions:  Precautions Precautions: Fall Restrictions Weight Bearing Restrictions: No  Pain: Pain Assessment Pain Assessment: No/denies pain ADL:  See Function Navigator for Current Functional Status.   Therapy/Group: Individual Therapy  Mal Amabilelisabeth S Shaft Corigliano 01/28/2016, 4:11 PM

## 2016-01-28 NOTE — Progress Notes (Signed)
[facility] PHYSICAL MEDICINE & REHABILITATION     PROGRESS NOTE  Subjective/Complaints:  Pt sitting up in his wheelchair.  He states he wants a sandwich and he wants to be able to move more in his room.    ROS: Denies CP, SOB, N/V/D.  Objective: Vital Signs: Blood pressure (!) 141/77, pulse 75, temperature 98.2 F (36.8 C), temperature source Oral, resp. rate 16, height 5\' 11"  (1.803 m), weight 110.9 kg (244 lb 9.6 oz), SpO2 99 %. No results found.  Recent Labs  01/27/16 0624  WBC 8.1  HGB 14.3  HCT 43.7  PLT 241    Recent Labs  01/27/16 0624  NA 139  K 3.8  CL 101  GLUCOSE 127*  BUN 17  CREATININE 0.97  CALCIUM 9.3   CBG (last 3)   Recent Labs  01/27/16 1632 01/27/16 2156 01/28/16 0640  GLUCAP 103* 120* 126*    Wt Readings from Last 3 Encounters:  01/27/16 110.9 kg (244 lb 9.6 oz)  01/17/16 117.9 kg (260 lb)  01/15/16 117.9 kg (260 lb)    Physical Exam:  BP (!) 141/77   Pulse 75   Temp 98.2 F (36.8 C) (Oral)   Resp 16   Ht 5\' 11"  (1.803 m) Comment: stated  Wt 110.9 kg (244 lb 9.6 oz)   SpO2 99%   BMI 34.11 kg/m   Constitutional: He appears well-developedand well-nourished. NAD. HENT: Normocephalicand atraumatic.  Eyes: Conjunctivaeand EOMare normal.  Cardiovascular: Normal rateand regular rhythm.  Respiratory: Effort normaland breath sounds normal. No respiratory distress.  GI: Soft. Bowel sounds are normal. He exhibits no distension.  Musculoskeletal: He exhibits no edemaor tenderness.  Neurological: He is alertand oriented.  Dysarthria.  Good awareness of deficits Right hemifacial weakness Motor: RUE: shoulder abduction, elbow flexion/extension 4+/5, wrist extension, hand grip 4/5 RLE: Hip flexion 4/5, knee extension 4+/5, ankle dorsi/plantar flexion 2+/5, wiggles toes LUE/LLE: 5/5 proximal to distal Skin: Skin is warmand dry.  Psychiatric: He has a normal mood and affect. His behavior is normal. Thought  contentnormal  Assessment/Plan: 1. 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 Physiatrist is providing close team supervision and 24 hour management of active medical problems listed below. Physiatrist and rehab team continue to assess barriers to discharge/monitor patient progress toward functional and medical goals.  Function:  Bathing Bathing position   Position: Shower  Bathing parts Body parts bathed by patient: Right arm, Left arm, Chest, Abdomen, Front perineal area, Buttocks, Right upper leg, Left upper leg, Right lower leg, Left lower leg Body parts bathed by helper: Back  Bathing assist Assist Level: Supervision or verbal cues   Set up : To obtain items  Upper Body Dressing/Undressing Upper body dressing   What is the patient wearing?: Pull over shirt/dress     Pull over shirt/dress - Perfomed by patient: Thread/unthread right sleeve, Thread/unthread left sleeve, Put head through opening, Pull shirt over trunk          Upper body assist Assist Level: More than reasonable time   Set up : To obtain clothing/put away  Lower Body Dressing/Undressing Lower body dressing   What is the patient wearing?: Pants, AFO, Shoes, Underwear Underwear - Performed by patient: Thread/unthread right underwear leg, Thread/unthread left underwear leg, Pull underwear up/down Underwear - Performed by helper: Pull underwear up/down, Thread/unthread right underwear leg Pants- Performed by patient: Thread/unthread  left pants leg, Pull pants up/down, Thread/unthread right pants leg Pants- Performed by helper: Thread/unthread right pants leg, Pull pants up/down     Socks - Performed by patient: Don/doff right sock, Don/doff left sock Socks - Performed by helper: Don/doff left sock, Don/doff right sock Shoes - Performed by patient:  Don/doff left shoe, Fasten left Shoes - Performed by helper: Don/doff right shoe, Fasten right   AFO - Performed by helper: Don/doff right AFO      Lower body assist Assist for lower body dressing: Touching or steadying assistance (Pt > 75%)      Toileting Toileting   Toileting steps completed by patient: Adjust clothing prior to toileting, Performs perineal hygiene, Adjust clothing after toileting   Toileting Assistive Devices: Grab bar or rail  Toileting assist Assist level: Supervision or verbal cues   Transfers Chair/bed transfer   Chair/bed transfer method: Ambulatory Chair/bed transfer assist level: Supervision or verbal cues Chair/bed transfer assistive device: Orthosis     Locomotion Ambulation     Max distance: 500 Assist level: Touching or steadying assistance (Pt > 75%)   Wheelchair   Type: Manual Max wheelchair distance: 13150ft Assist Level: Supervision or verbal cues  Cognition Comprehension Comprehension assist level: Follows complex conversation/direction with no assist  Expression Expression assist level: Expresses complex ideas: With no assist  Social Interaction Social Interaction assist level: Interacts appropriately with others - No medications needed.  Problem Solving Problem solving assist level: Solves complex problems: Recognizes & self-corrects  Memory Memory assist level: Complete Independence: No helper    Medical Problem List and Plan: 1. 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  Cont CIR  Bracing  Fluoxetine started on 8/16 2. DVT Prophylaxis/Anticoagulation: Subcutaneous Lovenox. Monitor platelet counts of any signs of bleeding 3. Pain Management: Tylenol as needed 4. Hypertension.   HCTZ 12.5 mg daily, changed to 25 mg on 8/16, changed to chlorthalidone 25 on 8/17, increased to 50 on 8/18.   Monitor with increased mobility  Lisinopril 20 mg daily  increased to 40 on 8/14  Improving, Will cont to monitor 5. Neuropsych: This patient iscapable of making decisions on hisown behalf. 6. Skin/Wound Care: Routine skin checks 7. Fluids/Electrolytes/Nutrition: Routine I&O's  8. Newly diagnosed diabetes mellitus. Hemoglobin A1c 8.5.   Check blood sugars before meals and at bedtime.   Provide diabetic teaching.  Metformin started 8/11, increased to BID on 8/14, increased to 850mg  BID on 8/16  Will cont to monitor, improving 9.?Chronic renal insufficiency, likely AKI as Cr has not stabalized  BMP WNL on 8/17 10.Hyperlipidemia. Lipitor 11.Tobacco abuse. Nicoderm patch. Provide counseling 12. Obesity:   Body mass index is 34.11 kg/m.  Diet and exercise education  Will cont to encourage weight loss to increase endurance and promote overall health   LOS (Days) 8 A FACE TO FACE EVALUATION WAS PERFORMED  [name] 01/28/2016 8:19 AM

## 2016-01-29 ENCOUNTER — Inpatient Hospital Stay (HOSPITAL_COMMUNITY): Payer: Managed Care, Other (non HMO) | Admitting: Occupational Therapy

## 2016-01-29 LAB — GLUCOSE, CAPILLARY
GLUCOSE-CAPILLARY: 118 mg/dL — AB (ref 65–99)
GLUCOSE-CAPILLARY: 115 mg/dL — AB (ref 65–99)
Glucose-Capillary: 152 mg/dL — ABNORMAL HIGH (ref 65–99)
Glucose-Capillary: 90 mg/dL (ref 65–99)

## 2016-01-29 NOTE — Progress Notes (Signed)
Occupational Therapy Session Note  Patient Details  Name: [name] [name]. MRN: [id number] Date of Birth: 1965/08/11  Today's Date: 01/29/2016 OT Individual Time: 0901-1000 OT Individual Time Calculation (min): 59 min     Short Term Goals: Week 1:  OT Short Term Goal 1 (Week 1): Pt will complete lower body dressing with minimal assistance OT Short Term Goal 1 - Progress (Week 1): Met OT Short Term Goal 2 (Week 1): Pt will initiate R hand with min questioning cueing in his BADLs OT Short Term Goal 2 - Progress (Week 1): Met OT Short Term Goal 3 (Week 1): Pt will be issued HEP for R UE  OT Short Term Goal 3 - Progress (Week 1): Met OT Short Term Goal 4 (Week 1): Pt will maintain dynamic standing balance with 50% supervision OT Short Term Goal 4 - Progress (Week 1): Met Week 2:  OT Short Term Goal 1 (Week 2): Pt will don/doff AFO using adaptive equipmet as needed with supervision  OT Short Term Goal 2 (Week 2): Pt will maintain dynamic standing balance while reaching outside base of support with 25% supervision OT Short Term Goal 3 (Week 2): Pt will participate in R hand fine motor excercises daily without cues Week 3:     Skilled Therapeutic Interventions/Progress Updates:   Pt participated in skilled OT tx focusing on ADL retraining in simulated home environment. Pt reported planning to stand in shower at home, and therefore completed showering while standing today without use of grab bars per home setup. Threshold created per home shower. Pt completed transfer with close supervision for clearance of R LE over threshold. Pt demonstrated good standing balance while reaching lower legs and integrated R UE into bathing approximately 50% of time including squeezing out shampoo. Afterwards, pt completed dressing at EOB with overall supervision for using R UE. Pt able to tie shoes with extra time. R LE orthosis donned with supervision. Pt and daughter then ambulated with skilled OT down to  cafeteria with pt instructed on incorporating R UE into simple preparation of coffee. Pt manipulated money including coins with R hand to improve finger/palm and palm/finger translation. Pt returned to room and completed yellow theraputty activities while standing with emphasis on strengthening R UE extensor muscles. R UE function has improved since time of eval. At end of session, pt was left in w/c with all needs within reach and daughter present.   Therapy Documentation Precautions:  Precautions Precautions: Fall Restrictions Weight Bearing Restrictions: No General:   Vital Signs:   Pain: No c/o pain  Pain Assessment Pain Score: 0-No pain ADL: ADL Grooming: Supervision/safety Where Assessed-Grooming: Standing at sink Upper Body Bathing: Supervision/safety Where Assessed-Upper Body Bathing: Shower Lower Body Bathing: Supervision/safety Where Assessed-Lower Body Bathing: Shower Upper Body Dressing: Supervision/safety Where Assessed-Upper Body Dressing: Sitting at sink Lower Body Dressing: Minimal assistance Where Assessed-Lower Body Dressing: Sitting at sink    See Function Navigator for Current Functional Status.   Therapy/Group: Individual Therapy  [name] [name] [name] [name] 01/29/2016, 12:46 PM

## 2016-01-29 NOTE — Plan of Care (Signed)
Problem: RH PAIN MANAGEMENT Goal: RH STG PAIN MANAGED AT OR BELOW PT'S PAIN GOAL Less than 3 Outcome: Progressing No c/o pain     

## 2016-01-29 NOTE — Progress Notes (Signed)
[place] PH[facility] MEDICINE & REHABILITATION     PROGRESS NOTE  Subjective/Complaints:  Standing working with OT. No issues overnight. No shoulder pains.  ROS: Denies CP, SOB, N/V/D.  Objective: Vital Signs: Blood pressure 126/71, pulse 61, temperature 97.7 F (36.5 C), temperature source Oral, resp. rate 18, height 5\' 11"  (1.803 m), weight 110.9 kg (244 lb 9.6 oz), SpO2 100 %. No results found.  Recent Labs  01/27/16 0624  WBC 8.1  HGB 14.3  HCT 43.7  PLT 241    Recent Labs  01/27/16 0624  NA 139  K 3.8  CL 101  GLUCOSE 127*  BUN 17  CREATININE 0.97  CALCIUM 9.3   CBG (last 3)   Recent Labs  01/28/16 2125 01/29/16 0643 01/29/16 1140  GLUCAP 141* 118* 115*    Wt Readings from Last 3 Encounters:  01/27/16 110.9 kg (244 lb 9.6 oz)  01/17/16 117.9 kg (260 lb)  01/15/16 117.9 kg (260 lb)    Physical Exam:  BP 126/71 (BP Location: Right Arm)   Pulse 61   Temp 97.7 F (36.5 C) (Oral)   Resp 18   Ht 5\' 11"  (1.803 m) Comment: stated  Wt 110.9 kg (244 lb 9.6 oz)   SpO2 100%   BMI 34.11 kg/m   Constitutional: He appears well-developedand well-nourished. NAD. HENT: Normocephalicand atraumatic.  Eyes: Conjunctivaeand EOMare normal.  Cardiovascular: Normal rateand regular rhythm.  Respiratory: Effort normaland breath sounds normal. No respiratory distress.  GI: Soft. Bowel sounds are normal. He exhibits no distension.  Musculoskeletal: He exhibits no edemaor tenderness.  Neurological: He is alertand oriented.  Dysarthria.  Good awareness of deficits Right hemifacial weakness Motor: RUE: shoulder abduction, elbow flexion/extension 4+/5, wrist extension, hand grip 4/5 RLE: Hip flexion 4/5, knee extension 4+/5, ankle dorsi/plantar flexion 2+/5, wiggles toes LUE/LLE: 5/5 proximal to distal Skin: Skin is warmand dry.  Psychiatric: He has a normal mood and affect. His behavior is normal. Thought contentnormal  Assessment/Plan: 1. 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 Physiatrist is providing close team supervision and 24 hour management of active medical problems listed below. Physiatrist and rehab team continue to assess barriers to discharge/monitor patient progress toward functional and medical goals.  Function:  Bathing Bathing position   Position: Shower  Bathing parts Body parts bathed by patient: Right arm, Left arm, Chest, Abdomen, Front perineal area, Buttocks, Right upper leg, Left upper leg, Right lower leg, Left lower leg Body parts bathed by helper: Back  Bathing assist Assist Level: Supervision or verbal cues   Set up : To obtain items  Upper Body Dressing/Undressing Upper body dressing   What is the patient wearing?: Pull over shirt/dress     Pull over shirt/dress - Perfomed by patient: Thread/unthread right sleeve, Thread/unthread left sleeve, Put head through opening, Pull shirt over trunk          Upper body assist Assist Level: More than reasonable time   Set up : To obtain clothing/put away  Lower Body Dressing/Undressing Lower body dressing   What is the patient wearing?: Pants, AFO, Shoes, Underwear Underwear - Performed by patient: Thread/unthread right underwear leg, Thread/unthread left underwear leg, Pull underwear up/down Underwear - Performed by helper: Pull underwear up/down, Thread/unthread right underwear leg Pants- Performed by patient: Thread/unthread left pants leg, Pull pants up/down, Thread/unthread right pants leg Pants- Performed by helper: Thread/unthread  right pants leg, Pull pants up/down     Socks - Performed by patient: Don/doff right sock, Don/doff left sock Socks - Performed by helper: Don/doff left sock, Don/doff right sock Shoes - Performed by patient: Don/doff left shoe, Fasten left Shoes - Performed  by helper: Don/doff right shoe, Fasten right   AFO - Performed by helper: Don/doff right AFO      Lower body assist Assist for lower body dressing: Touching or steadying assistance (Pt > 75%)      Toileting Toileting   Toileting steps completed by patient: Adjust clothing prior to toileting, Performs perineal hygiene, Adjust clothing after toileting   Toileting Assistive Devices: Grab bar or rail  Toileting assist Assist level: Supervision or verbal cues   Transfers Chair/bed transfer   Chair/bed transfer method: Ambulatory Chair/bed transfer assist level: Supervision or verbal cues Chair/bed transfer assistive device: Armrests, Orthosis     Locomotion Ambulation     Max distance: 66700ft Assist level: Supervision or verbal cues   Wheelchair   Type: Manual Max wheelchair distance: 11350ft Assist Level: Supervision or verbal cues  Cognition Comprehension Comprehension assist level: Follows complex conversation/direction with no assist  Expression Expression assist level: Expresses complex ideas: With no assist  Social Interaction Social Interaction assist level: Interacts appropriately with others - No medications needed.  Problem Solving Problem solving assist level: Solves complex problems: Recognizes & self-corrects  Memory Memory assist level: Complete Independence: No helper    Medical Problem List and Plan: 1. 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  Cont CIR  Bracing  Fluoxetine started on 8/16 2. DVT Prophylaxis/Anticoagulation: Subcutaneous Lovenox. Monitor platelet counts of any signs of bleeding 3. Pain Management: Tylenol as needed 4. Hypertension.  Vitals:   01/28/16 1457 01/29/16 0502  BP: (!) 142/92 126/71  Pulse: 85 61  Resp: 18 18  Temp: 98.7 F (37.1 C) 97.7 F (36.5 C)    HCTZ 12.5 mg daily, changed to 25 mg on 8/16, changed to chlorthalidone 25 on 8/17,  increased to 50 on 8/18.   Monitor with increased mobility  Lisinopril 20 mg daily increased to 40 on 8/14  Improving, Will cont to monitor 5. Neuropsych: This patient iscapable of making decisions on hisown behalf. 6. Skin/Wound Care: Routine skin checks 7. Fluids/Electrolytes/Nutrition: Routine I&O's  8. Newly diagnosed diabetes mellitus. Hemoglobin A1c 8.5.   Check blood sugars before meals and at bedtime.   Provide diabetic teaching.  Metformin started 8/11, increased to BID on 8/14, increased to 850mg  BID on 8/16  Will cont to monitor, improving 9.?Chronic renal insufficiency, likely AKI as Cr has not stabalized  BMP WNL on 8/17 10.Hyperlipidemia. Lipitor 11.Tobacco abuse. Nicoderm patch. Provide counseling 12. Obesity:   Body mass index is 34.11 kg/m.  Diet and exercise education  Will cont to encourage weight loss to increase endurance and promote overall health   LOS (Days) 9 A FACE TO FACE EVALUATION WAS PERFORMED  Claudette LawsKIRSTEINS,Aslynn Brunetti E 01/29/2016 11:52 AM

## 2016-01-30 ENCOUNTER — Inpatient Hospital Stay (HOSPITAL_COMMUNITY): Payer: Managed Care, Other (non HMO) | Admitting: Occupational Therapy

## 2016-01-30 LAB — GLUCOSE, CAPILLARY
GLUCOSE-CAPILLARY: 128 mg/dL — AB (ref 65–99)
GLUCOSE-CAPILLARY: 139 mg/dL — AB (ref 65–99)
GLUCOSE-CAPILLARY: 118 mg/dL — AB (ref 65–99)
Glucose-Capillary: 143 mg/dL — ABNORMAL HIGH (ref 65–99)

## 2016-01-30 MED ORDER — CHLORTHALIDONE 25 MG PO TABS
25.0000 mg | ORAL_TABLET | Freq: Every day | ORAL | Status: DC
  Administered 2016-01-31 – 2016-02-02 (×3): 25 mg via ORAL
  Filled 2016-01-30 (×3): qty 1

## 2016-01-30 NOTE — Progress Notes (Signed)
[place] [facility] MEDICINE & REHABILITATION     PROGRESS NOTE  Subjective/Complaints:  Standing working with OT. No issues overnight. No shoulder pains.  ROS: Denies CP, SOB, N/V/D.  Objective: Vital Signs: Blood pressure 115/77, pulse 76, temperature 98 F (36.7 C), temperature source Oral, resp. rate 18, height 5\' 11"  (1.803 m), weight 110.9 kg (244 lb 9.6 oz), SpO2 97 %. No results found. No results for input(s): WBC, HGB, HCT, PLT in the last 72 hours. No results for input(s): NA, K, CL, GLUCOSE, BUN, CREATININE, CALCIUM in the last 72 hours.  Invalid input(s): CO CBG (last 3)   Recent Labs  01/29/16 1628 01/29/16 2100 01/30/16 0636  GLUCAP 152* 90 128*    Wt Readings from Last 3 Encounters:  01/27/16 110.9 kg (244 lb 9.6 oz)  01/17/16 117.9 kg (260 lb)  01/15/16 117.9 kg (260 lb)    Physical Exam:  BP 115/77 (BP Location: Left Arm)   Pulse 76   Temp 98 F (36.7 C) (Oral)   Resp 18   Ht 5\' 11"  (1.803 m) Comment: stated  Wt 110.9 kg (244 lb 9.6 oz)   SpO2 97%   BMI 34.11 kg/m   Constitutional: He appears well-developedand well-nourished. NAD. HENT: Normocephalicand atraumatic.  Eyes: Conjunctivaeand EOMare normal.  Cardiovascular: Normal rateand regular rhythm.  Respiratory: Effort normaland breath sounds normal. No respiratory distress.  GI: Soft. Bowel sounds are normal. He exhibits no distension.  Musculoskeletal: He exhibits no edemaor tenderness.  Neurological: He is alertand oriented.  Dysarthria.  Good awareness of deficits Right hemifacial weakness Motor: RUE: shoulder abduction, elbow flexion/extension 4+/5, wrist extension, hand grip 4/5 RLE: Hip flexion 4/5, knee extension 4+/5, ankle dorsi/plantar flexion 2+/5, wiggles toes LUE/LLE: 5/5 proximal to distal Skin: Skin is warmand dry.  Psychiatric: He has a normal mood and affect. His behavior is normal. Thought contentnormal  Assessment/Plan: 1. 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 Physiatrist is providing close team supervision and 24 hour management of active medical problems listed below. Physiatrist and rehab team continue to assess barriers to discharge/monitor patient progress toward functional and medical goals.  Function:  Bathing Bathing position   Position: Shower  Bathing parts Body parts bathed by patient: Right arm, Left arm, Chest, Abdomen, Front perineal area, Buttocks, Right upper leg, Left upper leg, Right lower leg, Left lower leg, Back Body parts bathed by helper: Back  Bathing assist Assist Level: Supervision or verbal cues   Set up : To obtain items  Upper Body Dressing/Undressing Upper body dressing   What is the patient wearing?: Pull over shirt/dress     Pull over shirt/dress - Perfomed by patient: Thread/unthread right sleeve, Thread/unthread left sleeve, Put head through opening, Pull shirt over trunk          Upper body assist Assist Level: Set up   Set up : To obtain clothing/put away  Lower Body Dressing/Undressing Lower body dressing   What is the patient wearing?: Pants, Shoes, Underwear, Socks, AFO Underwear - Performed by patient: Thread/unthread right underwear leg, Thread/unthread left underwear leg, Pull underwear up/down Underwear - Performed by helper: Pull underwear up/down, Thread/unthread right underwear leg Pants- Performed by patient: Thread/unthread right pants leg, Thread/unthread left pants leg, Pull pants up/down Pants- Performed by helper: Thread/unthread right pants leg, Pull pants up/down     Socks - Performed by patient: Don/doff  right sock, Don/doff left sock Socks - Performed by helper: Don/doff left sock, Don/doff right sock Shoes - Performed by patient: Don/doff right shoe, Don/doff left shoe, Fasten right, Fasten left Shoes -  Performed by helper: Don/doff right shoe, Fasten right AFO - Performed by patient: Don/doff right AFO AFO - Performed by helper: Don/doff right AFO      Lower body assist Assist for lower body dressing: More than reasonable time, Supervision or verbal cues      Toileting Toileting   Toileting steps completed by patient: Adjust clothing prior to toileting, Performs perineal hygiene, Adjust clothing after toileting   Toileting Assistive Devices: Grab bar or rail  Toileting assist Assist level: Supervision or verbal cues   Transfers Chair/bed transfer   Chair/bed transfer method: Ambulatory Chair/bed transfer assist level: Supervision or verbal cues Chair/bed transfer assistive device: Armrests, Orthosis     Locomotion Ambulation     Max distance: 65400ft Assist level: Supervision or verbal cues   Wheelchair   Type: Manual Max wheelchair distance: 11050ft Assist Level: Supervision or verbal cues  Cognition Comprehension Comprehension assist level: Follows complex conversation/direction with no assist  Expression Expression assist level: Expresses complex ideas: With no assist  Social Interaction Social Interaction assist level: Interacts appropriately with others - No medications needed.  Problem Solving Problem solving assist level: Solves complex problems: Recognizes & self-corrects  Memory Memory assist level: Complete Independence: No helper    Medical Problem List and Plan: 1. 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  Cont CIR, PT, OT  Bracing  Fluoxetine started on 8/16 2. DVT Prophylaxis/Anticoagulation: Subcutaneous Lovenox. Monitor platelet counts of any signs of bleeding 3. Pain Management: Tylenol as needed 4. Hypertension.  Vitals:   01/29/16 1305 01/30/16 0638  BP: 122/81 115/77  Pulse: 79 76  Resp: 18 18  Temp: 98.4 F (36.9 C) 98 F (36.7 C)    HCTZ 12.5 mg daily,  changed to 25 mg on 8/16, changed to chlorthalidone 25 on 8/17, increased to 50 on 8/18. BPs a little soft will reduce to 25mg   Monitor with increased mobility  Lisinopril 20 mg daily increased to 40 on 8/14  Improving, Will cont to monitor 5. Neuropsych: This patient iscapable of making decisions on hisown behalf. 6. Skin/Wound Care: Routine skin checks 7. Fluids/Electrolytes/Nutrition: Routine I&O's  8. Newly diagnosed diabetes mellitus. Hemoglobin A1c 8.5.   Check blood sugars before meals and at bedtime.   Provide diabetic teaching.  Metformin started 8/11, increased to BID on 8/14, increased to 850mg  BID on 8/16- currently controlled CBG (last 3)   Recent Labs  01/29/16 1628 01/29/16 2100 01/30/16 0636  GLUCAP 152* 90 128*     Will cont to monitor, improving 9.?Chronic renal insufficiency, likely AKI as Cr has not stabalized  BMP WNL on 8/17 10.Hyperlipidemia. Lipitor 11.Tobacco abuse. Nicoderm patch. Provide counseling 12. Obesity:   Body mass index is 34.11 kg/m.  Diet and exercise education  Will cont to encourage weight loss to increase endurance and promote overall health   LOS (Days) 10 A FACE TO FACE EVALUATION WAS PERFORMED  Ericberto Padget E 01/30/2016 10:20 AM

## 2016-01-30 NOTE — Progress Notes (Signed)
Occupational Therapy Session Note  Patient Details  Name: [name] [name]. MRN: [id number] Date of Birth: December 06, 1965  Today's Date: 01/30/2016 OT Individual Time: 2876-8115 OT Individual Time Calculation (min): 62 min     Short Term Goals: Week 1:  OT Short Term Goal 1 (Week 1): Pt will complete lower body dressing with minimal assistance OT Short Term Goal 1 - Progress (Week 1): Met OT Short Term Goal 2 (Week 1): Pt will initiate R hand with min questioning cueing in his BADLs OT Short Term Goal 2 - Progress (Week 1): Met OT Short Term Goal 3 (Week 1): Pt will be issued HEP for R UE  OT Short Term Goal 3 - Progress (Week 1): Met OT Short Term Goal 4 (Week 1): Pt will maintain dynamic standing balance with 50% supervision OT Short Term Goal 4 - Progress (Week 1): Met Week 2:  OT Short Term Goal 1 (Week 2): Pt will don/doff AFO using adaptive equipmet as needed with supervision  OT Short Term Goal 2 (Week 2): Pt will maintain dynamic standing balance while reaching outside base of support with 25% supervision OT Short Term Goal 3 (Week 2): Pt will participate in R hand fine motor excercises daily without cues  Skilled Therapeutic Interventions/Progress Updates:   Pt participated in skilled OT session focusing on NMRE of R UE/LE, safety awareness, and d/c planning. Pt was eating breakfast upon skilled OT arrival. Cues provided to use R UE for manipulating utensils with pt demonstrating weak tripod grasp. Pt encouraged to use R UE into at least 50% of eating at home due to R hand being dominant. Pt was agreeable to shower afterwards. ADL items collected with supervision for safety while picking items off of floor. Pt declined to don R AFO prior to completing shower after education on safety. Shower transfer completed with simulated threshold and supervision for safety with R LE clearance. Pt completed bathing with supervision for balance without use of grab bars per setup in home environment.  Education provided on use of gripper socks for exiting home shower to increase safety with pt verbalizing understanding. UB/LB dressing (and AFO) completed at EOB with extra time. Pt ambulated to cafeteria and integrated R UE into coffee preparation task with cues for upgrading demands of task. Pt returned to room and was left in w/c with all needs within reach.  No c/o pain. DME needs discussed with pt verbalizing no need for equipment at d/c.   Therapy Documentation Precautions:  Precautions Precautions: Fall Restrictions Weight Bearing Restrictions: No   ADL: ADL Grooming: Supervision/safety Where Assessed-Grooming: Standing at sink Upper Body Bathing: Supervision/safety Where Assessed-Upper Body Bathing: Shower Lower Body Bathing: Supervision/safety Where Assessed-Lower Body Bathing: Shower Upper Body Dressing: Supervision/safety Where Assessed-Upper Body Dressing: Sitting at sink Lower Body Dressing: Minimal assistance Where Assessed-Lower Body Dressing: Sitting at sink Exercises:   Other Treatments:    See Function Navigator for Current Functional Status.   Therapy/Group: Individual Therapy  Belal Scallon A Tyshun Tuckerman 01/30/2016, 12:30 PM

## 2016-01-31 ENCOUNTER — Inpatient Hospital Stay (HOSPITAL_COMMUNITY): Payer: Managed Care, Other (non HMO) | Admitting: Occupational Therapy

## 2016-01-31 ENCOUNTER — Inpatient Hospital Stay (HOSPITAL_COMMUNITY): Payer: Managed Care, Other (non HMO) | Admitting: Physical Therapy

## 2016-01-31 ENCOUNTER — Encounter: Payer: Self-pay | Admitting: Nurse Practitioner

## 2016-01-31 LAB — CBC WITH DIFFERENTIAL/PLATELET
BASOS ABS: 0.1 10*3/uL (ref 0.0–0.1)
Basophils Relative: 1 %
EOS PCT: 3 %
Eosinophils Absolute: 0.3 10*3/uL (ref 0.0–0.7)
HEMATOCRIT: 44.2 % (ref 39.0–52.0)
Hemoglobin: 14.4 g/dL (ref 13.0–17.0)
LYMPHS ABS: 2.9 10*3/uL (ref 0.7–4.0)
LYMPHS PCT: 28 %
MCH: 31.1 pg (ref 26.0–34.0)
MCHC: 32.6 g/dL (ref 30.0–36.0)
MCV: 95.5 fL (ref 78.0–100.0)
MONO ABS: 1 10*3/uL (ref 0.1–1.0)
MONOS PCT: 9 %
NEUTROS ABS: 6 10*3/uL (ref 1.7–7.7)
Neutrophils Relative %: 59 %
Platelets: 278 10*3/uL (ref 150–400)
RBC: 4.63 MIL/uL (ref 4.22–5.81)
RDW: 12.8 % (ref 11.5–15.5)
WBC: 10.3 10*3/uL (ref 4.0–10.5)

## 2016-01-31 LAB — GLUCOSE, CAPILLARY
GLUCOSE-CAPILLARY: 147 mg/dL — AB (ref 65–99)
GLUCOSE-CAPILLARY: 102 mg/dL — AB (ref 65–99)
Glucose-Capillary: 101 mg/dL — ABNORMAL HIGH (ref 65–99)
Glucose-Capillary: 107 mg/dL — ABNORMAL HIGH (ref 65–99)

## 2016-01-31 LAB — BASIC METABOLIC PANEL
ANION GAP: 10 (ref 5–15)
BUN: 27 mg/dL — AB (ref 6–20)
CALCIUM: 9.8 mg/dL (ref 8.9–10.3)
CO2: 27 mmol/L (ref 22–32)
Chloride: 101 mmol/L (ref 101–111)
Creatinine, Ser: 0.99 mg/dL (ref 0.61–1.24)
GFR calc Af Amer: >60 mL/min (ref >60)
GFR calc non Af Amer: >60 mL/min (ref >60)
GLUCOSE: 106 mg/dL — AB (ref 65–99)
POTASSIUM: 3.8 mmol/L (ref 3.5–5.1)
Sodium: 138 mmol/L (ref 135–145)

## 2016-01-31 MED ORDER — METFORMIN HCL 500 MG PO TABS
1000.0000 mg | ORAL_TABLET | Freq: Two times a day (BID) | ORAL | Status: DC
  Administered 2016-01-31 – 2016-02-02 (×4): 1000 mg via ORAL
  Filled 2016-01-31 (×4): qty 2

## 2016-01-31 NOTE — Progress Notes (Signed)
Occupational Therapy Session Note  Patient Details  Name: [name] RalphHerman Mcgregor Jr. MRN: [id number] Date of Birth: [birth year]    Today's Date: 01/31/2016  Session 1 OT Individual Time: 0800-0900 OT Individual Time Calculation (min): 60 min   Session2 OT Individual Time: 1300-1346 OT Individual Time Calculation (min): 46 min     Short Term Goals: Week 2:  OT Short Term Goal 1 (Week 2): Pt will don/doff AFO using adaptive equipmet as needed with supervision  OT Short Term Goal 2 (Week 2): Pt will maintain dynamic standing balance while reaching outside base of support with 25% supervision OT Short Term Goal 3 (Week 2): Pt will participate in R hand fine motor excercises daily without cues  Skilled Therapeutic Interventions/Progress Updates:    Session 1 Skilled OT session focused on functional use of R UE , safety awareness, functional mobility, and ADL re-training. Pt ambulated into shower with supervision and doffed pants with supervision in standing, despite OT's instruction for pt to sit down to perform LB dressing 2/2 safety concerns. Pt able to step over simulated shower threshold and stand for bathing w/ supervision. Pt then completed LB dressing with supervision and min instructional cue for technique to use R UE . Pt demonstrated improved grasp to fasten shoelaces and don AFO today. Pt then ambulated to group room where he made a cup of coffee using R Ue. Pt returned to room at end of session.    Session 2 Discussed making pt Mod I with PT given pt's improved balance and functional mobility. PT agreed, and OT informed pt of Mod I status in room. Pt safe to get up and ambulate in room and on 4th floor unit without assistance. Pt ambulated with OT to group room where OT session was focused on fine motor activities. Pt participated in R hand intrinsic strengthening activities using theraband, puzzles, and threading large beads, progressing to smaller beads. OT discussed importance of  slowing down and working on manipulating small  Objects and hand translation from palm>fingertips. Pt returned to room at end of session.   Therapy Documentation Precautions:  Precautions Precautions: Fall Restrictions Weight Bearing Restrictions: No  Pain: Pain Assessment Pain Assessment: No/denies pain  Therapy/Group: Individual Therapy  Mal Amabilelisabeth S Akram Kissick 01/31/2016, 4:24 PM

## 2016-01-31 NOTE — Progress Notes (Addendum)
Physical Therapy Session Note  Patient Details  Name: [name] RalphHerman Meadowcroft Jr. MRN: [id number] Date of Birth: 1965-07-10  Today's Date: 01/31/2016 PT Individual Time: 1002-1100 and 1530-1559 PT Individual Time Calculation (min): 58 min and 29 min   Short Term Goals: Week 2:  PT Short Term Goal 1 (Week 2): LTG = STG due to ELOS.   Skilled Therapeutic Interventions/Progress Updates:    Treatment 1: Pt received in w/c & agreeable to PT, denying c/o pain. Session focused on gait training, pt education, standing balance, and BLE strengthening. Discussed safety while in hospital and safety precautions at home. Educated pt not to use power tools after d/c home, and to use either grip socks or shoes when ambulating throughout hospital. Gait training throughout unit, room & bathroom with and without AFO to assess pt's safety with task. Pt able to doff & don shoe & AFO independently. Gait training with PT facilitating weight shifting to R; therapist provided demonstration and instruction for more normalized gait pattern. Utilized cybex kinetron in standing up to 15 cm/sec with cuing for upright posture; activity focused on reciprocal movements and BLE strengthening. Standing balance challenged by having pt stand on rocker board without BUE support and with eyes open/closed. At end of session pt left in w/c in room with all needs within reach.   Treatment 2: Pt received in bed & agreeable to PT, denying c/o pain. Pt transferred to sitting at EOB with mod I & use of bed rails. Educated pt on becoming mod I in room and throughout [address], meaning pt has permission to ambulate around unit & room without assistance from staff. Educated pt that he is not cleared to ambulate off of unit with family until family comes in and they can be checked off by therapist to safely ambulate with him. Remainder of session focused on RLE strengthening & neuro re-ed and gait training. Pt ambulated room<>gym with R AFO, mod I & PT  facilitating increased weight shift to R. Pt performed step ups on 6" step & RUE support only to fatigue, as well as sit<>stands with LUE on increased surface to increase weight bearing through RLE. During sit<>stand activity pt performed transfers with 3 second count focusing on concentric & eccentric control. At end of session pt left sitting in w/c in room with all needs within reach.   Addendum: Notified RN of pt becoming Mod I in room & on unit only.  Therapy Documentation Precautions:  Precautions Precautions: Fall Restrictions Weight Bearing Restrictions: No  Pain: Pain Assessment Pain Score: 0-No pain   See Function Navigator for Current Functional Status.   Therapy/Group: Individual Therapy  Sandi MariscalVictoria M [name] 01/31/2016, 12:21 PM

## 2016-01-31 NOTE — Progress Notes (Signed)
Pt irritated that night staff is refusing to allow him to ambulate in room without assistance. Stated "how can I get better, if I don't try". Attempted to explain to pt regarding concerns for safety, due to his unsteadiness and use of brace. Also explained that if therapy felt that he was capable of being independent in the room, they would advised staff. Pt still disagreeable, and continues to refuse any use of safety equipment (i.e, bed alarm, quick release), as well as staff assistance.

## 2016-01-31 NOTE — Progress Notes (Addendum)
[facility] PHYSICAL MEDICINE & REHABILITATION     PROGRESS NOTE  Subjective/Complaints:  Pt sitting up in his chair.  He is very impulsive.  He is upset about sitting in his chair and believes he should be able to move around his room and the hospital, including outside, freely.  He states he is "being babied".    ROS: Denies CP, SOB, N/V/D.  Objective: Vital Signs: Blood pressure (!) 142/88, pulse 78, temperature 97.9 F (36.6 C), temperature source Oral, resp. rate 18, height 5\' 11"  (1.803 m), weight 110.9 kg (244 lb 9.6 oz), SpO2 98 %. No results found. No results for input(s): WBC, HGB, HCT, PLT in the last 72 hours. No results for input(s): NA, K, CL, GLUCOSE, BUN, CREATININE, CALCIUM in the last 72 hours.  Invalid input(s): CO CBG (last 3)   Recent Labs  01/30/16 1653 01/30/16 2037 01/31/16 0705  GLUCAP 118* 143* 147*    Wt Readings from Last 3 Encounters:  01/27/16 110.9 kg (244 lb 9.6 oz)  01/17/16 117.9 kg (260 lb)  01/15/16 117.9 kg (260 lb)    Physical Exam:  BP (!) 142/88   Pulse 78   Temp 97.9 F (36.6 C) (Oral)   Resp 18   Ht 5\' 11"  (1.803 m) Comment: stated  Wt 110.9 kg (244 lb 9.6 oz)   SpO2 98%   BMI 34.11 kg/m   Constitutional: He appears well-developedand well-nourished. NAD. HENT: Normocephalicand atraumatic.  Eyes: Conjunctivaeand EOMare normal.  Cardiovascular: Normal rateand regular rhythm.  Respiratory: Effort normaland breath sounds normal. No respiratory distress.  GI: Soft. Bowel sounds are normal. He exhibits no distension.  Musculoskeletal: He exhibits no edemaor tenderness.  Neurological: He is alertand oriented.  Dysarthria.  Good awareness of deficits Right hemifacial weakness Motor: RUE: shoulder abduction, elbow flexion/extension 4+/5, wrist extension 4+/5, hand grip 4/5 RLE: Hip flexion 4/5, knee extension 4+/5, ankle dors flexion 3-/5, ankle plantar flexion 4/5 wiggles toes LUE/LLE: 5/5 proximal to distal Skin:  Skin is warmand dry.  Psychiatric: He has a normal mood and affect. His behavior is normal. Thought contentnormal  Assessment/Plan: 1. 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 Physiatrist is providing close team supervision and 24 hour management of active medical problems listed below. Physiatrist and rehab team continue to assess barriers to discharge/monitor patient progress toward functional and medical goals.  Function:  Bathing Bathing position   Position: Shower  Bathing parts Body parts bathed by patient: Right arm, Left arm, Chest, Abdomen, Front perineal area, Buttocks, Right upper leg, Left upper leg, Right lower leg, Left lower leg Body parts bathed by helper: Back  Bathing assist Assist Level: Supervision or verbal cues   Set up : To obtain items  Upper Body Dressing/Undressing Upper body dressing   What is the patient wearing?: Pull over shirt/dress     Pull over shirt/dress - Perfomed by patient: Thread/unthread right sleeve, Thread/unthread left sleeve, Put head through opening, Pull shirt over trunk          Upper body assist Assist Level: More than reasonable time   Set up : To obtain clothing/put away  Lower Body Dressing/Undressing Lower body dressing   What is the patient wearing?: Underwear, Pants, Socks, Shoes, AFO Underwear - Performed by patient: Thread/unthread right underwear leg, Thread/unthread left underwear leg, Pull underwear up/down Underwear - Performed by helper: Pull underwear up/down,  Thread/unthread right underwear leg Pants- Performed by patient: Thread/unthread right pants leg, Thread/unthread left pants leg, Pull pants up/down Pants- Performed by helper: Thread/unthread right pants leg, Pull pants up/down     Socks - Performed by patient: Don/doff right sock, Don/doff  left sock Socks - Performed by helper: Don/doff left sock, Don/doff right sock Shoes - Performed by patient: Don/doff right shoe, Don/doff left shoe, Fasten right, Fasten left Shoes - Performed by helper: Don/doff right shoe, Fasten right AFO - Performed by patient: Don/doff right AFO AFO - Performed by helper: Don/doff right AFO      Lower body assist Assist for lower body dressing: More than reasonable time, Supervision or verbal cues      Toileting Toileting   Toileting steps completed by patient: Adjust clothing prior to toileting, Performs perineal hygiene, Adjust clothing after toileting   Toileting Assistive Devices: Grab bar or rail  Toileting assist Assist level: Supervision or verbal cues   Transfers Chair/bed transfer   Chair/bed transfer method: Ambulatory Chair/bed transfer assist level: Supervision or verbal cues Chair/bed transfer assistive device: Armrests, Orthosis     Locomotion Ambulation     Max distance: 65200ft Assist level: Supervision or verbal cues   Wheelchair   Type: Manual Max wheelchair distance: 16350ft Assist Level: Supervision or verbal cues  Cognition Comprehension Comprehension assist level: Follows complex conversation/direction with no assist  Expression Expression assist level: Expresses complex ideas: With no assist  Social Interaction Social Interaction assist level: Interacts appropriately with others - No medications needed.  Problem Solving Problem solving assist level: Solves complex 90% of the time/cues < 10% of the time  Memory Memory assist level: Complete Independence: No helper    Medical Problem List and Plan: 1. 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  Cont CIR  Bracing  Fluoxetine started on 8/16 2. DVT Prophylaxis/Anticoagulation: Subcutaneous Lovenox. Monitor platelet counts of any signs of bleeding 3. Pain Management: Tylenol as  needed 4. Hypertension.  Vitals:   01/31/16 0530 01/31/16 0800  BP: 127/75 (!) 142/88  Pulse: 79 78  Resp: 18   Temp: 97.9 F (36.6 C)     HCTZ 12.5 mg daily, changed to 25 mg on 8/16, changed to chlorthalidone 25 on 8/17, increased to 50 reduced to 25mg   Monitor with increased mobility  Lisinopril 20 mg daily increased to 40 on 8/14  Improving, Will cont to monitor 5. Neuropsych: This patient iscapable of making decisions on hisown behalf. 6. Skin/Wound Care: Routine skin checks 7. Fluids/Electrolytes/Nutrition: Routine I&O's  8. Newly diagnosed diabetes mellitus. Hemoglobin A1c 8.5.   Check blood sugars before meals and at bedtime.   Provide diabetic teaching.  Metformin started 8/11, increased to BID on 8/14, increased to 850mg  BID on 8/16- increased to 1000 BID on 8/21 CBG (last 3)   Recent Labs  01/30/16 1653 01/30/16 2037 01/31/16 0705  GLUCAP 118* 143* 147*     Will cont to monitor, improving 9.?Chronic renal insufficiency, likely AKI as Cr has not stabalized  BMP WNL on 8/17 10.Hyperlipidemia. Lipitor 11.Tobacco abuse. Nicoderm patch. Provide counseling 12. Obesity:   Body mass index is 34.11 kg/m.  Diet and exercise education  Will cont to encourage weight loss to increase endurance and promote overall health   LOS (Days) 11 A FACE TO FACE EVALUATION WAS PERFORMED  Stephania Macfarlane [name] Nakota Elsen 01/31/2016 9:03 AM

## 2016-02-01 ENCOUNTER — Inpatient Hospital Stay (HOSPITAL_COMMUNITY): Payer: Managed Care, Other (non HMO) | Admitting: Physical Therapy

## 2016-02-01 ENCOUNTER — Inpatient Hospital Stay (HOSPITAL_COMMUNITY): Payer: Managed Care, Other (non HMO) | Admitting: Occupational Therapy

## 2016-02-01 LAB — GLUCOSE, CAPILLARY
GLUCOSE-CAPILLARY: 112 mg/dL — AB (ref 65–99)
Glucose-Capillary: 132 mg/dL — ABNORMAL HIGH (ref 65–99)
Glucose-Capillary: 91 mg/dL (ref 65–99)
Glucose-Capillary: 77 mg/dL (ref 65–99)

## 2016-02-01 MED ORDER — NICOTINE 14 MG/24HR TD PT24
14.0000 mg | MEDICATED_PATCH | Freq: Every day | TRANSDERMAL | Status: DC
  Administered 2016-02-02: 14 mg via TRANSDERMAL
  Filled 2016-02-01: qty 1

## 2016-02-01 MED ORDER — LISINOPRIL 40 MG PO TABS: 40.0000 mg | ORAL_TABLET | Freq: Every day | ORAL | 1 refills | Status: AC

## 2016-02-01 MED ORDER — ATORVASTATIN CALCIUM 20 MG PO TABS: 20.0000 mg | ORAL_TABLET | Freq: Every day | ORAL | 0 refills | Status: AC

## 2016-02-01 MED ORDER — FLUOXETINE HCL 20 MG PO CAPS
20.0000 mg | ORAL_CAPSULE | Freq: Every day | ORAL | Status: DC
  Administered 2016-02-02: 20 mg via ORAL
  Filled 2016-02-01: qty 1

## 2016-02-01 MED ORDER — NICOTINE 14 MG/24HR TD PT24: MEDICATED_PATCH | TRANSDERMAL | 0 refills | Status: DC

## 2016-02-01 MED ORDER — FLUOXETINE HCL 10 MG PO CAPS
10.0000 mg | ORAL_CAPSULE | Freq: Once | ORAL | Status: AC
  Administered 2016-02-01: 10 mg via ORAL
  Filled 2016-02-01: qty 1

## 2016-02-01 MED ORDER — BLOOD GLUCOSE METER KIT: PACK | 0 refills | Status: AC

## 2016-02-01 MED ORDER — METFORMIN HCL 1000 MG PO TABS: 1000.0000 mg | ORAL_TABLET | Freq: Two times a day (BID) | ORAL | 1 refills | Status: AC

## 2016-02-01 MED ORDER — FLUOXETINE HCL 20 MG PO CAPS: 20.0000 mg | ORAL_CAPSULE | Freq: Every day | ORAL | 3 refills | Status: AC

## 2016-02-01 MED ORDER — CHLORTHALIDONE 25 MG PO TABS: 25.0000 mg | ORAL_TABLET | Freq: Every day | ORAL | 1 refills | Status: AC

## 2016-02-01 MED ORDER — TRAMADOL HCL 50 MG PO TABS: 50.0000 mg | ORAL_TABLET | Freq: Four times a day (QID) | ORAL | 0 refills | Status: DC | PRN

## 2016-02-01 NOTE — Discharge Instructions (Signed)
Inpatient Rehab Discharge Instructions  [name] RalphHerman Torrens Jr. Discharge date and time: No discharge date for patient encounter.   Activities/Precautions/ Functional Status: Activity: activity as tolerated Diet: diabetic diet Wound Care: none needed Functional status:  ___ No restrictions     ___ Walk up steps independently ___ 24/7 supervision/assistance   ___ Walk up steps with assistance ___ Intermittent supervision/assistance  ___ Bathe/dress independently ___ Walk with walker     __x_ Bathe/dress with assistance ___ Walk Independently    ___ Shower independently ___ Walk with assistance    ___ Shower with assistance ___ No alcohol     ___ Return to work/school ________  Special Instructions: No driving or tobacco use   COMMUNITY REFERRALS UPON DISCHARGE:    Outpatient: PT & OT  Agency:[facility] Phone: [phone]   Date of Last Service:02/02/2016  Appointment Date/Time:AUGUST 28 Monday 10:30-12:15 PM  Medical Equipment/Items Ordered:NO NEEDS   GENERAL COMMUNITY RESOURCES FOR PATIENT/FAMILY: Support Groups: [facility] SUPPORT GROUP EVERY SECOND Thursday @ 3:00-4:00 PM ON THE REHAB UNIT QUESTIONS CONTACT KATIE [phone]   STROKE/TIA DISCHARGE INSTRUCTIONS SMOKING Cigarette smoking nearly doubles your risk of having a stroke & is the single most alterable risk factor  If you smoke or have smoked in the last 12 months, you are advised to quit smoking for your health.  Most of the excess cardiovascular risk related to smoking disappears within a year of stopping.  Ask you doctor about anti-smoking medications  [place] Quit Line: 1-800-QUIT NOW  Free Smoking Cessation Classes [phone]  CHOLESTEROL Know your levels; limit fat & cholesterol in your diet  Lipid Panel     Component Value Date/Time   CHOL 139 01/15/2016 0543   TRIG 127 01/15/2016 0543   HDL 37 (L) 01/15/2016 0543   CHOLHDL 3.8 01/15/2016 0543   VLDL 25 01/15/2016 0543   LDLCALC 77  01/15/2016 0543      Many patients benefit from treatment even if their cholesterol is at goal.  Goal: Total Cholesterol (CHOL) less than 160  Goal:  Triglycerides (TRIG) less than 150  Goal:  HDL greater than 40  Goal:  LDL (LDLCALC) less than 100   BLOOD PRESSURE American Stroke Association blood pressure target is less that 120/80 mm/Hg  Your discharge blood pressure is:  BP: (!) 141/85  Monitor your blood pressure  Limit your salt and alcohol intake  Many individuals will require more than one medication for high blood pressure  DIABETES (A1c is a blood sugar average for last 3 months) Goal HGBA1c is under 7% (HBGA1c is blood sugar average for last 3 months)  Diabetes:    Lab Results  Component Value Date   HGBA1C 8.5 (H) 01/15/2016     Your HGBA1c can be lowered with medications, healthy diet, and exercise.  Check your blood sugar as directed by your physician  Call your physician if you experience unexplained or low blood sugars.  PHYSICAL ACTIVITY/REHABILITATION Goal is 30 minutes at least 4 days per week  Activity: Increase activity slowly, Therapies: Physical Therapy: Home Health Return to work:   Activity decreases your risk of heart attack and stroke and makes your heart stronger.  It helps control your weight and blood pressure; helps you relax and can improve your mood.  Participate in a regular exercise program.  Talk with your doctor about the best form of exercise for you (dancing, walking, swimming, cycling).  DIET/WEIGHT Goal is to maintain a healthy weight  Your discharge diet is: Diet  heart healthy/carb modified Room service appropriate? Yes; Fluid consistency: Thin  liquids Your height is:  Height: 5\' 11"  (180.3 cm) (stated) Your current weight is: Weight: 113.5 kg (250 lb 3.2 oz) Your Body Mass Index (BMI) is:  BMI (Calculated): 35  Following the type of diet specifically designed for you will help prevent another stroke.  Your goal weight  range is:    Your goal Body Mass Index (BMI) is 19-24.  Healthy food habits can help reduce 3 risk factors for stroke:  High cholesterol, hypertension, and excess weight.  RESOURCES Stroke/Support Group:  Call [phone]   STROKE EDUCATION PROVIDED/REVIEWED AND GIVEN TO PATIENT Stroke warning signs and symptoms How to activate emergency medical system (call 911). Medications prescribed at discharge. Need for follow-up after discharge. Personal risk factors for stroke. Pneumonia vaccine given:  Flu vaccine given:  My questions have been answered, the writing is legible, and I understand these instructions.  I will adhere to these goals & educational materials that have been provided to me after my discharge from the hospital.      My questions have been answered and I understand these instructions. I will adhere to these goals and the provided educational materials after my discharge from the hospital.  Patient/Caregiver Signature _______________________________ Date __________  Clinician Signature _______________________________________ Date __________  Please bring this form and your medication list with you to all your follow-up doctor's appointments.

## 2016-02-01 NOTE — Plan of Care (Signed)
Problem: RH Ambulation Goal: LTG Patient will ambulate in controlled environment (PT) LTG: Patient will ambulate in a controlled environment, # of feet with assistance (PT).  Outcome: Completed/Met Date Met: 02/01/16 >200 ft Goal: LTG Patient will ambulate in home environment (PT) LTG: Patient will ambulate in home environment, # of feet with assistance (PT).  Outcome: Completed/Met Date Met: 02/01/16 50 ft Goal: LTG Patient will ambulate in community environment (PT) LTG: Patient will ambulate in community environment, # of feet with assistance (PT).  Outcome: Completed/Met Date Met: 02/01/16 150 ft  Problem: RH Wheelchair Mobility Goal: LTG Patient will propel w/c in controlled environment (PT) LTG: Patient will propel wheelchair in controlled environment, # of feet with assist (PT)  Outcome: Adequate for Discharge Pt to d/c at ambulatory level  Problem: RH Stairs Goal: LTG Patient will ambulate up and down stairs w/assist (PT) LTG: Patient will ambulate up and down # of stairs with assistance (PT)  Outcome: Completed/Met Date Met: 02/01/16 12 steps with 1 or 2 UE support (pt reports he has both rails at steps in one location)

## 2016-02-01 NOTE — Discharge Summary (Signed)
Physical Therapy Discharge Summary  Patient Details  Name: [name] [name]. MRN: [id number] Date of Birth: Aug 20, 1965  Today's Date: 02/01/2016 PT Individual Time: 8453-6468 PT Individual Time Calculation (min): 75 min     Patient has met 12 of 12 long term goals due to improved activity tolerance, improved balance, increased strength, ability to compensate for deficits, functional use of  right upper extremity and right lower extremity and improved awareness.  Patient to discharge at an ambulatory level Modified Independent.   Patient does not require a care partner as pt is discharging home at Mod I level.  Reasons goals not met: n/a  Recommendation:  Patient will benefit from ongoing skilled PT services in outpatient setting to continue to advance safe functional mobility, address ongoing impairments in fine motor skills in RLE, continue strengthening RLE, neur re-ed RLE & RUE, and minimize fall risk.  Equipment: R AFO  Reasons for discharge: treatment goals met  Patient/family agrees with progress made and goals achieved: Yes  Skilled PT Treatment: Pt received in recliner & agreeable to PT, noting 1/10 R wrist ache. During session pt ambulated room<>gym with R AFO with increasing R hip hike 2/2 fatigue by end of session. Therapist continued to provide manual facilitation for weight shifting R during gait. Utilized nu-step up to level 8 x 15 minutes without LUE for cardiovascular endurance training. Pt completed Berg Balance Test & scored 55/56. Patient demonstrates increased fall risk as noted by score of 55/56 on Berg Balance Scale.  (<36= high risk for falls, close to 100%; 37-45 significant >80%; 46-51 moderate >50%; 52-55 lower >25%). Pt able to complete TUG in 14 seconds with R AFO donned. Discussed modifying diet (heart healthy & diabetic), as well as exercise, after d/c from CIR. Educated pt to monitor how he is feeling during exercise and to not push himself too much too  quickly; pt voiced understanding. Manual testing completed, please see below for further details. Pt continues to demonstrate weakness in RLE in isolated muscle groups. Pt's RUE rapid alternating movements and fine motor movements are still impaired. Pt agreeable to OPPT following d/c and is aware he is unable to drive at this time. At end of session pt left in room with all needs within reach.   Pt continues to remain impulsive with activity but is very motivated to return to PLOF & prior occupation. Educated pt on importance of safety with all functional tasks.   PT Discharge Precautions/Restrictions Precautions Precautions: None Restrictions Weight Bearing Restrictions: No   Pain Pain Assessment Pain Assessment: 0-10 Pain Score: 1  Pain Location: Wrist Pain Orientation: Right Pain Descriptors / Indicators: Aching Pain Intervention(s): Ambulation/increased activity   Vision/Perception  Pt wears glasses at all times. Pt reports no changes in baseline vision.  Cognition Overall Cognitive Status: Within Functional Limits for tasks assessed Arousal/Alertness: Awake/alert Orientation Level: Oriented X4 Attention: Focused Memory: Appears intact Behaviors: Impulsive  Sensation Sensation Light Touch: Appears Intact (BLE) Proprioception: Appears Intact (BLE) Coordination Gross Motor Movements are Fluid and Coordinated:  (rapid alternating movements impaired RUE) Finger Nose Finger Test:  (8 seconds RUE, 5 seconds LUE) Heel Shin Test:  (3 seconds LLE, 4 seconds RLE)   Mobility Bed Mobility Bed Mobility: Rolling Right;Rolling Left;Supine to Sit;Sit to Supine (traditional bed) Rolling Right: 6: Modified independent (Device/Increase time) Rolling Left: 6: Modified independent (Device/Increase time) Right Sidelying to Sit: 6: Modified independent (Device/Increase time) Supine to Sit: 6: Modified independent (Device/Increase time) Transfers Transfers: Yes Sit to Stand: 6:  Modified independent (Device/Increase time) Stand to Sit: 6: Modified independent (Device/Increase time) Stand Pivot Transfers: 6: Modified independent (Device/Increase time)  Locomotion  Ambulation Ambulation: Yes Ambulation/Gait Assistance: 6: Modified independent (Device/Increase time) Ambulation Distance (Feet): 200 Feet Assistive device: None;Other (Comment) (R AFO) Stairs / Additional Locomotion Stairs: Yes Stairs Assistance: 6: Modified independent (Device/Increase time) Stair Management Technique: Two rails Number of Stairs: 12 Height of Stairs: 6 Ramp: 6: Modified independent (Device) Curb: 6: Modified independent (Device/increase time) Product manager Mobility: No   Balance Balance Balance Assessed: Yes Standardized Balance Assessment Standardized Balance Assessment: Berg Balance Test;Timed Up and Go Test Berg Balance Test Sit to Stand: Able to stand without using hands and stabilize independently Standing Unsupported: Able to stand safely 2 minutes Sitting with Back Unsupported but Feet Supported on Floor or Stool: Able to sit safely and securely 2 minutes Stand to Sit: Sits safely with minimal use of hands Transfers: Able to transfer safely, minor use of hands Standing Unsupported with Eyes Closed: Able to stand 10 seconds safely Standing Ubsupported with Feet Together: Able to place feet together independently and stand 1 minute safely From Standing, Reach Forward with Outstretched Arm: Can reach confidently >25 cm (10") (LUE; only able to reach ~7 inches with RUE) From Standing Position, Pick up Object from Floor: Able to pick up shoe safely and easily From Standing Position, Turn to Look Behind Over each Shoulder: Looks behind from both sides and weight shifts well Turn 360 Degrees: Able to turn 360 degrees safely one side only in 4 seconds or less (5 seconds to L, 4 seconds to R) Standing Unsupported, Alternately Place Feet on Step/Stool: Able to  stand independently and safely and complete 8 steps in 20 seconds Standing Unsupported, One Foot in Front: Able to place foot tandem independently and hold 30 seconds Standing on One Leg: Able to lift leg independently and hold > 10 seconds (RLE elevated) Total Score: 55 Timed Up and Go Test TUG: Normal TUG Normal TUG (seconds): 14 (R AFO)   Extremity Assessment  RLE Assessment RLE Assessment: Exceptions to [facility] RLE AROM (degrees) Overall AROM Right Lower Extremity: Within functional limits for tasks assessed RLE Strength Right Hip Flexion: 3/5 Right Knee Flexion: 3+/5 Right Knee Extension: 3-/5 Right Ankle Dorsiflexion: 1/5 LLE Assessment LLE Assessment: Exceptions to WFL LLE AROM (degrees) Overall AROM Left Lower Extremity: Within functional limits for tasks assessed LLE Strength Left Hip Flexion: 5/5 Left Knee Flexion: 5/5 Left Knee Extension: 5/5 Left Ankle Dorsiflexion: 5/5   See Function Navigator for Current Functional Status.  Waunita Schooner 02/01/2016, 3:59 PM

## 2016-02-01 NOTE — Progress Notes (Signed)
Social Work Patient ID: [name] RalphHerman Parkerson Jr., male   DOB: 1966-03-27, 50 y.o.   MRN: [id number]   Team has made pt mod/i in his room and he has preceded to ambulate up and down the hall. MD made aware and feels  Pt can discharge tomorrow after therapies. Pt informed he will be ready for discharge tomorrow after therapies instead of Thursday due to pt's progress. He is agreeable and will contact wife to let her know of the change in plans. Discussed OP therapies and he is agreeable to this, have made referral to [facility]. No equipment needs. Work toward discharge tomorrow.

## 2016-02-01 NOTE — Discharge Summary (Signed)
Occupational Therapy Discharge Summary  Patient Details  Name: [name] [name]. MRN: [id number] Date of Birth: Aug 31, 1965  Today's Date: 02/01/2016 OT Individual Time: 1003-1100 OT Individual Time Calculation (min): 57 min    Patient has met 11 of 11 long term goals due to improved activity tolerance, improved balance, functional use of  RIGHT upper and RIGHT lower extremity and improved coordination.  Patient to discharge at overall Modified Independent level.  Patient's care partner is independent to provide the necessary physical assistance at discharge.     Recommendation:  Patient will benefit from ongoing skilled OT services in outpatient setting to continue to advance functional skills in the area of BADL, iADL and Vocation.  Equipment: Thera-puddy, thera-bands  Reasons for discharge: treatment goals met and discharge from hospital  Patient/family agrees with progress made and goals achieved: Yes  Pt seen for skilled OT session today focused on ADL/self-care, functional transfers, safety awareness, fine motor coordination, IADLs, and functional use of R UE , Pt ambulated into bathroom and completed shower/toilet transfer w/ modified independence. Pt able to complete bathing, grooming, toileting,  UB and LB dressing, including donning R AFO w/ modified independence today. Pt then participated in fine motor coordination activities focusing on pincer grasp, translation, and shifting objects.    OT Discharge Precautions/Restrictions Precautions Precautions: None Restrictions Weight Bearing Restrictions: No d Pain Pain Assessment Pain Assessment: No/denies pain  ADL ADL Grooming: Modified Independent  Where Assessed-Grooming: Standing at sink Upper Body Bathing: Modified Independent Where Assessed-Upper Body Bathing: Shower Lower Body Bathing: Modified Independent Where Assessed-Lower Body Bathing: Shower Upper Body Dressing: Modified Independent Where Assessed-Upper  Body Dressing: Sitting at EOB Lower Body Dressing: Modified Independent  Where Assessed-Lower Body Dressing: Sitting at EOB Vision/Perception  Vision- History Baseline Vision/History: Wears glasses Wears Glasses: At all times Patient Visual Report: No change from baseline Vision- Assessment Vision Assessment?: No apparent visual deficits  Cognition Overall Cognitive Status: Within Functional Limits for tasks assessed Arousal/Alertness: Awake/alert Orientation Level: Oriented X4 Attention: Focused Memory: Appears intact Behaviors: Impulsive Sensation Sensation Light Touch: Appears Intact Proprioception: Appears Intact Coordination Gross Motor Movements are Fluid and Coordinated:  (rapid alternating movements impaired RUE) Coordination and Movement Description: R UE decreased fine motor coordination, improving  Balance Dynamic Standing Balance Dynamic Standing - Level of Assistance: 6: Modified independent (Device/Increase time) Extremity/Trunk Assessment RUE AROM (degrees) Right Shoulder Flexion: 120 Degrees Right Elbow Flexion: 120 Right Forearm Supination: 40 Degrees Right Wrist Extension: 50 Degrees RUE Strength RUE Overall Strength: Deficits Right Shoulder Flexion: 3/5 Right Elbow Flexion: 3/5 Right Forearm Supination: 3-/5 Right Wrist Extension: 3/5 Right Hand Gross Grasp: Impaired LUE Assessment LUE Assessment: Within Functional Limits   See Function Navigator for Current Functional Status.  Daneen Schick Petronella Shuford 02/01/2016, 4:35 PM

## 2016-02-01 NOTE — Discharge Summary (Signed)
NAMHendricks [name]:  [name] [name]             ACCOUNT NO.:  [id number]  MEDICAL RECORD NO.:  [id number]  LOCATION:  4W02C                        FACILITY:  [facility]  PHYSICIAN:  [name] MorrowAnkit Patel, MD        DATE OF BIRTH:  03-11-1966  DATE OF ADMISSION:  01/20/2016 DATE OF DISCHARGE:  02/02/2016                              DISCHARGE SUMMARY   DISCHARGE DIAGNOSES: 1. Left pontine infarct. 2. Subcutaneous Lovenox for deep vein thrombosis prophylaxis. 3. Hypertension. 4. Newly diagnosed diabetes mellitus. 5. Hyperlipidemia. 6. Tobacco abuse. 7. Obesity.  HISTORY OF PRESENT ILLNESS:  This is a 50 year old right-handed male with history of hypertension, tobacco abuse, chronic renal insufficiency with creatinine of 1.3.  Lives with spouse independent prior to admission.  The patient recently admitted to the hospital with a small left pontine infarct on January 15, 2016, discharged to home on January 16, 2016, at supervision level.  Maintained on aspirin.  Presented back on January 17, 2016, with increasing right-sided weakness as well as facial droop, slurred speech.  The patient did not receive tPA.  MRI of the brain did not show any new areas of infarct, suspect evolution of prior pontine infarct.  MRA of the head and angiogram of the neck showed no significant carotid stenosis in the neck.  Neurology followup, remained on aspirin therapy.  Subcutaneous Lovenox for DVT prophylaxis.  Newly diagnosed diabetes mellitus.  Hemoglobin A1c of 8.5.  Physical and occupational therapy ongoing.  The patient was admitted for a comprehensive rehab program.  PAST MEDICAL HISTORY:  See discharge diagnoses.  SOCIAL HISTORY:  Lives with spouse, independent prior to admission. Functional status upon admission to rehab services was moderate assist to 75 feet, 1-person handheld assistance; minimal guard, sit to stand; min-to-mod assist for activities of daily living.  PHYSICAL EXAMINATION:  VITAL SIGNS:  Blood pressure  149/80, pulse 79, temperature 98, respirations 20. GENERAL:  This was an alert male, oriented to person, place, and time. Dysarthric speech, but intelligible.  Fair awareness of deficits. LUNGS:  Clear to auscultation without wheeze. CARDIAC:  Regular rate and rhythm without murmur. ABDOMEN:  Soft, nontender.  Good bowel sounds.  REHABILITATION HOSPITAL COURSE:  The patient was admitted to inpatient rehab services with therapies initiated on a 3-hour daily basis consisting of physical therapy, occupational therapy, speech therapy, and rehabilitation nursing.  The following issues were addressed during the patient's rehabilitation stay.  Pertaining to [name] left pontine infarct remained stable, maintained on aspirin therapy, he would follow up with neurology Services.  Subcutaneous Lovenox for DVT prophylaxis.  No bleeding episodes.  Blood pressures controlled. Monitored on Hygroton, lisinopril.  Arrangements were to be made to follow up with a PCP.  Newly diagnosed hemoglobin A1c 8.5, diabetes mellitus.  Placed on Glucophage, full diabetic teaching.  Question chronic renal insufficiency, baseline creatinine 1.3, stabilized, monitored.  He did have a history of tobacco abuse, maintained on a NicoDerm patch.  He received full counsel in regard to cessation of nicotine products.  It was questionable if he would be compliant with these requests.  Obesity with a body mass index of 34.11, diet and exercise education provided.  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  The patient received weekly collaborative interdisciplinary team conferences to discuss estimated length of stay, family teaching, any barriers to discharge.  He was discharged to a modified independent level.  Transferred to sitting at edge of bed, modified independence. He could navigate stairs, supervision.  He was fitted with a right  AFO brace.  Gather belongings for activities of daily living and homemaking. He was advised no driving.  Showed good safety awareness.  He was discharged to home.  DISCHARGE MEDICATIONS:  Included: 1. Aspirin 325 mg p.o. daily. 2. Lipitor 20 mg p.o. daily. 3. Hygroton 25 mg p.o. daily. 4. Prozac 20 mg p.o. daily. 5. Lisinopril 40 mg p.o. daily. 6. Glucophage 1000 mg p.o. b.i.d. 7. NicoDerm patch, taper as directed. 8. Ultram 50-100 mg every 6 hours as needed for moderate pain,     dispense of 30 tablets.  DIET:  Diabetic diet.  FOLLOWUP:  The patient would follow up with Dr. Maryla MorrowAnkit [name], at the outpatient rehab service office as directed; Dr. Delia HeadyPramod [name] in 1 month, call for appointment.  Case manager to arrange PCP follow up.  SPECIAL INSTRUCTIONS:  No driving.  No smoking.  No alcohol.     [name] [name], P.A.   ______________________________ [name] MorrowAnkit Patel, MD    DA/MEDQ  D:  02/01/2016  T:  02/01/2016  Job:  [id number]  cc:   [name] P. Pearlean BrownieSethi, MD

## 2016-02-01 NOTE — Progress Notes (Signed)
Social Work  Discharge Note  The overall goal for the admission was met for:   Discharge location: [place]  Length of Stay: Yes-13 DAYS  Discharge activity level: Yes-MOD/I LEVEL  Home/community participation: Yes  Services provided included: MD, RD, PT, OT, SLP, RN, CM, TR, Pharmacy, Neuropsych and SW  Financial Services: Private Insurance: [employer]  Follow-up services arranged: Outpatient: [facility]-8/28-MONDAY @ 10:30-12;15 PT & OT  Comments (or additional information):PT DID WELL AND REACHED HIS GOALS ONE DAY PRIOR TO SLATED DISCHARGE, REASON FOR DISCHARGE BEING MOVED UP. PCP ARRANGED AND APPOINTMENT MADE FOR FOLLOW UP. AWARE OF TOBACCO CESSATION RESOURCES. FEELS READY FOR DISCHARGE  Patient/Family verbalized understanding of follow-up arrangements: Yes  Individual responsible for coordination of the follow-up plan: SELF & LISA-WIFE  Confirmed correct DME delivered: Elease Hashimoto 02/01/2016    Elease Hashimoto

## 2016-02-01 NOTE — Progress Notes (Signed)
[facility]     PROGRESS NOTE  Subjective/Complaints:  Pt seen sitting up in his chair.  He was made independent in halls.  He is not wearing his braces and states he does not need them.  He slept well overnight.    ROS: Denies CP, SOB, N/V/D.  Objective: Vital Signs: Blood pressure 132/78, pulse 78, temperature 98 F (36.7 C), temperature source Oral, resp. rate 18, height $RemoveBefor [id number]$5\' 11"96 %. No results found.  Recent Labs  01/31/16 1227  WBC 10.3  HGB 14.4  HCT 44.2  PLT 278    Recent Labs  01/31/16 1227  NA 138  K 3.8  CL 101  GLUCOSE 106*  BUN 27*  CREATININE 0.99  CALCIUM 9.8   CBG (last 3)   Recent Labs  01/31/16 1621 01/31/16 2119 02/01/16 0626  GLUCAP 101* 107* 132*    Wt Readings from Last 3 Encounters:  01/27/16 110.9 kg (244 lb 9.6 oz)  01/17/16 117.9 kg (260 lb)  01/15/16 117.9 kg (260 lb)    Physical Exam:  BP 132/78 (BP Location: Left Arm)   Pulse 78   Temp 98 F (36.7 C) (Oral)   Resp 18   Ht 5\' 11"  (1.803 m) Comment: stated  Wt 110.9 kg (244 lb 9.6 oz)   SpO2 96%   BMI 34.11 kg/m   Constitutional: He appears well-developedand well-nourished. NAD. HENT: Normocephalicand atraumatic.  Eyes: Conjunctivaeand EOMare normal.  Cardiovascular: Normal rateand regular rhythm.  Respiratory: Effort normaland breath sounds normal. No respiratory distress.  GI: Soft. Bowel sounds are normal. He exhibits no distension.  Musculoskeletal: He exhibits no edemaor tenderness.  Neurological: He is alertand oriented.  Dysarthria.  Good awareness of deficits Right hemifacial weakness Motor: RUE: shoulder abduction, elbow flexion/extension 4+/5, wrist extension 4+/5, hand grip 4/5 RLE: Hip flexion 4/5, knee extension 4+/5, ankle dors flexion 3/5, ankle plantar flexion 4/5 wiggles toes LUE/LLE: 5/5 proximal to distal Skin: Skin is warmand dry.  Psychiatric: He has a  normal mood and affect. His behavior is normal. Thought contentnormal  Assessment/Plan: 1. 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 Physiatrist is providing close team supervision and 24 hour management of active medical problems listed below. Physiatrist and rehab team continue to assess barriers to discharge/monitor patient progress toward functional and medical goals.  Function:  Bathing Bathing position   Position: Shower  Bathing parts Body parts bathed by patient: Right arm, Left arm, Chest, Abdomen, Front perineal area, Buttocks, Right upper leg, Left upper leg, Right lower leg, Left lower leg Body parts bathed by helper: Back  Bathing assist Assist Level: Supervision or verbal cues   Set up : To obtain items  Upper Body Dressing/Undressing Upper body dressing   What is the patient wearing?: Pull over shirt/dress     Pull over shirt/dress - Perfomed by patient: Thread/unthread right sleeve, Thread/unthread left sleeve, Put head through opening, Pull shirt over trunk          Upper body assist Assist Level: More than reasonable time   Set up : To obtain clothing/put away  Lower Body Dressing/Undressing Lower body dressing   What is the patient wearing?: Underwear, Pants, Socks, Shoes, AFO Underwear - Performed by patient: Thread/unthread right underwear leg, Thread/unthread left underwear leg, Pull underwear up/down Underwear -  Performed by helper: Pull underwear up/down, Thread/unthread right underwear leg Pants- Performed by patient: Thread/unthread right pants leg, Thread/unthread left pants leg, Pull pants up/down Pants- Performed by helper: Thread/unthread right pants leg, Pull pants up/down     Socks - Performed by patient: Don/doff right sock, Don/doff left sock Socks - Performed by helper: Don/doff  left sock, Don/doff right sock Shoes - Performed by patient: Don/doff right shoe, Don/doff left shoe, Fasten right, Fasten left Shoes - Performed by helper: Don/doff right shoe, Fasten right AFO - Performed by patient: Don/doff right AFO AFO - Performed by helper: Don/doff right AFO      Lower body assist Assist for lower body dressing: More than reasonable time, Supervision or verbal cues      Toileting Toileting   Toileting steps completed by patient: Adjust clothing prior to toileting, Performs perineal hygiene, Adjust clothing after toileting   Toileting Assistive Devices: Grab bar or rail  Toileting assist Assist level: Supervision or verbal cues   Transfers Chair/bed transfer   Chair/bed transfer method: Ambulatory Chair/bed transfer assist level: Supervision or verbal cues Chair/bed transfer assistive device: Armrests, Orthosis     Locomotion Ambulation     Max distance: 200 ft Assist level: No help, No cues, assistive device, takes more than a reasonable amount of time   Wheelchair   Type: Manual Max wheelchair distance: 17050ft Assist Level: Supervision or verbal cues  Cognition Comprehension Comprehension assist level: Follows complex conversation/direction with no assist  Expression Expression assist level: Expresses complex ideas: With no assist  Social Interaction Social Interaction assist level: Interacts appropriately with others - No medications needed.  Problem Solving Problem solving assist level: Solves complex problems: Recognizes & self-corrects  Memory Memory assist level: Complete Independence: No helper    Medical Problem List and Plan: 1. 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  Cont CIR  Fluoxetine started on 8/16, increased 8/22 2. DVT Prophylaxis/Anticoagulation: Subcutaneous Lovenox. Monitor platelet counts of any signs of bleeding 3. Pain Management:  Tylenol as needed 4. Hypertension.  Vitals:   02/01/16 0551 02/01/16 0759  BP: 120/64 132/78  Pulse: 71 78  Resp: 18   Temp: 98 F (36.7 C)     HCTZ 12.5 mg daily, changed to 25 mg on 8/16, changed to chlorthalidone 25 on 8/17, increased to 50 reduced to 25mg  on 8/21  Monitor with increased mobility  Lisinopril 20 mg daily increased to 40 on 8/14  Improving, Will cont to monitor 5. Neuropsych: This patient iscapable of making decisions on hisown behalf. 6. Skin/Wound Care: Routine skin checks 7. Fluids/Electrolytes/Nutrition: Routine I&O's  8. Newly diagnosed diabetes mellitus. Hemoglobin A1c 8.5.   Check blood sugars before meals and at bedtime.   Provide diabetic teaching.  Metformin started 8/11, increased to BID on 8/14, increased to 850mg  BID on 8/16- increased to 1000 BID on 8/21 CBG (last 3)   Recent Labs  01/31/16 1621 01/31/16 2119 02/01/16 0626  GLUCAP 101* 107* 132*    Will cont to monitor, improving 9.?Chronic renal insufficiency, likely AKI as Cr has not stabalized  BMP WNL on 8/21 10.Hyperlipidemia. Lipitor 11.Tobacco abuse. Nicoderm patch. Provide counseling 12. Obesity:   Body mass index is 34.11 kg/m.  Diet and exercise education  Will cont to encourage weight loss to increase endurance and promote overall health   LOS (Days) 12 A FACE TO FACE EVALUATION WAS PERFORMED  Mariem Skolnick [name] Nuala Chiles 02/01/2016 8:45 AM

## 2016-02-01 NOTE — Progress Notes (Signed)
Physical Therapy Session Note  Patient Details  Name: [name] RalphHerman Shores Jr. MRN: [id number] Date of Birth: February 27, 1966  Today's Date: 02/01/2016 PT Individual Time: [id number] PT Individual Time Calculation (min): 56 min    Short Term Goals: Week 2:  PT Short Term Goal 1 (Week 2): LTG = STG due to ELOS.   Skilled Therapeutic Interventions/Progress Updates:    Pt received in recliner & agreeable to PT, denying c/o pain. During session pt ambulated >200 ft with R AFO, mod I & therapist providing manual facilitation for increased weight shift to R. Educated pt on importance of neuro re-ed following stroke and focusing on quality of movement versus quantity. Pt able to complete car (from SUV simulated height) & sit<>stand transfers with mod I during session. Pt able to complete bed mobility (Rolling L<>R, supine<>sit) in apartment bed with mod I. Pt performed stair negotiation on single step without rails, Mod I & recalling proper technique; pt also able to negotiate 12 steps (6") with bilateral and only 1 rail with mod I. Pt performed floor transfer with Mod I with therapist educating pt on situations in which he should call EMS after a fall. Challenged pt's balance by having him stand on bosu ball without UE support. At end of session pt left in room with all needs within reach.   Therapy Documentation Precautions:  Precautions Precautions: None Restrictions Weight Bearing Restrictions: No   See Function Navigator for Current Functional Status.   Therapy/Group: Individual Therapy  Sandi MariscalVictoria M Savina Olshefski 02/01/2016, 5:36 PM

## 2016-02-01 NOTE — Discharge Summary (Signed)
Discharge summary job [id number]

## 2016-02-02 ENCOUNTER — Inpatient Hospital Stay (HOSPITAL_COMMUNITY): Payer: Managed Care, Other (non HMO) | Admitting: Occupational Therapy

## 2016-02-02 LAB — GLUCOSE, CAPILLARY: Glucose-Capillary: 129 mg/dL — ABNORMAL HIGH (ref 65–99)

## 2016-02-02 NOTE — Progress Notes (Signed)
[facility] PHYSICAL MEDICINE & REHABILITATION     PROGRESS NOTE  Subjective/Complaints:  Pt sitting up in his wheelchair, anxious to leave the hospital.      ROS: Denies CP, SOB, N/V/D.  Objective: Vital Signs: Blood pressure 116/64, pulse 67, temperature 98.5 F (36.9 C), temperature source Oral, resp. rate 18, height 5\' 11"  (1.803 m), weight 108 kg (238 lb), SpO2 98 %. No results found.  Recent Labs  01/31/16 1227  WBC 10.3  HGB 14.4  HCT 44.2  PLT 278    Recent Labs  01/31/16 1227  NA 138  K 3.8  CL 101  GLUCOSE 106*  BUN 27*  CREATININE 0.99  CALCIUM 9.8   CBG (last 3)   Recent Labs  02/01/16 1633 02/01/16 2108 02/02/16 0643  GLUCAP 112* 77 129*    Wt Readings from Last 3 Encounters:  02/02/16 108 kg (238 lb)  01/17/16 117.9 kg (260 lb)  01/15/16 117.9 kg (260 lb)    Physical Exam:  BP 116/64 (BP Location: Right Arm)   Pulse 67   Temp 98.5 F (36.9 C) (Oral)   Resp 18   Ht 5\' 11"  (1.803 m) Comment: stated  Wt 108 kg (238 lb)   SpO2 98%   BMI 33.19 kg/m   Constitutional: He appears well-developedand well-nourished. NAD. HENT: Normocephalicand atraumatic.  Eyes: Conjunctivaeand EOMare normal.  Cardiovascular: Normal rateand regular rhythm.  Respiratory: Effort normaland breath sounds normal. No respiratory distress.  GI: Soft. Bowel sounds are normal. He exhibits no distension.  Musculoskeletal: He exhibits no edemaor tenderness.  Neurological: He is alertand oriented.  Dysarthria.  Good awareness of deficits Right hemifacial weakness Motor: RUE: shoulder abduction, elbow flexion/extension 4+/5, wrist extension 4+/5, hand grip 4/5 RLE: Hip flexion 4/5, knee extension 4+/5, ankle dors flexion 4/5, ankle plantar flexion 4+/5 wiggles toes LUE/LLE: 5/5 proximal to distal Skin: Skin is warmand dry.  Psychiatric: He has a normal mood and affect. His behavior is normal. Thought contentnormal  Assessment/Plan: 1. 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 Physiatrist is providing close team supervision and 24 hour management of active medical problems listed below. Physiatrist and rehab team continue to assess barriers to discharge/monitor patient progress toward functional and medical goals.  Function:  Bathing Bathing position   Position: Shower  Bathing parts Body parts bathed by patient: Right arm, Left arm, Chest, Abdomen, Front perineal area, Buttocks, Right upper leg, Left upper leg, Left lower leg, Right lower leg Body parts bathed by helper: Back  Bathing assist Assist Level: More than reasonable time   Set up : To obtain items  Upper Body Dressing/Undressing Upper body dressing   What is the patient wearing?: Pull over shirt/dress     Pull over shirt/dress - Perfomed by patient: Thread/unthread right sleeve, Thread/unthread left sleeve, Put head through opening, Pull shirt over trunk          Upper body assist Assist Level: More than reasonable time   Set up : To obtain clothing/put away  Lower Body Dressing/Undressing Lower body dressing   What is the patient wearing?: Underwear, Pants, Socks, Shoes, AFO Underwear - Performed by patient: Thread/unthread right underwear leg, Thread/unthread left underwear leg, Pull underwear up/down Underwear - Performed by helper: Pull underwear up/down, Thread/unthread right underwear leg Pants- Performed by patient: Thread/unthread right pants leg, Pull pants up/down, Thread/unthread left pants leg,  Fasten/unfasten pants Pants- Performed by helper: Thread/unthread right pants leg, Pull pants up/down     Socks - Performed by patient: Don/doff right sock, Don/doff left sock Socks - Performed by helper: Don/doff left sock, Don/doff right sock Shoes - Performed by patient: Don/doff right shoe, Don/doff  left shoe, Fasten right, Fasten left Shoes - Performed by helper: Don/doff right shoe, Fasten right AFO - Performed by patient: Don/doff right AFO AFO - Performed by helper: Don/doff right AFO      Lower body assist Assist for lower body dressing: No Help, No cues      Toileting Toileting   Toileting steps completed by patient: Adjust clothing prior to toileting, Performs perineal hygiene, Adjust clothing after toileting   Toileting Assistive Devices: Grab bar or rail  Toileting assist Assist level: No help/no cues   Transfers Chair/bed transfer   Chair/bed transfer method: Ambulatory Chair/bed transfer assist level: No Help, no cues, assistive device, takes more than a reasonable amount of time Chair/bed transfer assistive device: Orthosis     Locomotion Ambulation     Max distance: 150 ft Assist level: No help, No cues, assistive device, takes more than a reasonable amount of time   Wheelchair Wheelchair activity did not occur: N/A Type: Manual Max wheelchair distance: 16050ft Assist Level: Supervision or verbal cues  Cognition Comprehension Comprehension assist level: Follows complex conversation/direction with no assist  Expression Expression assist level: Expresses complex ideas: With no assist  Social Interaction Social Interaction assist level: Interacts appropriately with others - No medications needed.  Problem Solving Problem solving assist level: Solves complex problems: Recognizes & self-corrects  Memory Memory assist level: Complete Independence: No helper    Medical Problem List and Plan: 1. 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  Dc today  Will see pt in 1-2 weeks for transitional care management  Fluoxetine started on 8/16, increased 8/22 2. DVT Prophylaxis/Anticoagulation: Subcutaneous Lovenox. Monitor platelet counts of any signs of bleeding 3. Pain Management:  Tylenol as needed 4. Hypertension.  Vitals:   02/01/16 1244 02/02/16 0502  BP: 134/81 116/64  Pulse: 81 67  Resp: 17 18  Temp: 98.9 F (37.2 C) 98.5 F (36.9 C)    HCTZ 12.5 mg daily, changed to 25 mg on 8/16, changed to chlorthalidone 25 on 8/17, increased to 50 reduced to 25mg  on 8/21  Monitor with increased mobility  Lisinopril 20 mg daily increased to 40 on 8/14  Improving, Will cont to monitor 5. Neuropsych: This patient iscapable of making decisions on hisown behalf. 6. Skin/Wound Care: Routine skin checks 7. Fluids/Electrolytes/Nutrition: Routine I&O's  8. Newly diagnosed diabetes mellitus. Hemoglobin A1c 8.5.   Check blood sugars before meals and at bedtime.   Provide diabetic teaching.  Metformin started 8/11, increased to BID on 8/14, increased to 850mg  BID on 8/16- increased to 1000 BID on 8/21 CBG (last 3)   Recent Labs  02/01/16 1633 02/01/16 2108 02/02/16 0643  GLUCAP 112* 77 129*    Will cont to monitor, improving 9.?Chronic renal insufficiency, likely AKI as Cr has not stabalized  BMP WNL on 8/21 10.Hyperlipidemia. Lipitor 11.Tobacco abuse. Nicoderm patch. Provide counseling 12. Obesity:   Body mass index is 33.19 kg/m.  Diet and exercise education  Will cont to encourage weight loss to increase endurance and promote overall health   LOS (Days) 13 A FACE TO FACE EVALUATION WAS PERFORMED  [name] 02/02/2016 8:18 AM

## 2016-02-02 NOTE — Progress Notes (Signed)
Patient discharged home.  Left floor via wheelchair, escorted by nursing staff and family.  All patient belongings sent with patient, including DME.  Patient verbalized understanding of discharge instructions as given by Deatra Inaan Angiulli, PA.  Appears to be in no immediate distress at this time.  Dani Gobbleeardon, Johnchristopher Sarvis J, RN

## 2016-02-07 ENCOUNTER — Ambulatory Visit: Payer: Managed Care, Other (non HMO) | Attending: Neuromuscular Medicine | Admitting: Physical Therapy

## 2016-02-07 ENCOUNTER — Ambulatory Visit: Payer: Managed Care, Other (non HMO) | Admitting: *Deleted

## 2016-02-07 ENCOUNTER — Encounter: Payer: Self-pay | Admitting: *Deleted

## 2016-02-07 DIAGNOSIS — R278 Other lack of coordination: Secondary | ICD-10-CM

## 2016-02-07 DIAGNOSIS — M6281 Muscle weakness (generalized): Secondary | ICD-10-CM | POA: Diagnosis present

## 2016-02-07 DIAGNOSIS — R2681 Unsteadiness on feet: Secondary | ICD-10-CM

## 2016-02-07 DIAGNOSIS — I69351 Hemiplegia and hemiparesis following cerebral infarction affecting right dominant side: Secondary | ICD-10-CM | POA: Diagnosis not present

## 2016-02-07 DIAGNOSIS — R2689 Other abnormalities of gait and mobility: Secondary | ICD-10-CM | POA: Diagnosis present

## 2016-02-07 NOTE — Therapy (Signed)
[facility] [address] [place], [place], [address] Phone: [phone]   Fax:  [phone]  Occupational Therapy Evaluation  Patient Details  Name: [name] RalphHerman Calender Jr. MRN: [id number] Date of Birth: 28-Oct-1965 Referring Provider: Dr Donnel SaxonAshok Patel  Encounter Date: 02/07/2016      OT End of Session - 02/07/16 1146    Visit Number 1   Number of Visits 12   Date for OT Re-Evaluation 04/03/16   OT Start Time 1100   OT Stop Time 1144   OT Time Calculation (min) 44 min   Activity Tolerance Patient tolerated treatment well   Behavior During Therapy [facility] for tasks assessed/performed      Past Medical History:  Diagnosis Date  . Allergy   . Hernia, umbilical   . Hyperlipidemia   . Hypertension     Past Surgical History:  Procedure Laterality Date  . UVULOPALATOPHARYNGOPLASTY    . VARICOSE VEIN SURGERY      There were no vitals filed for this visit.      Subjective Assessment - 02/07/16 1106    Subjective  Pt reports that he had CVA like symptoms with right sided weakness on 01/14/16 and was admitted to hospital released on 8/57 b/c symptoms had resolved. He reports that his symptoms returned and "It really hit me" on 11/16/15, he was admitted to hospital on 01/17/16 amd was on in-pt rehab and d/c on 02/02/16.  Pt is RHD    Patient is accompained by: Family member  Neighbor drove pt here today.   Pertinent History See EPIC history.    Patient Stated Goals Pt states that current goals are improve fine motor coordination, strength RUE   Currently in Pain? No/denies   Pain Score 0-No pain           [facility] OT Assessment - 02/07/16 0001      Assessment   Diagnosis CVA with right sided weakness   Referring Provider Dr Ammie DaltonA Patel   Onset Date 01/14/16   Prior Therapy Pt had therapy as in-pt      Precautions   Precautions Other (comment)  No driving   Required Braces or Orthoses --  Has AFO, "I don't wear it"     Balance Screen   Has the patient fallen in the past 6 months No   Has the patient had a decrease in activity level because of a fear of falling?  No   Is the patient reluctant to leave their home because of a fear of falling?  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 His balance is off a little but "getting betterEducational psychologist"   Financial Management Manages financial matters independently (budgets, writes checks, pays rent, bills goes  to bank), collects and keeps track of income  Pt states spouse is doing some, he hasn't noticed     Written Expression   Dominant Hand Right   Handwriting 90% legible;Increased time   Written Experience --  slow at handwriting, increased time & signature "not normal"     Vision - History   Baseline Vision Wears glasses all the time   Additional Comments Pt denies changes with vision     Vision Assessment   Eye Alignment Within Functional Limits   Visual Fields No apparent deficits   Comment Cont to assess in functional context     Activity Tolerance   Activity Tolerance Tolerate 30+ min activity without fatigue   Activity Tolerance Comments Pt reports that he is performing activity for ~60 min at a time. He noteices that he is taking frequent naps or  rest breaks after about an hour.     Cognition   Overall Cognitive Status No family/caregiver present to determine baseline cognitive functioning  Pt reports WFL's     Sensation   Light Touch Appears Intact     Coordination   9 Hole Peg Test Right;Left   Right 9 Hole Peg Test 56.82 seconds   Left 9 Hole Peg Test 25.54 seconds   Coordination Impaired on R dominant hand     Edema   Edema Min edema noted right hand as visually compared to left     ROM / Strength   AROM / PROM / Strength AROM;Strength     AROM   Overall AROM Comments Impaired end range shoulder flexion right. Impaired supination. Shoulder hike noted with overhead reach/compensation.   Right Shoulder Flexion 120 Degrees   Right Elbow Flexion 120     Strength   Right Shoulder Flexion 3-/5   Right Elbow Flexion 3-/5   Right Forearm Supination 3-/5   Right Hand Gross Grasp Impaired     Hand Function   Right Hand Grip (lbs) 37   Right Hand Lateral Pinch 17 lbs   Right Hand 3 Point Pinch 14.5 lbs   Left Hand Grip (lbs) 102   Left Hand Lateral Pinch 27 lbs   Left 3 point pinch 25.5 lbs                         OT Education - 02/07/16 1145    Education provided Yes   Education Details Discussed outcome of OT assessment and recommendations for FM/coordination, strengthening; shoulder stabilization   Person(s) Educated Patient   Methods Explanation;Demonstration;Verbal cues   Comprehension Verbalized understanding;Need further instruction          OT Short Term Goals - 02/07/16 1153      OT SHORT TERM GOAL #1   Title Pt will be I signs and symptoms CVA/Stroke awareness   Time 3   Period Weeks   Status New     OT SHORT TERM GOAL #2   Title Pt will be Mod I HEP RUE for coordinationa and strengthening   Time 3   Period Weeks   Status New           OT Long Term Goals - 02/07/16 1154      OT LONG TERM GOAL #1   Title Pt will be Mod I upgraded HEP for coordination and strength  RUE   Time 6   Period Weeks   Status New     OT LONG TERM GOAL #2   Title Pt will  demonstrate improved coordinaiton RUE as seen by improved 9 hole peg score by 15 seconds or more   Baseline 56.85 seconds   Time 6   Period Weeks   Status New     OT LONG TERM GOAL #3   Title Pt will demonstrate improved grip strength RUE as seen by JAMAR grip of 50# or greater R hand   Baseline 37# (RUE position 2)   Time 6   Period Weeks   Status New     OT LONG TERM GOAL #4   Title Pt will demonstrate gross RUE/shoulder strength of 4/5 or greater as seen by MMT   Baseline -3/5 RUE   Time 6   Period Weeks   Status New     OT LONG TERM GOAL #5   Title Pt will demonstrate improved coordination as seen by pt reports of typing, texting and signing his name WFL's   Time 6   Period Weeks   Status New               Plan - 02/07/16 1148    Clinical Impression Statement Pt is a 50 y/o right hand dominant male s/p CVA with deficits in RUE strength, AROM, grip, pinch and fine motor coordination. He should benefit from out-pt OT to addess deficits in coordinaiton, strength, provide HEP, neuromuscular re-education and pt education. Pt has reported "I won't do anything that I see no need to do" when asked about putty and specific ex's that as suggested in clinic today.   Rehab Potential Good   Clinical Impairments Affecting Rehab Potential Pt indicated that he "won't do anything" related to HEP "that I see no reason to do"    OT Frequency 2x / week   OT Duration 6 weeks   OT Treatment/Interventions Self-care/ADL training;Therapeutic exercise;Neuromuscular education;Fluidtherapy;Energy conservation;Building services engineer;Therapeutic exercises;Patient/family education;Balance training;Therapeutic activities;Manual Therapy;DME and/or AE instruction   Plan Issue HEP for coordination and shoulder stabilization/AROM RUE   Consulted and Agree with Plan of Care Patient      Patient will benefit  from skilled therapeutic intervention in order to improve the following deficits and impairments:  Decreased balance, Decreased endurance, Decreased mobility, Increased edema, Decreased range of motion, Decreased knowledge of precautions, Decreased activity tolerance, Decreased coordination, Decreased knowledge of use of DME, Impaired flexibility, Impaired UE functional use  Visit Diagnosis: Muscle weakness (generalized) - Plan: Ot plan of care cert/re-cert  Other lack of coordination - Plan: Ot plan of care cert/re-cert    Problem List Patient Active Problem List   Diagnosis Date Noted  . Diabetes mellitus type 2 in obese (HCC)   . Obesity   . Right foot drop   . Other vascular headache   . 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 Benign essential HTN   . Cerebral thrombosis with cerebral infarction 01/17/2016  . Type 2 diabetes mellitus with hyperglycemia, without long-term current use of insulin (HCC) 01/17/2016  . Stroke (HCC) 01/17/2016  . Cerebral infarction due to unspecified mechanism 01/15/2016  . Cerebrovascular accident (CVA) due to thrombosis of basilar artery (HCC)   . HLD (hyperlipidemia)   . Tobacco use disorder   . Stroke-like symptoms 01/14/2016  . Acute right-sided weakness 01/14/2016  . AKI (acute kidney injury) (HCC) 01/14/2016  . Accelerated hypertension 01/14/2016  . Right hemiparesis (HCC) 01/14/2016    Jacara Benito Dionicio Stall, OTR/L 02/07/2016, 12:03 PM  [facility] [address] [place], [place], [address] Phone: [phone]  Fax:  [phone]  Name: Khalid Lacko. MRN: [id number] Date of Birth: May 13, 1966

## 2016-02-07 NOTE — Patient Instructions (Signed)
Gastroc / Heel Cord Stretch - On Step    Stand with heels over edge of stair. Holding rail, lower heels until stretch is felt in calf of legs. Repeat __2_ times. Do _2__ times per day.   HOLD 30 -60 secs  Copyright  VHI. All rights reserved.  Heel Raises    Stand with support. Tighten pelvic floor and hold. With knees straight, raise heels off ground. Hold __3-5_ seconds. Relax for _5__ seconds. Repeat _10-15_ times. Do __3_ times a day.  Copyright  VHI. All rights reserved.  (Clinic) Knee: Flexion / Hamstring Curl - Prone    Pulley beyond feet, lie with strap around left ankle. Pull foot toward buttocks. Repeat __10__ times per set. Do _3__ sets per session. Do __5__ sessions per week. Use green theraband   Copyright  VHI. All rights reserved.  Knee Flexion: Resisted (Sitting)    Sit with band under left foot and looped around ankle of supported leg. Pull unsupported leg back. Repeat _10-15___ times per set. Do __3__ sets per session. Do __1-2Dorsiflexion: Resisted    Facing anchor, tubing around left foot, pull toward face.  Repeat __10-15_ times per set. Do _3___ sets per session. Do  1-2___ sessions per day.  http://orth.exer.us/8     http://orth.exer.us/694   Copyright  VHI. All rights reserved.

## 2016-02-09 ENCOUNTER — Telehealth: Payer: Self-pay | Admitting: Physical Medicine & Rehabilitation

## 2016-02-09 NOTE — Telephone Encounter (Signed)
Patient was just released from hospital, and has questions about his diabetic medication.  His blood sugar was 103 before he took his medication and wants to know if he should wait and take his medication after he eats.  Please call patient.

## 2016-02-09 NOTE — Telephone Encounter (Signed)
Pt needs to follow up with his PCP regarding chronic medical issues.  From chart review, he is only taking metformin and can continue to do so, however, his PCP will need to make adjustments to his DM meds.

## 2016-02-09 NOTE — Therapy (Signed)
[facility] [address] [place], [place], [address] Phone: [phone]   Fax:  [phone]  Physical Therapy Evaluation  Patient Details  Name: [name] RalphHerman Vida Jr. MRN: [id number] Date of Birth: [birth year] Referring Provider: Dr. Donnel SaxonAshok Patel  Encounter Date: 02/07/2016      PT End of Session - 02/09/16 0936    Visit Number 1   Number of Visits 9   Date for PT Re-Evaluation 03/09/16   Authorization Type [employer]   PT Start Time 1147   PT Stop Time 1236   PT Time Calculation (min) 49 min      Past Medical History:  Diagnosis Date  . Allergy   . Hernia, umbilical   . Hyperlipidemia   . Hypertension     Past Surgical History:  Procedure Laterality Date  . UVULOPALATOPHARYNGOPLASTY    . VARICOSE VEIN SURGERY      There were no vitals filed for this visit.       Subjective Assessment - 02/09/16 0915    Subjective Pt states "everything is coming back" - has gotten a lot of strength return in his R arm and leg; has been working on his walking pattern by lifting his R leg instead of swinging leg out to side   Pertinent History DM Type 2: HTN:  cerebral infarction due to thrombosis of basilar artery; acute kidney injury   Patient Stated Goals Increase strength in R leg and increase stamina; wants to return to work   Currently in Pain? No/denies            [facility] PT Assessment - 02/09/16 0001      Assessment   Medical Diagnosis L CVA   Referring Provider Dr. Donnel SaxonAshok Patel   Onset Date/Surgical Date 01/14/16   Prior Therapy inpatient rehab 01-19-21-17     Precautions   Precautions Other (comment)     Prior Function   Level of Independence Independent   Vocation Full time employment     Strength   Right Hip Flexion 4/5   Left Hip Flexion 5/5   Right Knee Flexion 4/5   Right Knee Extension 5/5   Left Knee Flexion 5/5   Left Knee Extension 5/5   Right Ankle Dorsiflexion 3-/5   Left Ankle  Dorsiflexion 5/5     Ambulation/Gait   Ambulation/Gait Yes   Ambulation/Gait Assistance 6: Modified independent (Device/Increase time)   Ambulation Distance (Feet) 200 Feet   Assistive device None   Gait Pattern Decreased hip/knee flexion - right;Right circumduction;Step-through pattern  Decr. dorsiflexion R foot and decr. push off in stance   Ambulation Surface Level;Indoor   Gait velocity 9.2 secs = 3.57 ft/sec   Stairs Yes   Stairs Assistance 6: Modified independent (Device/Increase time)   Stair Management Technique One rail Left;Alternating pattern   Number of Stairs 4   Height of Stairs 6     Standardized Balance Assessment   Standardized Balance Assessment Berg Balance Test;Timed Up and Go Test     Berg Balance Test   Sit to Stand Able to stand without using hands and stabilize independently   Standing Unsupported Able to stand safely 2 minutes   Sitting with Back Unsupported but Feet Supported on Floor or Stool Able to sit safely and securely 2 minutes   Stand to Sit Sits safely with minimal use of hands   Transfers Able to transfer safely, minor use of hands   Standing Unsupported with Eyes Closed Able to stand 10 seconds  with supervision   Standing Ubsupported with Feet Together Able to place feet together independently and stand 1 minute safely   From Standing, Reach Forward with Outstretched Arm Can reach confidently >25 cm (10")   From Standing Position, Pick up Object from Floor Able to pick up shoe safely and easily   From Standing Position, Turn to Look Behind Over each Shoulder Looks behind from both sides and weight shifts well   Turn 360 Degrees Able to turn 360 degrees safely in 4 seconds or less   Standing Unsupported, Alternately Place Feet on Step/Stool Able to stand independently and safely and complete 8 steps in 20 seconds   Standing Unsupported, One Foot in Front Able to place foot tandem independently and hold 30 seconds   Standing on One Leg Tries to  lift leg/unable to hold 3 seconds but remains standing independently   Total Score 52     Timed Up and Go Test   Normal TUG (seconds) 10.25     R plantarflexion strength 3-/5; R evertors 3+/5                      PT Education - 02/09/16 0934    Education provided Yes   Education Details Gave resisted dorsiflexion and knee flexion with green theraband for RLE strengthening and R heel cord stretch   Person(s) Educated Patient   Methods Explanation;Demonstration;Handout   Comprehension Verbalized understanding;Returned demonstration             PT Long Term Goals - 02/09/16 0944      PT LONG TERM GOAL #1   Title Pt will amb. 1000' on all surfaces modified independently with minimal to no c/o fatigue.  (03-09-16)   Time 4   Period Weeks   Status New     PT LONG TERM GOAL #2   Title Pt will subjectively report at least 50% improvement in endurance/activity tolerance.  (03-09-16)   Time 4   Period Weeks   Status New     PT LONG TERM GOAL #3   Title Negotiate steps without rail using step over step sequence to demo improved balance.  (03-09-16)   Time 4   Period Weeks   Status New     PT LONG TERM GOAL #4   Title Amb. 200' on flat,even surface with minimal R hip circumduction in swing phase of gait.  (03-09-16)   Time 4   Period Weeks   Status New     PT LONG TERM GOAL #5   Title Increase R SLS to >/= 5 secs to demo improved balance.  (03-09-16)   Time 4   Period Weeks   Status New     Additional Long Term Goals   Additional Long Term Goals Yes     PT LONG TERM GOAL #6   Title Independent in HEP for RLE strengthening.  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  Pt went to ED on 01-14-16 with stroke-like symptoms and was discharged with symptoms resolved.  Pt states stroke extended and he  went back to ED on 01-17-16 with R sided weakness.  Pt was admitted to inpatient rehab on 8-10 and discharged home on 02-01-16.  Pt is no  longer wearing AFO on RLE and is not using assistive device.  Pt presents with RLE weakness, high level balance deficits, decreased endurance and gait deviations.  PMH includes HTN and Type 2 DM.  Pt is also receiving OT services to address RUE deficits.   Rehab Potential Good   PT Frequency 2x / week   PT Duration 4 weeks   PT Treatment/Interventions ADLs/Self Care Home Management;Therapeutic activities;Stair training;Gait training;Therapeutic exercise;Balance training;Neuromuscular re-education;Patient/family education;Orthotic Fit/Training   PT Next Visit Plan check HEP given on 02-07-16; cont with RLE strengthening and endurance activities   PT Home Exercise Plan see pt instructions    Consulted and Agree with Plan of Care Patient      Patient will benefit from skilled therapeutic intervention in order to improve the following deficits and impairments:  Abnormal gait, Decreased balance, Decreased activity tolerance, Decreased endurance, Decreased range of motion, Impaired flexibility, Decreased strength  Visit Diagnosis: Hemiplegia and hemiparesis following cerebral infarction affecting right dominant side (HCC)  Other abnormalities of gait and mobility  Unsteadiness on feet  Muscle weakness (generalized)     Problem List Patient Active Problem List   Diagnosis Date Noted  . Diabetes mellitus type 2 in obese (HCC)   . Obesity   . Right foot drop   . Other vascular headache   . 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 Benign essential HTN   . Cerebral thrombosis with cerebral infarction 01/17/2016  . Type 2 diabetes mellitus with hyperglycemia, without long-term current use of insulin (HCC) 01/17/2016  . Stroke (HCC) 01/17/2016  . Cerebral infarction due to unspecified mechanism 01/15/2016  . 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 HLD (hyperlipidemia)   . Tobacco use disorder   . Stroke-like symptoms 01/14/2016  . Acute right-sided weakness 01/14/2016  . AKI (acute kidney injury) (HCC) 01/14/2016  . Accelerated hypertension 01/14/2016  . Right hemiparesis (HCC) 01/14/2016    Lido Maske, Donavan Burnet, PT 02/09/2016, 9:57 AM  [facility] [address] [place], [place], [address] Phone: [phone]   Fax:  [phone]  Name: [name] [name]. MRN: [id number] Date of Birth: 01-14-1966

## 2016-02-10 NOTE — Telephone Encounter (Signed)
Called mobile number on file, advised pt to call PCP.

## 2016-02-10 NOTE — Telephone Encounter (Signed)
Tried calling pt to advise. Call could not go through as dialed.

## 2016-02-11 ENCOUNTER — Encounter
Payer: Managed Care, Other (non HMO) | Attending: Physical Medicine & Rehabilitation | Admitting: Physical Medicine & Rehabilitation

## 2016-02-11 ENCOUNTER — Encounter: Payer: Self-pay | Admitting: Physical Medicine & Rehabilitation

## 2016-02-11 VITALS — BP 125/75 | HR 97 | Resp 16

## 2016-02-11 DIAGNOSIS — Z9889 Other specified postprocedural states: Secondary | ICD-10-CM | POA: Diagnosis not present

## 2016-02-11 DIAGNOSIS — E1165 Type 2 diabetes mellitus with hyperglycemia: Secondary | ICD-10-CM

## 2016-02-11 DIAGNOSIS — I10 Essential (primary) hypertension: Secondary | ICD-10-CM | POA: Diagnosis not present

## 2016-02-11 DIAGNOSIS — I6302 Cerebral infarction due to thrombosis of basilar artery: Secondary | ICD-10-CM

## 2016-02-11 DIAGNOSIS — Z720 Tobacco use: Secondary | ICD-10-CM

## 2016-02-11 DIAGNOSIS — I69351 Hemiplegia and hemiparesis following cerebral infarction affecting right dominant side: Secondary | ICD-10-CM | POA: Diagnosis present

## 2016-02-11 DIAGNOSIS — G8191 Hemiplegia, unspecified affecting right dominant side: Secondary | ICD-10-CM | POA: Diagnosis not present

## 2016-02-11 DIAGNOSIS — E119 Type 2 diabetes mellitus without complications: Secondary | ICD-10-CM | POA: Diagnosis not present

## 2016-02-11 DIAGNOSIS — Z716 Tobacco abuse counseling: Secondary | ICD-10-CM

## 2016-02-11 DIAGNOSIS — Z8249 Family history of ischemic heart disease and other diseases of the circulatory system: Secondary | ICD-10-CM | POA: Diagnosis not present

## 2016-02-11 NOTE — Progress Notes (Addendum)
Subjective:    Patient ID: [name] RalphHerman Cravens Jr., male    DOB: Nov 17, 1965, 50 y.o.   MRN: [id number]  HPI  50 year old right-handed male with history of hypertension, tobacco abuse, chronic renal insufficiency presents for hospital follow up after being discharged from CIR for left pontine infarct with evolution secondary to small vessel disease on 01/17/16. He states he has an appointment to see Neurology.  She has been taking his blood pressure meds, he is adjusting the doses on his own.  His CBGs have been ~98-110. Pt states he does not need to see a dietitian because they are taking care of it.  Pt notes increase in fatigue.  Pt was instructed not to drive, but he has not been complaint.   Pain Inventory Average Pain 2 Pain Right Now 2 My pain is burning, dull and aching  In the last 24 hours, has pain interfered with the following? General activity 0 Relation with others 0 Enjoyment of life 0 What TIME of day is your pain at its worst? night Sleep (in general) Good  Pain is worse with: inactivity Pain improves with: rest Relief from Meds: no pain med needed  Mobility walk without assistance how many minutes can you walk? . do you drive?  yes (knows) he has not been released to drive  Function I need assistance with the following:  meal prep and household duties  Neuro/Psych weakness dizziness  Prior Studies Any changes since last visit?  no  Physicians involved in your care Any changes since last visit?  no   Family History  Problem Relation Age of Onset  . Heart attack Father    Social History   Social History  . Marital status: Married    Spouse name: N/A  . Number of children: N/A  . Years of education: N/A   Social History Main Topics  . Smoking status: Current Every Day Smoker    Packs/day: 2.00  . Smokeless tobacco: Never Used  . Alcohol use Yes  . Drug use: No  . Sexual activity: Yes   Other Topics Concern  . None   Social History Narrative    . None   Past Surgical History:  Procedure Laterality Date  . UVULOPALATOPHARYNGOPLASTY    . VARICOSE VEIN SURGERY     Past Medical History:  Diagnosis Date  . Allergy   . Hernia, umbilical   . Hyperlipidemia   . Hypertension    BP 125/75 (BP Location: Left Arm, Patient Position: Sitting, Cuff Size: Large)   Pulse 97   Resp 16   SpO2 95%   Opioid Risk Score:   Fall Risk Score:  `1  Depression screen PHQ 2/9  Depression screen PHQ 2/9 02/11/2016  Decreased Interest 0  Down, Depressed, Hopeless 0  PHQ - 2 Score 0   Review of Systems  Musculoskeletal: Positive for gait problem.  Neurological: Positive for weakness.  All other systems reviewed and are negative.     Objective:   Physical Exam Constitutional: He appears well-developed and well-nourished. NAD. HENT: Normocephalic and atraumatic.  Eyes: Conjunctivae and EOM are normal.  Cardiovascular: Normal rate and regular rhythm.   Respiratory: Effort normal and breath sounds normal. No respiratory distress.  GI: Soft. Bowel sounds are normal. He exhibits no distension.  Musculoskeletal: He exhibits no edema or tenderness.  Gait: Right sided limp Neurological: He is alert and oriented.  No dysarthria.  Good awareness of deficits Right hemifacial weakness Motor: RUE: shoulder abduction, elbow flexion/extension 4+/5,  wrist extension 4+/5, hand grip 4+/5 RLE: Hip flexion 4+/5, knee extension 4+/5, ankle dorsi flexion 4+/5, ankle plantar flexion  LUE/LLE: 5/5 proximal to distal  Skin: Skin is warm and dry.  Psychiatric: Atypical affect. Impulsive.     Assessment & Plan:  50 year old right-handed male with history of hypertension, tobacco abuse, chronic renal insufficiency presents for hospital follow up after being discharged from CIR for left pontine infarct with evolution secondary to small vessel disease on 01/17/16.  1.  Right hemiplegia secondary to left pontine infarct with evolution secondary to small vessel  disease on 01/17/16.  Cont Fluoxetine  Cont meds  2. Hypertension.   Cont meds  Follow up with PCP for further BP monitoring and changed.   3. Newly diagnosed diabetes mellitus.   Hemoglobin A1c 8.5.   Cont meds, appears to be under good control at present  Follow up with PCP regarding further adjustment.   4.  Tobacco abuse:  Pt's wife notes he has stopped his patches and resumed smoking  Educated on importance of tobacco cessation and association with stroke

## 2016-02-15 ENCOUNTER — Ambulatory Visit: Payer: Managed Care, Other (non HMO) | Admitting: Physical Therapy

## 2016-02-15 ENCOUNTER — Ambulatory Visit: Payer: Managed Care, Other (non HMO) | Attending: Neuromuscular Medicine | Admitting: Occupational Therapy

## 2016-02-15 DIAGNOSIS — M6281 Muscle weakness (generalized): Secondary | ICD-10-CM | POA: Diagnosis present

## 2016-02-15 DIAGNOSIS — I69351 Hemiplegia and hemiparesis following cerebral infarction affecting right dominant side: Secondary | ICD-10-CM | POA: Insufficient documentation

## 2016-02-15 DIAGNOSIS — R278 Other lack of coordination: Secondary | ICD-10-CM | POA: Insufficient documentation

## 2016-02-15 DIAGNOSIS — R2689 Other abnormalities of gait and mobility: Secondary | ICD-10-CM

## 2016-02-15 NOTE — Patient Instructions (Signed)
   Coordination Activities  Perform the following activities for 10 minutes 2 times per day with right hand(s).   Rotate ball in fingertips (clockwise and counter-clockwise).  Toss ball in air and catch with the same hand.  Flip cards 1 at a time as fast as you can.  Deal cards with your thumb (Hold deck in hand and push card off top with thumb).  Rotate card in hand (clockwise and counter-clockwise).  Pick up coins one at a time until you get 5-10 in your hand, then move coins from palm to fingertips to stack one at a time.  Practice writing and/or typing.  Screw together nuts and bolts, then unfasten.   1. Grip Strengthening (Resistive Putty)   Squeeze putty using thumb and all fingers. Repeat _20___ times. Do __2__ sessions per day.   2. Lateral Pinch Strengthening (Resistive Putty)    Squeeze between thumb and side of each finger in turn. Repeat _10___ times. Do __2__ sessions per day.    3. Roll putty into tube on table and pinch between each finger and thumb x 10 reps each. (Do ring and small finger together)

## 2016-02-15 NOTE — Therapy (Signed)
[facility] [address] [place], [place], [address] Phone: [phone]   Fax:  [phone]  Occupational Therapy Treatment  Patient Details  Name: [name] [name]. MRN: [id number] Date of Birth: 02-18-1966 Referring Provider: Dr Ammie Dalton  Encounter Date: 02/15/2016      OT End of Session - 02/15/16 1015    Visit Number 2   Number of Visits 12   Date for OT Re-Evaluation 04/03/16   Authorization Type AET[employer] Managed   OT Start Time 0800   OT Stop Time 0845   OT Time Calculation (min) 45 min   Activity Tolerance Patient tolerated treatment well      Past Medical History:  Diagnosis Date  . Allergy   . Hernia, umbilical   . Hyperlipidemia   . Hypertension     Past Surgical History:  Procedure Laterality Date  . UVULOPALATOPHARYNGOPLASTY    . VARICOSE VEIN SURGERY      There were no vitals filed for this visit.      Subjective Assessment - 02/15/16 0806    Pertinent History See EPIC history.    Patient Stated Goals Pt states that current goals are improve fine motor coordination, strength RUE   Currently in Pain? Yes   Pain Score 5    Pain Location Shoulder   Pain Orientation Right   Pain Descriptors / Indicators Sore   Pain Type Acute pain   Pain Frequency Intermittent   Aggravating Factors  sleeping on it wrong, using it   Pain Relieving Factors rest            [facility] OT Assessment - 02/15/16 0001      Hand Function   Right Hand Grip (lbs) 49                  OT Treatments/Exercises (OP) - 02/15/16 0001      Exercises   Exercises Hand     Hand Exercises   Other Hand Exercises see pt instructions for putty HEP. Issued green putty     Fine Motor Coordination   Other Fine Motor Exercises See pt instructions for coordination HEP. Pt also issued Rt handed typing words                OT Education - 02/15/16 0836    Education provided Yes   Education Details Putty  and coordination HEP    Person(s) Educated Patient   Methods Explanation;Demonstration;Handout   Comprehension Verbalized understanding;Returned demonstration          OT Short Term Goals - 02/07/16 1153      OT SHORT TERM GOAL #1   Title Pt will be I signs and symptoms CVA/Stroke awareness   Time 3   Period Weeks   Status New     OT SHORT TERM GOAL #2   Title Pt will be Mod I HEP RUE for coordinationa and strengthening   Time 3   Period Weeks   Status New           OT Long Term Goals - 02/07/16 1154      OT LONG TERM GOAL #1   Title Pt will be Mod I upgraded HEP for coordination and strength RUE   Time 6   Period Weeks   Status New     OT LONG TERM GOAL #2   Title Pt will demonstrate improved coordinaiton RUE as seen by improved 9 hole peg score by 15 seconds or more  Baseline 56.85 seconds   Time 6   Period Weeks   Status New     OT LONG TERM GOAL #3   Title Pt will demonstrate improved grip strength RUE as seen by JAMAR grip of 50# or greater R hand   Baseline 37# (RUE position 2)   Time 6   Period Weeks   Status New     OT LONG TERM GOAL #4   Title Pt will demonstrate gross RUE/shoulder strength of 4/5 or greater as seen by MMT   Baseline -3/5 RUE   Time 6   Period Weeks   Status New     OT LONG TERM GOAL #5   Title Pt will demonstrate improved coordination as seen by pt reports of typing, texting and signing his name WFL's   Time 6   Period Weeks   Status New               Plan - 02/15/16 1024    Clinical Impression Statement Pt demo increased grip strength and use of Rt hand, but resistant to do coordination and putty HEP (this is "silly") until therapist explained reasoning behind exercises.    Rehab Potential Good   Clinical Impairments Affecting Rehab Potential Pt indicated that he "won't do anything" related to HEP "that I see no reason to do"    OT Frequency 2x / week   OT Duration 6 weeks   OT Treatment/Interventions  Self-care/ADL training;Therapeutic exercise;Neuromuscular education;Fluidtherapy;Energy conservation;Building services engineerunctional Mobility Training;Therapeutic exercises;Patient/family education;Balance training;Therapeutic activities;Manual Therapy;DME and/or AE instruction   Plan Shoulder HEP, multi-tasking with environmental scanning (pt wants to return to driving)    Consulted and Agree with Plan of Care Patient      Patient will benefit from skilled therapeutic intervention in order to improve the following deficits and impairments:  Decreased balance, Decreased endurance, Decreased mobility, Increased edema, Decreased range of motion, Decreased knowledge of precautions, Decreased activity tolerance, Decreased coordination, Decreased knowledge of use of DME, Impaired flexibility, Impaired UE functional use  Visit Diagnosis: Muscle weakness (generalized)  Other lack of coordination  Hemiplegia and hemiparesis following cerebral infarction affecting right dominant side [facility](HCC)    Problem List Patient Active Problem List   Diagnosis Date Noted  . Diabetes mellitus type 2 in obese (HCC)   . Obesity   . Right foot drop   . Other vascular headache   . 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 Benign essential HTN   . Cerebral thrombosis with cerebral infarction 01/17/2016  . Type 2 diabetes mellitus with hyperglycemia, without long-term current use of insulin (HCC) 01/17/2016  . Stroke (HCC) 01/17/2016  . Cerebral infarction due to unspecified mechanism 01/15/2016  . Cerebrovascular accident (CVA) due to thrombosis of basilar artery (HCC)   . HLD (hyperlipidemia)   . Tobacco use disorder   . Stroke-like symptoms 01/14/2016  . Acute right-sided weakness 01/14/2016  . AKI (acute kidney injury) (HCC) 01/14/2016  . Accelerated hypertension 01/14/2016  . Right hemiparesis (HCC) 01/14/2016    Kelli ChurnBallie, Tyresa Prindiville Johnson, OTR/L 02/15/2016, 10:32 AM  [facility] [address] [place], [place], [address] Phone: [phone]   Fax:  [phone]  Name: [name] RalphHerman Ebarb Jr. MRN: [id number] Date of Birth: 11-27-1965

## 2016-02-15 NOTE — Therapy (Signed)
[facility] [address] [place], [place], [address] Phone: [phone]   Fax:  [phone]  Physical Therapy Treatment  Patient Details  Name: [name] RalphHerman Lazare Jr. MRN: [id number] Date of Birth: July 10, 1965 Referring Provider: Dr. Donnel SaxonAshok Patel  Encounter Date: 02/15/2016      PT End of Session - 02/15/16 1613    Visit Number 2   Number of Visits 9   Date for PT Re-Evaluation 03/09/16   Authorization Type Aetna   PT Start Time [phone]   PT Stop Time 0930   PT Time Calculation (min) 41 min   Activity Tolerance Patient tolerated treatment well   Behavior During Therapy [facility] for tasks assessed/performed      Past Medical History:  Diagnosis Date  . Allergy   . Hernia, umbilical   . Hyperlipidemia   . Hypertension     Past Surgical History:  Procedure Laterality Date  . UVULOPALATOPHARYNGOPLASTY    . VARICOSE VEIN SURGERY      There were no vitals filed for this visit.      Subjective Assessment - 02/15/16 0852    Subjective Pt reports doing a lot more at home.  "I was up and down working on my lawnmower yesterday."   Pertinent History DM Type 2: HTN:  cerebral infarction due to thrombosis of basilar artery; acute kidney injury   Patient Stated Goals Increase strength in R leg and increase stamina; wants to return to work   Currently in Pain? No/denies  "Feels okay right now" XB:[id number]:shoulder      Performed heel cord stretch bil LE off edge of step x 30 sec x 2;Runners stretch at wall x 30 sec each direction.  Bil leg press x 15 reps x 100# then 10 reps 90# RLE only with cues for R knee control.  Prone hamstring curl with green theraband with decreased AROM so switched to yellow band.  Performed 15 reps total. Standing heel raises at counter x 20 with UE support.  Standing SLS x 10 sec x 3 each side. Seated LAQ with green theraband RLE x 10 reps, R ankle dorsiflexion x 10 with green theraband.  Stepping up  onto/off 6" step with RLE then stepping down/back up 6" step with RLE-x 15 reps each.  Uses 1 UE support.        PT Long Term Goals - 02/09/16 0944      PT LONG TERM GOAL #1   Title Pt will amb. 1000' on all surfaces modified independently with minimal to no c/o fatigue.  (03-09-16)   Time 4   Period Weeks   Status New     PT LONG TERM GOAL #2   Title Pt will subjectively report at least 50% improvement in endurance/activity tolerance.  (03-09-16)   Time 4   Period Weeks   Status New     PT LONG TERM GOAL #3   Title Negotiate steps without rail using step over step sequence to demo improved balance.  (03-09-16)   Time 4   Period Weeks   Status New     PT LONG TERM GOAL #4   Title Amb. 200' on flat,even surface with minimal R hip circumduction in swing phase of gait.  (03-09-16)   Time 4   Period Weeks   Status New     PT LONG TERM GOAL #5   Title Increase R SLS to >/= 5 secs to demo improved balance.  (03-09-16)   Time 4  Period Weeks   Status New     Additional Long Term Goals   Additional Long Term Goals Yes     PT LONG TERM GOAL #6   Title Independent in HEP for RLE strengthening.  (03-09-16)   Time 4   Period Weeks   Status New               Plan - 02/15/16 1614    Clinical Impression Statement Pt continues with RLE weakenss especially hamstrings and dorsiflexors.  Reports increasing activity at home and appears to have decreased insight into deficits until isolated exercises today.  Continue PT per POC.   Rehab Potential Good   PT Frequency 2x / week   PT Duration 4 weeks   PT Treatment/Interventions ADLs/Self Care Home Management;Therapeutic activities;Stair training;Gait training;Therapeutic exercise;Balance training;Neuromuscular re-education;Patient/family education;Orthotic Fit/Training   PT Next Visit Plan cont with RLE strengthening and endurance activities (try elliptical?)   PT Home Exercise Plan see pt instructions    Consulted and Agree with  Plan of Care Patient      Patient will benefit from skilled therapeutic intervention in order to improve the following deficits and impairments:  Abnormal gait, Decreased balance, Decreased activity tolerance, Decreased endurance, Decreased range of motion, Impaired flexibility, Decreased strength  Visit Diagnosis: Muscle weakness (generalized)  Other abnormalities of gait and mobility     Problem List Patient Active Problem List   Diagnosis Date Noted  . Diabetes mellitus type 2 in obese (HCC)   . Obesity   . Right foot drop   . Other vascular headache   . 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 Benign essential HTN   . Cerebral thrombosis with cerebral infarction 01/17/2016  . Type 2 diabetes mellitus with hyperglycemia, without long-term current use of insulin (HCC) 01/17/2016  . Stroke (HCC) 01/17/2016  . Cerebral infarction due to unspecified mechanism 01/15/2016  . Cerebrovascular accident (CVA) due to thrombosis of basilar artery (HCC)   . HLD (hyperlipidemia)   . Tobacco use disorder   . Stroke-like symptoms 01/14/2016  . Acute right-sided weakness 01/14/2016  . AKI (acute kidney injury) (HCC) 01/14/2016  . Accelerated hypertension 01/14/2016  . Right hemiparesis (HCC) 01/14/2016    Newell Coral 02/15/2016, 4:17 PM  [facility] [address] [place], [place], [address] Phone: [phone]   Fax:  [phone]  Name: Damarco Keysor. MRN: [id number] Date of Birth: 1966/06/04   Maxcine Ham [facility] 02/15/16 4:17 PM Phone: [phone] Fax: [phone]

## 2016-02-16 ENCOUNTER — Ambulatory Visit (INDEPENDENT_AMBULATORY_CARE_PROVIDER_SITE_OTHER): Payer: Managed Care, Other (non HMO) | Admitting: Nurse Practitioner

## 2016-02-16 ENCOUNTER — Encounter: Payer: Self-pay | Admitting: Nurse Practitioner

## 2016-02-16 VITALS — BP 142/78 | HR 88 | Ht 71.0 in | Wt 241.6 lb

## 2016-02-16 DIAGNOSIS — M6289 Other specified disorders of muscle: Secondary | ICD-10-CM

## 2016-02-16 DIAGNOSIS — I639 Cerebral infarction, unspecified: Secondary | ICD-10-CM | POA: Diagnosis not present

## 2016-02-16 DIAGNOSIS — E785 Hyperlipidemia, unspecified: Secondary | ICD-10-CM

## 2016-02-16 DIAGNOSIS — R2990 Unspecified symptoms and signs involving the nervous system: Secondary | ICD-10-CM | POA: Diagnosis not present

## 2016-02-16 DIAGNOSIS — F17200 Nicotine dependence, unspecified, uncomplicated: Secondary | ICD-10-CM

## 2016-02-16 DIAGNOSIS — I10 Essential (primary) hypertension: Secondary | ICD-10-CM

## 2016-02-16 DIAGNOSIS — G8191 Hemiplegia, unspecified affecting right dominant side: Secondary | ICD-10-CM | POA: Diagnosis not present

## 2016-02-16 DIAGNOSIS — R531 Weakness: Secondary | ICD-10-CM

## 2016-02-16 NOTE — Patient Instructions (Addendum)
Stressed the importance of management of risk factors to prevent further stroke Continue aspirinfor secondary stroke prevention Maintain strict control of hypertension with blood pressure goal below 130/90, today's reading 142/78 continue antihypertensive medications Control of diabetes with hemoglobin A1c below 7 followed by primary care most recent hemoglobin A1c8.5 continue diabetic medication Cholesterol with LDL cholesterol less than 70, followed by primary care,  most recent77 continue Lipitor Continue therapies  eat healthy diet with whole grains,  fresh fruits and vegetables Stop smoking completely May resume driving Follow-up in 3 months

## 2016-02-16 NOTE — Progress Notes (Addendum)
[facility]  PATIENT: [name] [name]. DOB: [birth year]   REASON FOR VISIT: Hospital follow-up for history of stroke right pontine infarction secondary to small vessel disease HISTORY FROM: Patient     HISTORY OF PRESENT ILLNESS:[name] Penais an 50 y.o.malewho was recently admitted to the hospital with a small left pontine infarct. He was discharged to home yesterday. His initial presentation was that of right-sided weakness. He was hospitalized from 01/14/2016 to08/11/2015. He underwent full evaluation including MRI of the brain as well as CT angiogram. 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 Echocardiogram had also been requested but there is no report in the chart at this time [name] [name] presents today 01/17/2016 reporting that around 9:00 last night 01/16/2016 he started to notice some heaviness in his right upper and lower extremities. By this morning he noted the symptoms had progressed and he was barely able to move his right upper or lower extremities he also reported slurring of speech. He notes a right facial droop as well. On direct exam [name] [name] is actually able to lift his right arm against gravity as well as his right lower extremity but he reports that feel very heavy he is noted to have slurring of speech that does not appear a phasic she also has a right facial droop. Patient was not administered IV t-PA secondary to recent stroke, not within the window. He was admitted  Right pontine infarct with evolution, initial infarct secondary to small vessel disease  .  Worsening of symptoms from stroke last week possibly related to dehydration and over-exertion. Worsening of R hemiparesis, dysarthria  MRI  L pontine infarct unchanged from MRI last week. No new infarct. small vessel disease. MRA  No significant flow limiting stenosis LDL 77. HgbA1c 8.5 he returns today for  follow-up. He is continuing to get occupational therapy and speech therapy. His weakness is much better he has a new diagnosis of diabetes. He claims he mowed the grass  yesterday at his home. He continues to smoke but has cut down from 3 packs a day to 1 pack a day. He is on aspirin for secondary stroke prevention without bruising. He is on Lipitor. Blood pressure is 142/78 in the office today. He returns for reevaluation  REVIEW OF SYSTEMS: Full 14 system review of systems performed and notable only for those listed, all others are neg:  Constitutional: Fatigue  Cardiovascular: neg Ear/Nose/Throat: neg  Skin: neg Eyes: neg Respiratory: neg Gastroitestinal: neg  Hematology/Lymphatic: neg  Endocrine: neg Musculoskeletal: Joint pain Allergy/Immunology: neg Neurological: neg Psychiatric: neg Sleep : neg   ALLERGIES: Allergies  Allergen Reactions  . Morphine And Related Itching    HOME MEDICATIONS: Outpatient Medications Prior to Visit  Medication Sig Dispense Refill  . aspirin 325 MG tablet Take 1 tablet (325 mg total) by mouth daily. 30 tablet 0  . atorvastatin (LIPITOR) 20 MG tablet Take 1 tablet (20 mg total) by mouth daily at 6 PM. 30 tablet 0  . blood glucose meter kit and supplies Dispense based on patient and insurance preference. Use up to four times daily as directed. (FOR ICD-9 250.00, 250.01). 180 each 0  . chlorthalidone (HYGROTON) 25 MG tablet Take 1 tablet (25 mg total) by mouth daily. 30 tablet 1  . FLUoxetine (PROZAC) 20 MG capsule Take 1 capsule (20 mg total) by mouth daily. 30 capsule 3  . lisinopril (PRINIVIL,ZESTRIL) 40  MG tablet Take 1 tablet (40 mg total) by mouth daily. 30 tablet 1  . metFORMIN (GLUCOPHAGE) 1000 MG tablet Take 1 tablet (1,000 mg total) by mouth 2 (two) times daily with a meal. 60 tablet 1   No facility-administered medications prior to visit.     PAST MEDICAL HISTORY: Past Medical History:  Diagnosis Date  . Allergy   . Hernia,  umbilical   . Hyperlipidemia   . Hypertension     PAST SURGICAL HISTORY: Past Surgical History:  Procedure Laterality Date  . UVULOPALATOPHARYNGOPLASTY    . VARICOSE VEIN SURGERY      FAMILY HISTORY: Family History  Problem Relation Age of Onset  . Heart attack Father     SOCIAL HISTORY: Social History   Social History  . Marital status: Married    Spouse name: N/A  . Number of children: N/A  . Years of education: N/A   Occupational History  . Not on file.   Social History Main Topics  . Smoking status: Current Every Day Smoker    Packs/day: 2.00  . Smokeless tobacco: Never Used  . Alcohol use Yes  . Drug use: No  . Sexual activity: Yes   Other Topics Concern  . Not on file   Social History Narrative  . No narrative on file     PHYSICAL EXAM  Vitals:   02/16/16 1058  BP: (!) 142/78  Pulse: 88  Weight: 241 lb 9.6 oz (109.6 kg)  Height: 5' 11" (1.803 m)   Body mass index is 33.7 kg/m.  Generalized: Well developed, Obese male in no acute distress  Head: normocephalic and atraumatic,. Oropharynx benign  Neck: Supple, no carotid bruits  Cardiac: Regular rate rhythm, no murmur  Musculoskeletal: No deformity   Neurological examination   Mentation: Alert oriented to time, place, history taking. Attention span and concentration appropriate. Recent and remote memory intact.  Follows all commands speech and language fluent. No dysarthria   Cranial nerve II-XII: Fundoscopic exam not done.Pupils were equal round reactive to light extraocular movements were full, visual field were full on confrontational test. Facial sensation and strength were normal. hearing was intact to finger rubbing bilaterally. Uvula tongue midline. head turning and shoulder shrug were normal and symmetric.Tongue protrusion into cheek strength was normal. Motor: normal bulk and tone, full strength in the BUE, BLE, on the left. Mild weakness in the hamstrings and dorsiflexors on the right.  Mild weakness of the intrinsic hand muscles on the right  Sensory: normal and symmetric to light touch, pinprick, and  Vibin the upper and lower extremities Coordination: finger-nose-finger, heel-to-shin bilaterally, no dysmetria Reflexes: Brachioradialis 2/2, biceps 2/2, triceps 2/2, patellar 2/2, Achilles 2/2, plantar responses were flexor bilaterally. Gait and Station: Rising up from seated position without assistance, normal stance,  moderate stride, good arm swing, smooth turning, able to perform tiptoe, and heel walking without difficulty. Tandem gait is steady  DIAGNOSTIC DATA (LABS, IMAGING, TESTING) - I reviewed patient records, labs, notes, testing and imaging myself where available.  Lab Results  Component Value Date   WBC 10.3 01/31/2016   HGB 14.4 01/31/2016   HCT 44.2 01/31/2016   MCV 95.5 01/31/2016   PLT 278 01/31/2016      Component Value Date/Time   NA 138 01/31/2016 1227   K 3.8 01/31/2016 1227   CL 101 01/31/2016 1227   CO2 27 01/31/2016 1227   GLUCOSE 106 (H) 01/31/2016 1227   BUN 27 (H) 01/31/2016 1227   CREATININE 0.99  01/31/2016 1227   CREATININE 0.67 04/17/2014 1202   CALCIUM 9.8 01/31/2016 1227   PROT 6.5 01/21/2016 0530   ALBUMIN 3.5 01/21/2016 0530   AST 47 (H) 01/21/2016 0530   ALT 53 01/21/2016 0530   ALKPHOS 63 01/21/2016 0530   BILITOT 1.1 01/21/2016 0530   GFRNONAA >60 01/31/2016 1227   GFRAA >60 01/31/2016 1227   Lab Results  Component Value Date   CHOL 139 01/15/2016   HDL 37 (L) 01/15/2016   LDLCALC 77 01/15/2016   TRIG 127 01/15/2016   CHOLHDL 3.8 01/15/2016   Lab Results  Component Value Date   HGBA1C 8.5 (H) 01/15/2016    ASSESSMENT AND PLAN  50 y.o. year old male  has a past medical history of Allergy; Hernia, umbilical; Hyperlipidemia; and Hypertension. here to follow up for hospital admission for Right pontine infarct with evolution, initial infarct secondary to small vessel disease  .  Worsening of symptoms from stroke  last week possibly related to dehydration and over-exertion. Worsening of R hemiparesis, dysarthria  MRI  L pontine infarct unchanged from MRI last week. No new infarct. small vessel disease. MRA  No significant flow limiting stenosis LDL 77 HgbA1c 8.5 The patient is a current patient of Dr. Leonie Man  who is out of the office today . This note is sent to the work in doctor.     PLAN: Stressed the importance of management of risk factors to prevent further stroke Continue aspirin for secondary stroke prevention Maintain strict control of hypertension with blood pressure goal below 130/90, today's reading 142/78 continue antihypertensive medications Control of diabetes with hemoglobin A1c below 7 followed by primary care most recent hemoglobin A1c8.5 continue diabetic medication Cholesterol with LDL cholesterol less than 70, followed by primary care,  most recent   77 continue Lipitor Continue therapies  eat healthy diet with whole grains,  fresh fruits and vegetables Stop smoking completely May resume driving Follow-up in 3 months Discussed risk for recurrent stroke/ TIA and answered additional questions This was a prolonged visit requiring 30 minutes and medical decision making of high complexity with extensive review of history, hospital chart, counseling and answering questions Dennie Bible, [facility], [facility], APRN  [facility] [address] [place], [place] [address] [phone]  I reviewed the above note and documentation by the Nurse Practitioner and agree with the history, physical exam, assessment and plan as outlined above. I was immediately available for face-to-face consultation. Star Age, MD, PhD [facility])

## 2016-02-17 ENCOUNTER — Ambulatory Visit: Payer: Managed Care, Other (non HMO) | Admitting: Occupational Therapy

## 2016-02-17 ENCOUNTER — Ambulatory Visit: Payer: Managed Care, Other (non HMO) | Admitting: Physical Therapy

## 2016-02-17 ENCOUNTER — Encounter: Payer: Self-pay | Admitting: Occupational Therapy

## 2016-02-17 VITALS — BP 131/76 | HR 88

## 2016-02-17 DIAGNOSIS — M6281 Muscle weakness (generalized): Secondary | ICD-10-CM

## 2016-02-17 DIAGNOSIS — I69351 Hemiplegia and hemiparesis following cerebral infarction affecting right dominant side: Secondary | ICD-10-CM

## 2016-02-17 DIAGNOSIS — R278 Other lack of coordination: Secondary | ICD-10-CM

## 2016-02-17 DIAGNOSIS — R2689 Other abnormalities of gait and mobility: Secondary | ICD-10-CM

## 2016-02-17 NOTE — Patient Instructions (Addendum)
Lateral Pinch Strengthening (Resistive Putty)    Squeeze between thumb and side of each finger in turn. Repeat 10-15__ times. Do _2_ sessions per day.  Copyright  VHI. All rights reserved.   Three Jaw Chuck Pinch Strengthening (Resistive Putty)    Pull putty, using thumb, index and middle fingers. Repeat 10-15__ times. Do _2__ sessions per day.   Add Chair or wall push ups to home exercises for shoulder strengthening. You could also try placing and reaching for items on shelves, stay aware of your shoulder position while you're doing things.  Place coins and small objects in putty and remove using tip to tip prehension.

## 2016-02-17 NOTE — Therapy (Signed)
[facility] [address] [place], [place], [address] Phone: [phone]   Fax:  [phone]  Physical Therapy Treatment  Patient Details  Name: [name] [name]. MRN: [id number] Date of Birth: [birth year] Referring Provider: Dr. Donnel Saxon  Encounter Date: 02/17/2016      PT End of Session - 02/17/16 1251    Visit Number 3   Number of Visits 9   Date for PT Re-Evaluation 03/09/16   Authorization Type Aetna   PT Start Time [phone]   PT Stop Time 0930   PT Time Calculation (min) 43 min   Equipment Utilized During Treatment Gait belt   Activity Tolerance Patient tolerated treatment well   Behavior During Therapy [facility] for tasks assessed/performed      Past Medical History:  Diagnosis Date  . Allergy   . Hernia, umbilical   . Hyperlipidemia   . Hypertension     Past Surgical History:  Procedure Laterality Date  . UVULOPALATOPHARYNGOPLASTY    . VARICOSE VEIN SURGERY      Vitals:   02/17/16 0900  BP: 131/76  Pulse: 88        Subjective Assessment - 02/17/16 0909    Subjective "The doctor said I could drive."  Denies falls or changes.   Patient is accompained by: Family member   Pertinent History DM Type 2: HTN:  cerebral infarction due to thrombosis of basilar artery; acute kidney injury   Patient Stated Goals Increase strength in R leg and increase stamina; wants to return to work   Currently in Pain? No/denies              [facility] Adult PT Treatment/Exercise - 02/17/16 0911      Exercises   Exercises Knee/Hip     Knee/Hip Exercises: Aerobic   Elliptical 90 seconds forward and 90 seconds backward level 1 with UE assist     Knee/Hip Exercises: Machines for Strengthening   Cybex Knee Extension Bil LE x 110# x 15;RLE 90# x 15 x 2;LLE 90# 15 x 1     Knee/Hip Exercises: Standing   Forward Step Up Both;1 set;15 reps;Hand Hold: 1;Step Height: 6"   Other Standing Knee Exercises Step taps to 6",  12", 18", 12", 6" then floor bil LE x 15 with 1 UE hand hold     Knee/Hip Exercises: Seated   Hamstring Curl Right;1 set;15 reps;Other (comment)  green theraband     Knee/Hip Exercises: Supine   Bridges Both;1 set;10 reps   Bridges with Clamshell Both;1 set;10 reps   Single Leg Bridge Right;1 set;15 reps     Knee/Hip Exercises: Prone   Hamstring Curl 2 sets;15 reps  initially no weight then with 2# weight-cues for control           PT Long Term Goals - 02/09/16 0944      PT LONG TERM GOAL #1   Title Pt will amb. 1000' on all surfaces modified independently with minimal to no c/o fatigue.  (03-09-16)   Time 4   Period Weeks   Status New     PT LONG TERM GOAL #2   Title Pt will subjectively report at least 50% improvement in endurance/activity tolerance.  (03-09-16)   Time 4   Period Weeks   Status New     PT LONG TERM GOAL #3   Title Negotiate steps without rail using step over step sequence to demo improved balance.  (03-09-16)   Time 4   Period Weeks  Status New     PT LONG TERM GOAL #4   Title Amb. 200' on flat,even surface with minimal R hip circumduction in swing phase of gait.  (03-09-16)   Time 4   Period Weeks   Status New     PT LONG TERM GOAL #5   Title Increase R SLS to >/= 5 secs to demo improved balance.  (03-09-16)   Time 4   Period Weeks   Status New     Additional Long Term Goals   Additional Long Term Goals Yes     PT LONG TERM GOAL #6   Title Independent in HEP for RLE strengthening.  (03-09-16)   Time 4   Period Weeks   Status New               Plan - 02/17/16 1251    Clinical Impression Statement Pt continues with decreased insight into R LE deficits.  Continues with RLE weakenss.  Continue PT per POC.   Rehab Potential Good   PT Frequency 2x / week   PT Duration 4 weeks   PT Treatment/Interventions ADLs/Self Care Home Management;Therapeutic activities;Stair training;Gait training;Therapeutic exercise;Balance  training;Neuromuscular re-education;Patient/family education;Orthotic Fit/Training   PT Next Visit Plan cont with RLE strengthening and endurance activities.  Elliptical, braiding, jumping?   PT Home Exercise Plan see pt instructions    Consulted and Agree with Plan of Care Patient;Family member/caregiver   Family Member Consulted wife      Patient will benefit from skilled therapeutic intervention in order to improve the following deficits and impairments:  Abnormal gait, Decreased balance, Decreased activity tolerance, Decreased endurance, Decreased range of motion, Impaired flexibility, Decreased strength  Visit Diagnosis: Muscle weakness (generalized)  Other lack of coordination  Hemiplegia and hemiparesis following cerebral infarction affecting right dominant side (HCC)  Other abnormalities of gait and mobility     Problem List Patient Active Problem List   Diagnosis Date Noted  . Diabetes mellitus type 2 in obese (HCC)   . Obesity   . Right foot drop   . Other vascular headache   . 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 Benign essential HTN   . Cerebral thrombosis with cerebral infarction 01/17/2016  . Type 2 diabetes mellitus with hyperglycemia, without long-term current use of insulin (HCC) 01/17/2016  . Stroke (HCC) 01/17/2016  . Cerebral infarction due to unspecified mechanism 01/15/2016  . Cerebrovascular accident (CVA) due to thrombosis of basilar artery (HCC)   . HLD (hyperlipidemia)   . Tobacco use disorder   . Stroke-like symptoms 01/14/2016  . Acute right-sided weakness 01/14/2016  . AKI (acute kidney injury) (HCC) 01/14/2016  . Accelerated hypertension 01/14/2016  . Right hemiparesis (HCC) 01/14/2016    Newell CoralRobertson, Denise Terry 02/17/2016, 12:53 PM  [facility] [address] [place], [place], [address] Phone: [phone]   Fax:  [phone]  Name: [name] RalphHerman Carnegie Jr. MRN: [id number] Date of  Birth: 10/10/1965   Maxcine HamDenise Terry Robertson, PTA [facility] 02/17/16 12:53 PM Phone: [phone] Fax: [phone]

## 2016-02-17 NOTE — Therapy (Signed)
[facility] [address] [place], [place], [address] Phone: [phone]   Fax:  [phone]  Occupational Therapy Treatment  Patient Details  Name: [name] [name]. MRN: [id number] Date of Birth: 10/04/1965 Referring Provider: Dr Ammie Dalton  Encounter Date: 02/17/2016      OT End of Session - 02/17/16 0953    Visit Number 3   Number of Visits 12   Date for OT Re-Evaluation 04/03/16   Authorization Type AET[employer] Managed   OT Start Time 0801   OT Stop Time 0844   OT Time Calculation (min) 43 min   Equipment Utilized During Treatment UBE, putty, small pegs/pegboard, graded clothespins   Activity Tolerance Patient tolerated treatment well   Behavior During Therapy WFL for tasks assessed/performed      Past Medical History:  Diagnosis Date  . Allergy   . Hernia, umbilical   . Hyperlipidemia   . Hypertension     Past Surgical History:  Procedure Laterality Date  . UVULOPALATOPHARYNGOPLASTY    . VARICOSE VEIN SURGERY      There were no vitals filed for this visit.      Subjective Assessment - 02/17/16 0958    Subjective  Pt reports that his shoulder is sore, rates it as 3-4/10 "When I'm using it" If at rest "It doesn't bother me" "I am driving, the doctor gave me a note" Pt also reports tha the continues to smoke and states that he does not plan to quit "I'm better after having my stroke"  Pt states/reports this since he lost 30#                      OT Treatments/Exercises (OP) - 02/17/16 0001      ADLs   ADL Comments Discussed functional activities and positioning of R UE/shoulder as pt prefers functional activity as opposed to some fine motor and putty exercises. Pt educated to work on reaching, using RUE to hammer at home, chair push ups etc... Discussed ADL's as well and pinches during eating, grooming etc.     Exercises   Exercises Hand     Hand Exercises   Other Hand Exercises Review  and performance of putty HEP, UBE x10 min level 5 rotating forward and backwards for shoulder strengthening, endurance.      Fine Motor Coordination   Other Fine Motor Exercises Reivewed & performed coordination HEP, putty and small pegboard, typing, graded clothespins. Discussed placing coins and other small objects in putty for coordination. Added 3 point and lateral pinch with green putty.                 OT Education - 02/17/16 [phone]    Education provided Yes   Education Details See pt instructions, for putty, wall and chair push ups etc.   Person(s) Educated Patient;Spouse   Methods Explanation;Demonstration;Handout   Comprehension Verbalized understanding;Returned demonstration          OT Short Term Goals - 02/07/16 1153      OT SHORT TERM GOAL #1   Title Pt will be I signs and symptoms CVA/Stroke awareness   Time 3   Period Weeks   Status New     OT SHORT TERM GOAL #2   Title Pt will be Mod I HEP RUE for coordinationa and strengthening   Time 3   Period Weeks   Status New           OT Long Term Goals -  02/07/16 1154      OT LONG TERM GOAL #1   Title Pt will be Mod I upgraded HEP for coordination and strength RUE   Time 6   Period Weeks   Status New     OT LONG TERM GOAL #2   Title Pt will demonstrate improved coordinaiton RUE as seen by improved 9 hole peg score by 15 seconds or more   Baseline 56.85 seconds   Time 6   Period Weeks   Status New     OT LONG TERM GOAL #3   Title Pt will demonstrate improved grip strength RUE as seen by JAMAR grip of 50# or greater R hand   Baseline 37# (RUE position 2)   Time 6   Period Weeks   Status New     OT LONG TERM GOAL #4   Title Pt will demonstrate gross RUE/shoulder strength of 4/5 or greater as seen by MMT   Baseline -3/5 RUE   Time 6   Period Weeks   Status New     OT LONG TERM GOAL #5   Title Pt will demonstrate improved coordination as seen by pt reports of typing, texting and signing his  name WFL's   Time 6   Period Weeks   Status New               Plan - 02/17/16 1001    Plan Issue shoulder HEP (consider green therabnad), multi-task scanning in busy environment.      Patient will benefit from skilled therapeutic intervention in order to improve the following deficits and impairments:  Decreased balance, Decreased endurance, Decreased mobility, Increased edema, Decreased range of motion, Decreased knowledge of precautions, Decreased activity tolerance, Decreased coordination, Decreased knowledge of use of DME, Impaired flexibility, Impaired UE functional use  Visit Diagnosis: Muscle weakness (generalized)  Other lack of coordination  Hemiplegia and hemiparesis following cerebral infarction affecting right dominant side [facility](HCC)    Problem List Patient Active Problem List   Diagnosis Date Noted  . Diabetes mellitus type 2 in obese (HCC)   . Obesity   . Right foot drop   . Other vascular headache   . 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 Benign essential HTN   . Cerebral thrombosis with cerebral infarction 01/17/2016  . Type 2 diabetes mellitus with hyperglycemia, without long-term current use of insulin (HCC) 01/17/2016  . Stroke (HCC) 01/17/2016  . Cerebral infarction due to unspecified mechanism 01/15/2016  . Cerebrovascular accident (CVA) due to thrombosis of basilar artery (HCC)   . HLD (hyperlipidemia)   . Tobacco use disorder   . Stroke-like symptoms 01/14/2016  . Acute right-sided weakness 01/14/2016  . AKI (acute kidney injury) (HCC) 01/14/2016  . Accelerated hypertension 01/14/2016  . Right hemiparesis (HCC) 01/14/2016    Barnhill, Amy Dionicio StallBeth Dixon, OTR/L 02/17/2016, 10:01 AM  [facility] [address] [place], [place], [address] Phone: [phone]   Fax:  [phone]  Name: [name] RalphHerman Rarick Jr. MRN: [id number] Date of Birth: 1966-04-11

## 2016-02-21 ENCOUNTER — Telehealth: Payer: Self-pay | Admitting: Nurse Practitioner

## 2016-02-21 NOTE — Telephone Encounter (Signed)
Spoke to pt.   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  It could be to whom it may concern or to attention of Arlina Robesaryl Taylor.  He stated he was fine, out in his garden as we were speaking.  We would call him when this was ready.

## 2016-02-21 NOTE — Telephone Encounter (Signed)
Pt called requesting letter to return to work starting next week 9/20 Wednesday. May call pt at [phone]

## 2016-02-21 NOTE — Telephone Encounter (Signed)
Called pt and let him know that signed copy ready for pick up.  He will pick up tomorrow after therapy.

## 2016-02-22 ENCOUNTER — Ambulatory Visit: Payer: Managed Care, Other (non HMO) | Admitting: *Deleted

## 2016-02-22 ENCOUNTER — Ambulatory Visit: Payer: Managed Care, Other (non HMO) | Admitting: Physical Therapy

## 2016-02-22 ENCOUNTER — Encounter: Payer: Self-pay | Admitting: *Deleted

## 2016-02-22 DIAGNOSIS — R2689 Other abnormalities of gait and mobility: Secondary | ICD-10-CM

## 2016-02-22 DIAGNOSIS — R278 Other lack of coordination: Secondary | ICD-10-CM

## 2016-02-22 DIAGNOSIS — M6281 Muscle weakness (generalized): Secondary | ICD-10-CM

## 2016-02-22 DIAGNOSIS — I69351 Hemiplegia and hemiparesis following cerebral infarction affecting right dominant side: Secondary | ICD-10-CM

## 2016-02-22 NOTE — Patient Instructions (Addendum)
Triceps Activities: Push-Ups    Sit in chair & pushes down on armrests to lift bottom up off seat. Lean on wall with straightened arms. Bend arms and touch nose to wall. Do not move feet.Stomach should not touch wall.Push away by straightening arms. Keep trunk straight. Do not let fingers point inward. Do regular push ups if able.  All Fours Push-Up With Press-Up    On all fours with hands shoulder-width apart, bend elbows and perform push-up. Return and press shoulders up, arching upper back. Return. Repeat ___10_ times. Do __2__ sessions per day.  [email or web address].[email or web address]   Bear weight through your shoulders   Discussed Shoulder stabilization exercises (prone on all 4's) and demonstrated in clinic for pt to perform at home. He verbalized understanding as described.

## 2016-02-22 NOTE — Therapy (Signed)
[facility] [address] [place], [place], [address] Phone: [phone]   Fax:  [phone]  Occupational Therapy Treatment  Patient Details  Name: [name] RalphHerman Waldvogel Jr. MRN: [id number] Date of Birth: 12/04/1965 Referring Provider: Dr Ammie DaltonA Patel  Encounter Date: 02/22/2016      OT End of Session - 02/22/16 1310    Visit Number 4   Number of Visits 12   Date for OT Re-Evaluation 04/03/16   Authorization Type AET[employer] Managed   OT Start Time 0759   OT Stop Time 0843   OT Time Calculation (min) 44 min   Equipment Utilized During Treatment UBE, small pegs/pegboard. JAMAR for grip assessment.   Activity Tolerance Patient tolerated treatment well   Behavior During Therapy [facility] for tasks assessed/performed      Past Medical History:  Diagnosis Date  . Allergy   . Hernia, umbilical   . Hyperlipidemia   . Hypertension     Past Surgical History:  Procedure Laterality Date  . UVULOPALATOPHARYNGOPLASTY    . VARICOSE VEIN SURGERY      There were no vitals filed for this visit.      Subjective Assessment - 02/22/16 0801    Subjective  Pt denies shoulder pain today, c/o back pain. "I did a lot of yard work last weekend"    Pertinent History See EPIC history.    Patient Stated Goals Pt states that current goals are improve fine motor coordination, strength RUE   Currently in Pain? Yes   Pain Score 3    Pain Location Back   Pain Orientation Lower   Pain Descriptors / Indicators Sore   Pain Type Acute pain   Pain Onset In the past 7 days            [facility] - 02/22/16 0001      Hand Function   Right Hand Grip (lbs) 61   Left Hand Grip (lbs) 87                  OT Treatments/Exercises (OP) - 02/22/16 0001      Exercises   Exercises Shoulder;Hand     Shoulder Exercises: Prone   Other Prone Exercises Pt educated in prone shoulder stabilization exercises; Wall push ups and regular push ups  as well as WB ex's through shoulders/RUE. Added to home program.     Shoulder Exercises: ROM/Strengthening   UBE (Upper Arm Bike) 8 min level 7 resistance rotating forward and back for strengthening, conditioning and endurance RUE      Fine Motor Coordination   Fine Motor Coordination In hand manipuation training;Small Pegboard;Picking up coins;Manipulating coins;Stacking coins   Small Pegboard Standing with pegboard vertical, head turns to follow pattern using right hand for coordination   Picking up coins R hand   Manipulating coins R hand   Stacking coins R hand w/o difficulty noted   Other Fine Motor Exercises Picking up small pegs 4-5 at a time and releasing into container w/ min difficulty.                  OT Short Term Goals - 02/22/16 1316      OT SHORT TERM GOAL #2   Title Pt will be Mod I HEP RUE for coordination and strengthening   Time 3   Period Weeks   Status Achieved           OT Long Term Goals - 02/07/16 1154  OT LONG TERM GOAL #1   Title Pt will be Mod I upgraded HEP for coordination and strength RUE   Time 6   Period Weeks   Status New     OT LONG TERM GOAL #2   Title Pt will demonstrate improved coordinaiton RUE as seen by improved 9 hole peg score by 15 seconds or more   Baseline 56.85 seconds   Time 6   Period Weeks   Status New     OT LONG TERM GOAL #3   Title Pt will demonstrate improved grip strength RUE as seen by JAMAR grip of 50# or greater R hand   Baseline 37# (RUE position 2)   Time 6   Period Weeks   Status New     OT LONG TERM GOAL #4   Title Pt will demonstrate gross RUE/shoulder strength of 4/5 or greater as seen by MMT   Baseline -3/5 RUE   Time 6   Period Weeks   Status New     OT LONG TERM GOAL #5   Title Pt will demonstrate improved coordination as seen by pt reports of typing, texting and signing his name WFL's   Time 6   Period Weeks   Status New               Plan - 02/22/16 1311     Clinical Impression Statement Pt reports that he will be returning to work next week, "Wednesday, 9/20 and my last appointment will be Tuesday". He reportst hat he will be traveling to [place] for work through December. Pt cont to demonstrate improved RUE functional use and coordination. Grip assessment today showing nice gains in strength R hand.   Rehab Potential Good   OT Frequency 2x / week   OT Duration 6 weeks   OT Treatment/Interventions Self-care/ADL training;Therapeutic exercise;Neuromuscular education;Fluidtherapy;Energy conservation;Building services engineer;Therapeutic exercises;Patient/family education;Balance training;Therapeutic activities;Manual Therapy;DME and/or AE instruction   Plan Review shoulder stabilization ex's and consider upgrade to green theraband next visit. Multi-task scanning in busy environment. Begin checking goals  as last appointment anticipated is 02/29/16 per pt report & returning to work.   Consulted and Agree with Plan of Care Patient      Patient will benefit from skilled therapeutic intervention in order to improve the following deficits and impairments:  Decreased balance, Decreased endurance, Decreased mobility, Increased edema, Decreased range of motion, Decreased knowledge of precautions, Decreased activity tolerance, Decreased coordination, Decreased knowledge of use of DME, Impaired flexibility, Impaired UE functional use  Visit Diagnosis: Muscle weakness (generalized)  Other lack of coordination  Hemiplegia and hemiparesis following cerebral infarction affecting right dominant side [facility])    Problem List Patient Active Problem List   Diagnosis Date Noted  . Diabetes mellitus type 2 in obese (HCC)   . Obesity   . Right foot drop   . Other vascular headache   . 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 Benign essential HTN   . Cerebral thrombosis with cerebral infarction 01/17/2016  . Type 2 diabetes mellitus with hyperglycemia, without  long-term current use of insulin (HCC) 01/17/2016  . Stroke (HCC) 01/17/2016  . Cerebral infarction due to unspecified mechanism 01/15/2016  . Cerebrovascular accident (CVA) due to thrombosis of basilar artery (HCC)   . HLD (hyperlipidemia)   . Tobacco use disorder   . Stroke-like symptoms 01/14/2016  . Acute right-sided weakness 01/14/2016  . AKI (acute kidney injury) (HCC) 01/14/2016  . Accelerated hypertension 01/14/2016  . Right hemiparesis (HCC) 01/14/2016  [address], Whitley Patchen Beth Dixon, OTR/L 02/22/2016, 1:17 PM  [facility] [address] [place], [place], [address] Phone: [phone]   Fax:  [phone]  Name: [name] [name]. MRN: [id number] Date of Birth: 1966-05-16

## 2016-02-23 NOTE — Therapy (Signed)
[facility] [address] [place], [place], [address] Phone: [phone]   Fax:  [phone]  Physical Therapy Treatment  Patient Details  Name: [name] RalphHerman Wempe Jr. MRN: [id number] Date of Birth: 10/31/1965 Referring Provider: Dr. Donnel SaxonAshok Patel  Encounter Date: 02/22/2016      PT End of Session - 02/23/16 1852    Visit Number 4   Number of Visits 9   Date for PT Re-Evaluation 03/09/16   Authorization Type [employer]et[employer]   PT Start Time 0932   PT Stop Time 1015   PT Time Calculation (min) 43 min      Past Medical History:  Diagnosis Date  . Allergy   . Hernia, umbilical   . Hyperlipidemia   . Hypertension     Past Surgical History:  Procedure Laterality Date  . UVULOPALATOPHARYNGOPLASTY    . VARICOSE VEIN SURGERY      There were no vitals filed for this visit.      Subjective Assessment - 02/23/16 1843    Subjective Pt reports he is getting stronger - is leaving Sunday, 9-24 for [place] work   Pertinent History DM Type 2: HTN:  cerebral infarction due to thrombosis of basilar artery; acute kidney injury   Patient Stated Goals Increase strength in R leg and increase stamina; wants to return to work   Currently in Pain? Yes   Pain Score 3    Pain Location Back   Pain Orientation Lower   Pain Descriptors / Indicators Sore   Pain Type Acute pain   Pain Onset In the past 7 days   Pain Frequency Intermittent                         [facility] Adult PT Treatment/Exercise - 02/23/16 0001      Knee/Hip Exercises: Aerobic   Elliptical 2" forward:  45 secs backward     Knee/Hip Exercises: Machines for Strengthening   Cybex Knee Extension bil. LE's 110# x 20 reps: RLE only 70# 1 set 15 reps     Knee/Hip Exercises: Standing   Heel Raises Right;1 set;10 reps   Heel Raises Limitations R plantarflexion weakness   Forward Step Up Right;1 set;10 reps;Hand Hold: 1;Step Height: 6"   Step Down Right;1  set;Hand Hold: 1;Step Height: 6"     Knee/Hip Exercises: Seated   Hamstring Curl Strengthening;Right;1 set  blue theraband in seated position     Knee/Hip Exercises: Prone   Hamstring Curl 10 reps;2 sets  5# weight      NeuroRe-ed; Amb. forward, backward and sideways on tiptoes for plantarflexor strengthening - 10' x 4 reps each direction Standing on Bosu with bil. LE's - partial squats x 10 reps with minimal UE support RLE in center for improved SLS - moving LLE up/back and laterally x 10 reps each direction  Stepping up/back on incline and on decline with R knee flexed for increasing quad strength and SLS                PT Long Term Goals - 02/09/16 0944      PT LONG TERM GOAL #1   Title Pt will amb. 1000' on all surfaces modified independently with minimal to no c/o fatigue.  (03-09-16)   Time 4   Period Weeks   Status New     PT LONG TERM GOAL #2   Title Pt will subjectively report at least 50% improvement in endurance/activity tolerance.  (03-09-16)  Time 4   Period Weeks   Status New     PT LONG TERM GOAL #3   Title Negotiate steps without rail using step over step sequence to demo improved balance.  (03-09-16)   Time 4   Period Weeks   Status New     PT LONG TERM GOAL #4   Title Amb. 200' on flat,even surface with minimal R hip circumduction in swing phase of gait.  (03-09-16)   Time 4   Period Weeks   Status New     PT LONG TERM GOAL #5   Title Increase R SLS to >/= 5 secs to demo improved balance.  (03-09-16)   Time 4   Period Weeks   Status New     Additional Long Term Goals   Additional Long Term Goals Yes     PT LONG TERM GOAL #6   Title Independent in HEP for RLE strengthening.  (03-09-16)   Time 4   Period Weeks   Status New               Plan - 02/23/16 1852    Clinical Impression Statement Pt progressing well towards LTG's - RLE is increasing in strength with gait deviations decreasing   Rehab Potential Good   PT Frequency  2x / week   PT Duration 4 weeks   PT Treatment/Interventions ADLs/Self Care Home Management;Therapeutic activities;Stair training;Gait training;Therapeutic exercise;Balance training;Neuromuscular re-education;Patient/family education;Orthotic Fit/Training   PT Next Visit Plan cont with RLE strengthening and endurance activities   PT Home Exercise Plan see pt instructions    Consulted and Agree with Plan of Care Patient      Patient will benefit from skilled therapeutic intervention in order to improve the following deficits and impairments:  Abnormal gait, Decreased balance, Decreased activity tolerance, Decreased endurance, Decreased range of motion, Impaired flexibility, Decreased strength  Visit Diagnosis: Muscle weakness (generalized)  Other abnormalities of gait and mobility     Problem List Patient Active Problem List   Diagnosis Date Noted  . Diabetes mellitus type 2 in obese (HCC)   . Obesity   . Right foot drop   . Other vascular headache   . 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 Benign essential HTN   . Cerebral thrombosis with cerebral infarction 01/17/2016  . Type 2 diabetes mellitus with hyperglycemia, without long-term current use of insulin (HCC) 01/17/2016  . Stroke (HCC) 01/17/2016  . Cerebral infarction due to unspecified mechanism 01/15/2016  . Cerebrovascular accident (CVA) due to thrombosis of basilar artery (HCC)   . HLD (hyperlipidemia)   . Tobacco use disorder   . Stroke-like symptoms 01/14/2016  . Acute right-sided weakness 01/14/2016  . AKI (acute kidney injury) (HCC) 01/14/2016  . Accelerated hypertension 01/14/2016  . Right hemiparesis (HCC) 01/14/2016    [name] [name], [name] [name] 02/23/2016, 6:55 PM  [facility] [address] [place], [place], [address] Phone: [phone]   Fax:  [phone]  Name: [name] Jeffreys. MRN: [id number] Date of Birth: 08/12/1965

## 2016-02-24 ENCOUNTER — Ambulatory Visit: Payer: Managed Care, Other (non HMO) | Admitting: *Deleted

## 2016-02-24 ENCOUNTER — Encounter: Payer: Self-pay | Admitting: *Deleted

## 2016-02-24 ENCOUNTER — Telehealth: Payer: Self-pay | Admitting: Occupational Therapy

## 2016-02-24 ENCOUNTER — Ambulatory Visit: Payer: Managed Care, Other (non HMO) | Admitting: Physical Therapy

## 2016-02-24 DIAGNOSIS — R278 Other lack of coordination: Secondary | ICD-10-CM

## 2016-02-24 DIAGNOSIS — I69351 Hemiplegia and hemiparesis following cerebral infarction affecting right dominant side: Secondary | ICD-10-CM

## 2016-02-24 DIAGNOSIS — R2689 Other abnormalities of gait and mobility: Secondary | ICD-10-CM

## 2016-02-24 DIAGNOSIS — M6281 Muscle weakness (generalized): Secondary | ICD-10-CM | POA: Diagnosis not present

## 2016-02-24 NOTE — Therapy (Signed)
[facility] [address] [place], [place], [address] Phone: [phone]   Fax:  [phone]  Physical Therapy Treatment  Patient Details  Name: [name] RalphHerman Delellis Jr. MRN: [id number] Date of Birth: November 12, 1965 Referring Provider: Dr. Donnel SaxonAshok Patel  Encounter Date: 02/24/2016      PT End of Session - 02/24/16 1649    Visit Number 5   Number of Visits 9   Date for PT Re-Evaluation 03/09/16   Authorization Type Aetna   PT Start Time [phone]   PT Stop Time 1015   PT Time Calculation (min) 42 min      Past Medical History:  Diagnosis Date  . Allergy   . Hernia, umbilical   . Hyperlipidemia   . Hypertension     Past Surgical History:  Procedure Laterality Date  . UVULOPALATOPHARYNGOPLASTY    . VARICOSE VEIN SURGERY      There were no vitals filed for this visit.      Subjective Assessment - 02/24/16 1644    Subjective Pt states he had some muscle soreness in RLE after last treatment session   Patient is accompained by: Family member   Pertinent History DM Type 2: HTN:  cerebral infarction due to thrombosis of basilar artery; acute kidney injury   Patient Stated Goals Increase strength in R leg and increase stamina; wants to return to work   Currently in Pain? No/denies                         [facility] Adult [facility] Treatment/Exercise - 02/24/16 1307      Knee/Hip Exercises: Stretches   Gastroc Stretch Right;1 rep;30 seconds     Knee/Hip Exercises: Aerobic   Elliptical 1" forward, 1" backward level 1.5     Knee/Hip Exercises: Machines for Strengthening   Cybex Leg Press bil. LE's 110# 10 reps;  LLE only 6 reps 110#; 10 reps 90#, 10 reps 80#     Knee/Hip Exercises: Standing   Heel Raises Right;1 set;10 reps   Forward Step Up Right;1 set;10 reps   Step Down Right;1 set;Hand Hold: 1;Step Height: 6"     TherEx:  R heel raises off Hi/low mat table for closed chain strengthening - 2 sets 10  reps  Seated R dorsiflexion with eversion - with green theraband - x 10 reps; in standing position - R dorsiflexion with 3 sec hold 10 reps  NeuroRe-ed:  Amb. On tip toes for plantarflexor strengthening and for improved balance - along countertop  - without UE support - 10' x 2 reps up and back and then backwards 10' x 2 reps for R hamstring strengthening  Standing on BOSU - moving RLE up/back and laterally x 10 reps each direction for improved L SLS and LLE strengthening - With UE support prn Standing - rolling ball up/back and laterally 10 reps each direction with RLE for improved LLE SLS; with LLE for improved coordination And motor control          PT Education - 02/24/16 1647    Education provided Yes   Education Details Emphasized importance of R dorsiflexors and plantarflexor strengthening for HEP   Person(s) Educated Patient   Methods Explanation;Demonstration   Comprehension Verbalized understanding;Returned demonstration             PT Long Term Goals - 02/09/16 0944      PT LONG TERM GOAL #1   Title Pt will amb. 1000' on all surfaces modified  independently with minimal to no c/o fatigue.  (03-09-16)   Time 4   Period Weeks   Status New     PT LONG TERM GOAL #2   Title Pt will subjectively report at least 50% improvement in endurance/activity tolerance.  (03-09-16)   Time 4   Period Weeks   Status New     PT LONG TERM GOAL #3   Title Negotiate steps without rail using step over step sequence to demo improved balance.  (03-09-16)   Time 4   Period Weeks   Status New     PT LONG TERM GOAL #4   Title Amb. 200' on flat,even surface with minimal R hip circumduction in swing phase of gait.  (03-09-16)   Time 4   Period Weeks   Status New     PT LONG TERM GOAL #5   Title Increase R SLS to >/= 5 secs to demo improved balance.  (03-09-16)   Time 4   Period Weeks   Status New     Additional Long Term Goals   Additional Long Term Goals Yes     PT LONG TERM  GOAL #6   Title Independent in HEP for RLE strengthening.  (03-09-16)   Time 4   Period Weeks   Status New               Plan - 02/24/16 1649    Clinical Impression Statement Pt progressing well towards goals - RLE is increasing in strength which is helping to minimize gait deviations; clonus noted in RLE with fatigue   Rehab Potential Good   PT Frequency 2x / week   PT Duration 4 weeks   PT Treatment/Interventions ADLs/Self Care Home Management;Therapeutic activities;Stair training;Gait training;Therapeutic exercise;Balance training;Neuromuscular re-education;Patient/family education;Orthotic Fit/Training   PT Next Visit Plan cont with RLE strengthening and endurance activities   PT Home Exercise Plan see pt instructions    Consulted and Agree with Plan of Care Patient   Family Member Consulted wife      Patient will benefit from skilled therapeutic intervention in order to improve the following deficits and impairments:  Abnormal gait, Decreased balance, Decreased activity tolerance, Decreased endurance, Decreased range of motion, Impaired flexibility, Decreased strength  Visit Diagnosis: Other abnormalities of gait and mobility  Muscle weakness (generalized)     Problem List Patient Active Problem List   Diagnosis Date Noted  . Diabetes mellitus type 2 in obese (HCC)   . Obesity   . Right foot drop   . Other vascular headache   . 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 Benign essential HTN   . Cerebral thrombosis with cerebral infarction 01/17/2016  . Type 2 diabetes mellitus with hyperglycemia, without long-term current use of insulin (HCC) 01/17/2016  . Stroke (HCC) 01/17/2016  . Cerebral infarction due to unspecified mechanism 01/15/2016  . Cerebrovascular accident (CVA) due to thrombosis of basilar artery (HCC)   . HLD (hyperlipidemia)   . Tobacco use disorder   . Stroke-like symptoms 01/14/2016  . Acute right-sided weakness 01/14/2016  . AKI (acute kidney  injury) (HCC) 01/14/2016  . Accelerated hypertension 01/14/2016  . Right hemiparesis (HCC) 01/14/2016    Kahlea Cobert, Donavan Burnet, PT 02/24/2016, 4:58 PM  [facility] [address] [place], [place], [address] Phone: [phone]   Fax:  [phone]  Name: [name] [name]. MRN: [id number] Date of Birth: 06/02/1966

## 2016-02-24 NOTE — Therapy (Signed)
[facility] [address] [place], [place], [address] Phone: [phone]   Fax:  [phone]  Occupational Therapy Treatment  Patient Details  Name: [name] RalphHerman Hord Jr. MRN: [id number] Date of Birth: [birth year] Referring Provider: Dr Ammie DaltonA Patel  Encounter Date: 02/24/2016      OT End of Session - 02/24/16 0955    Visit Number 5   Number of Visits 12   Date for OT Re-Evaluation 04/03/16   Authorization Type AETNA Managed   OT Start Time 0848   OT Stop Time 0933   OT Time Calculation (min) 45 min   Equipment Utilized During Treatment Weighted ball - 4.4#; small pegs/pegboard. JAMAR for grip assessment; computer keyboard for typing; cones, 3# & 5# weights.   Activity Tolerance Patient tolerated treatment well   Behavior During Therapy [facility] for tasks assessed/performed      Past Medical History:  Diagnosis Date  . Allergy   . Hernia, umbilical   . Hyperlipidemia   . Hypertension     Past Surgical History:  Procedure Laterality Date  . UVULOPALATOPHARYNGOPLASTY    . VARICOSE VEIN SURGERY      There were no vitals filed for this visit.      Subjective Assessment - 02/24/16 0852    Subjective  "Sorry I overslept" Pt arrived late to appointment.   Pertinent History See EPIC history.    Patient Stated Goals Pt states that current goals are improve fine motor coordination, strength RUE   Currently in Pain? Yes   Pain Score 1    Pain Location Arm   Pain Orientation Right   Pain Descriptors / Indicators Nagging   Pain Type Acute pain   Pain Onset In the past 7 days   Pain Frequency Intermittent            [facility] OT Assessment - 02/24/16 0001      Hand Function   Right Hand Grip (lbs) 61                  OT Treatments/Exercises (OP) - 02/24/16 0001      Fine Motor Coordination   Fine Motor Coordination In hand manipuation training;Small Pegboard   Small Pegboard Coordination ex's R hand  following pattern and in-hand manipulation. Improved coordination and manipulation noted.     Hand Exercises   Other Hand Exercises Handwriting and signing name w/ 100% accuracy as per compared signature on license. Pt reports occasional "hesitation" when beginning to write, no longer having tremors.  Handwritiing - signing name and typing on computer     Fine Motor Coordination   Other Fine Motor Exercises Typing pro: Typing test x2: Gross speed 15 wpm and 18 wpm; accuracy 84%, net speed = 15 wpm. Pt uses laptop at home with autocorrect and notes that this has been slowly improving.   Other Fine Motor Exercises Tossing weighted ball (4.4#) walking and talking around therapy gym & reception area x3. Pt with some difficulty noted w/ multi tasking but was able to adjust and complete w/ Min VC's.      Neurological Re-education Exercises   Scapular Stabilization Right;10 reps;Prone;Seated;Quadraped  Reviewed WB and stabilization ex's/issued handout/reviewed    Shoulder Flexion AROM;Right;10 reps;Standing     Functional Reaching Activities   Mid Level Reaching into cabinets/shelves to retrieve cones; 3# & 5# weight w/o any compensation noted RUE.   High Level Reaching into cabinets/shelves to retrieve cones; 3# & 5# weight w/o any compensation noted R UE.  Weight Bearing Technique   Weight Bearing Technique Yes  Wall push-ups x3 sets of 10      Assessed LTG's as noted. Remaining LTG #4 next visit.          OT Education - 02/24/16 [phone]    Education provided Yes   Education Details HEP - WB and shoulder stabilization ex's, functional activities for home   Person(s) Educated Patient   Methods Explanation;Demonstration;Handout   Comprehension Verbalized understanding;Returned demonstration          OT Short Term Goals - 02/24/16 1002      OT SHORT TERM GOAL #1   Title Pt will be I signs and symptoms CVA/Stroke awareness   Time 3   Period Weeks   Status Achieved            OT Long Term Goals - 02/24/16 9604      OT LONG TERM GOAL #1   Title Pt will be Mod I upgraded HEP for coordination and strength RUE   Time 6   Period Weeks   Status Achieved     OT LONG TERM GOAL #2   Title Pt will demonstrate improved coordinaiton RUE as seen by improved 9 hole peg score by 15 seconds or more   Baseline 56.85 seconds @ eval; 26.35 seconds 02/24/16 (Improved by 30.5 seconds) R hand   Time 6   Period Weeks   Status Achieved     OT LONG TERM GOAL #3   Title Pt will demonstrate improved grip strength RUE as seen by JAMAR grip of 50# or greater R hand   Baseline 37# (RUE position 2) at Eval; 62# RUE 02/24/16.   Time 6   Status Achieved     OT LONG TERM GOAL #5   Title Pt will demonstrate improved coordination as seen by pt reports of typing, texting and signing his name WFL's   Time 6   Period Weeks   Status Achieved               Plan - 02/24/16 1000    Rehab Potential Good   Clinical Impairments Affecting Rehab Potential Pt indicated that he "won't do anything" related to HEP "that I see no reason to do"    OT Frequency 2x / week   OT Duration 6 weeks   OT Treatment/Interventions Self-care/ADL training;Therapeutic exercise;Neuromuscular education;Fluidtherapy;Energy conservation;Building services engineer;Therapeutic exercises;Patient/family education;Balance training;Therapeutic activities;Manual Therapy;DME and/or AE instruction   Plan Check remaining LTG (#4) and review home program. Anticipate d/c next visit to independent home program.   Consulted and Agree with Plan of Care Patient      Patient will benefit from skilled therapeutic intervention in order to improve the following deficits and impairments:  Decreased balance, Decreased endurance, Decreased mobility, Increased edema, Decreased range of motion, Decreased knowledge of precautions, Decreased activity tolerance, Decreased coordination, Decreased knowledge of use of DME, Impaired  flexibility, Impaired UE functional use  Visit Diagnosis: Muscle weakness (generalized)  Other lack of coordination  Hemiplegia and hemiparesis following cerebral infarction affecting right dominant side [facility])    Problem List Patient Active Problem List   Diagnosis Date Noted  . Diabetes mellitus type 2 in obese (HCC)   . Obesity   . Right foot drop   . Other vascular headache   . 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 Benign essential HTN   . Cerebral thrombosis with cerebral infarction 01/17/2016  . Type 2 diabetes mellitus with hyperglycemia, without long-term current use of insulin (  HCC) 01/17/2016  . Stroke (HCC) 01/17/2016  . Cerebral infarction due to unspecified mechanism 01/15/2016  . Cerebrovascular accident (CVA) due to thrombosis of basilar artery (HCC)   . HLD (hyperlipidemia)   . Tobacco use disorder   . Stroke-like symptoms 01/14/2016  . Acute right-sided weakness 01/14/2016  . AKI (acute kidney injury) (HCC) 01/14/2016  . Accelerated hypertension 01/14/2016  . Right hemiparesis (HCC) 01/14/2016    Barnhill, Amy Dionicio Stall, OTR/L 02/24/2016, 10:03 AM  [place] [facility] [address] [place], [place], [address] Phone: [phone]   Fax:  [phone]  Name: [name] [name]. MRN: [id number] Date of Birth: [birth year]

## 2016-02-29 ENCOUNTER — Ambulatory Visit: Payer: Managed Care, Other (non HMO) | Admitting: Physical Therapy

## 2016-02-29 ENCOUNTER — Telehealth: Payer: Self-pay | Admitting: *Deleted

## 2016-02-29 ENCOUNTER — Encounter: Payer: Self-pay | Admitting: *Deleted

## 2016-02-29 ENCOUNTER — Ambulatory Visit: Payer: Managed Care, Other (non HMO) | Admitting: *Deleted

## 2016-02-29 DIAGNOSIS — M6281 Muscle weakness (generalized): Secondary | ICD-10-CM | POA: Diagnosis not present

## 2016-02-29 DIAGNOSIS — R2689 Other abnormalities of gait and mobility: Secondary | ICD-10-CM

## 2016-02-29 DIAGNOSIS — I69351 Hemiplegia and hemiparesis following cerebral infarction affecting right dominant side: Secondary | ICD-10-CM

## 2016-02-29 DIAGNOSIS — R278 Other lack of coordination: Secondary | ICD-10-CM

## 2016-02-29 NOTE — Therapy (Signed)
[place] [address] [place], [place], [address] Phone: [phone]   Fax:  [phone]  Physical Therapy Treatment  Patient Details  Name: [name] [name]. MRN: [id number] Date of Birth: December 19, 1965 Referring Provider: Dr. Stephannie Li  Encounter Date: 02/29/2016      PT End of Session - 03/01/16 1741    Visit Number 6   Number of Visits 9   Date for PT Re-Evaluation 03/09/16   Authorization Type Aetna   PT Start Time [phone]   PT Stop Time 1015   PT Time Calculation (min) 44 min      Past Medical History:  Diagnosis Date  . Allergy   . Hernia, umbilical   . Hyperlipidemia   . Hypertension     Past Surgical History:  Procedure Laterality Date  . UVULOPALATOPHARYNGOPLASTY    . VARICOSE VEIN SURGERY      There were no vitals filed for this visit.      Subjective Assessment - 03/01/16 1735    Subjective Pt reports he is going back to work on Wed., and then is going to [place] for work on Sunday, 03-05-16   Pertinent History DM Type 2: HTN:  cerebral infarction due to thrombosis of basilar artery; acute kidney injury   Patient Stated Goals Increase strength in R leg and increase stamina; wants to return to work   Currently in Pain? No/denies         Manual muscle test; R hamstrings - 4-/5;  R gastroc 3+/5           [facility] Adult PT Treatment/Exercise - 03/01/16 0001      Ambulation/Gait   Ambulation/Gait Yes   Ambulation/Gait Assistance 6: Modified independent (Device/Increase time)   Ambulation Distance (Feet) 125 Feet   Assistive device None   Gait Pattern Decreased hip/knee flexion - right;Right circumduction;Step-through pattern  Decr. dorsiflexion R foot and decr. push off in stance   Gait velocity 8.62 = 3.81 ft/sec   Stairs Yes   Stairs Assistance 6: Modified independent (Device/Increase time)   Stair Management Technique No rails;Alternating pattern;Forwards   Number of Stairs  4   Height of Stairs 6     Standardized Balance Assessment   Standardized Balance Assessment Timed Up and Go Test     Timed Up and Go Test   Normal TUG (seconds) 9.01  no device     Knee/Hip Exercises: Aerobic   Elliptical 1" forward, 1" backward level 1.5     Knee/Hip Exercises: Machines for Strengthening   Cybex Leg Press bil. LE's 110# 10 reps;  LLE only 10 reps 90#     Knee/Hip Exercises: Standing   Heel Raises Right;1 set;10 reps     Pt performed R plantarflexion strengthening ex off side of high/low table - 2 sets 10 reps for closed chain strengthening  R knee flexion prone with 5# 2 sets 10 reps R hip extension prone - no weight - knee flexed at 90 degrees for control - 10 reps   NeuroRe-ed:  Amb. 10' x 4 reps inside // bars - knees flexed - in plantarflexed position for hamstring and gastroc strengthening Amb. Sideways 10' x 2 reps with knees flexed - in plantarflexed position              PT Long Term Goals - 03/01/16 1736      PT LONG TERM GOAL #1   Title Pt will amb. 1000' on all surfaces modified independently with minimal to  no c/o fatigue.  (03-09-16)   Baseline met 02-29-16   Status Achieved     PT LONG TERM GOAL #2   Title Pt will subjectively report at least 50% improvement in endurance/activity tolerance.  (03-09-16)   Baseline met 02-29-16   Status Achieved     PT LONG TERM GOAL #3   Title Negotiate steps without rail using step over step sequence to demo improved balance.  (03-09-16)   Baseline met 02-29-16   Status Achieved     PT LONG TERM GOAL #4   Title Amb. 200' on flat,even surface with minimal R hip circumduction in swing phase of gait.  (03-09-16)   Baseline Inconsistent in occurrence - deviations increase with fatigue and with incr. speed (02-29-16)   Status Partially Met     PT LONG TERM GOAL #5   Title Increase R SLS to >/= 5 secs to demo improved balance.  (03-09-16)   Baseline >10 secs on RLE - 02-29-16   Status Achieved     PT  LONG TERM GOAL #6   Title Independent in HEP for RLE strengthening.  (03-09-16)   Status Achieved               Plan - 03/01/16 1742    Clinical Impression Statement Pt has met all LTG's - gait deviations increase with fatigue but able to minimize deviations with concentration and rest periods as needed   Rehab Potential Good   PT Frequency 2x / week   PT Duration 4 weeks   PT Treatment/Interventions ADLs/Self Care Home Management;Therapeutic activities;Stair training;Gait training;Therapeutic exercise;Balance training;Neuromuscular re-education;Patient/family education;Orthotic Fit/Training   PT Next Visit Plan cont with RLE strengthening and endurance activities   PT Home Exercise Plan see pt instructions    Consulted and Agree with Plan of Care Patient      Patient will benefit from skilled therapeutic intervention in order to improve the following deficits and impairments:  Abnormal gait, Decreased balance, Decreased activity tolerance, Decreased endurance, Decreased range of motion, Impaired flexibility, Decreased strength  Visit Diagnosis: Muscle weakness (generalized)  Other abnormalities of gait and mobility     Problem List Patient Active Problem List   Diagnosis Date Noted  . Diabetes mellitus type 2 in obese ([place])   . Obesity   . Right foot drop   . Other vascular headache   . Left pontine CVA ([place]) 01/20/2016  . Benign essential HTN   . Cerebral thrombosis with cerebral infarction 01/17/2016  . Type 2 diabetes mellitus with hyperglycemia, without long-term current use of insulin ([place]) 01/17/2016  . Stroke ([place]) 01/17/2016  . Cerebral infarction due to unspecified mechanism 01/15/2016  . Cerebrovascular accident (CVA) due to thrombosis of basilar artery ([place])   . HLD (hyperlipidemia)   . Tobacco use disorder   . Stroke-like symptoms 01/14/2016  . Acute right-sided weakness 01/14/2016  . AKI (acute kidney injury) ([place]) 01/14/2016  . Accelerated  hypertension 01/14/2016  . Right hemiparesis ([place]) 01/14/2016     PHYSICAL THERAPY DISCHARGE SUMMARY  Visits from Start of Care: 6  Current functional level related to goals / functional outcomes: See above for progress towards goals   Remaining deficits: Continued weakness in R hamstrings and gastrocs, resulting in gait deviations which increase/worsen with fatigue   Education / Equipment: Pt  Has been educated in HEP for RLE strengthening Plan: Patient agrees to discharge.  Patient goals were partially met. Patient is being discharged due to the patient's request.  ?????   Pt  requesting D/C from PT 1 week early due to plans to return to work on 03-01-16.          Alda Lea, PT 03/01/2016, 5:48 PM  [place] [address] [place], [place], [address] Phone: [phone]   Fax:  [phone]  Name: Azim Gillingham. MRN: [id number] Date of Birth: 11/11/1965

## 2016-02-29 NOTE — Telephone Encounter (Signed)
On your des's k.

## 2016-02-29 NOTE — Telephone Encounter (Signed)
Spoke to pt and he stated he did see therapist this am and was able to lift 50lbs with his bad arm.  We have not heard from them as far as I know. Will have to get back with him after speaking with CM/NP.  [facility] re: pt.  Pt was seen by OT today.  He told her that he did pick up 50lbs with his R hand.  He has been discharged by both OT and PT.  He is due to go back to work tomorrow.  Form completed by CM/NP, signed by Dr. Roda ShuttersXu.  Given to Stanton KidneyDebra in MR. Pt to pick up.

## 2016-02-29 NOTE — Therapy (Signed)
[facility] [address] [place], [place], [address] Phone: [phone]   Fax:  [phone]  Occupational Therapy Treatment  Patient Details  Name: [name] [name]. MRN: [id number] Date of Birth: 06-12-1966 Referring Provider: Dr Hulda Humphrey  Encounter Date: 02/29/2016      OT End of Session - 02/29/16 0901    Visit Number 6   Number of Visits 12   Date for OT Re-Evaluation 04/03/16   Authorization Type [employer] Managed   OT Start Time 0757   OT Stop Time 0848   OT Time Calculation (min) 51 min   Equipment Utilized During Treatment Weighted ball - 4.4#; Green putty, UBE, JAMAR for grip assessment; lifting 55# weight with bilateral hands and 1 handed in prep fpr return for work related tasks.   Activity Tolerance Patient tolerated treatment well   Behavior During Therapy [facility] for tasks assessed/performed      Past Medical History:  Diagnosis Date  . Allergy   . Hernia, umbilical   . Hyperlipidemia   . Hypertension     Past Surgical History:  Procedure Laterality Date  . UVULOPALATOPHARYNGOPLASTY    . VARICOSE VEIN SURGERY      There were no vitals filed for this visit.      Subjective Assessment - 02/29/16 0801    Subjective  Pt reports that he is doing well. States "My blood pressure is good" Pt reports that he is independently performing his HEP RUE.   Pertinent History See EPIC history.    Patient Stated Goals Pt states that current goals are improve fine motor coordination, strength RUE   Currently in Pain? No/denies   Pain Score 2    Pain Location Arm   Pain Orientation Right   Pain Descriptors / Indicators Nagging   Pain Type Acute pain   Pain Onset In the past 7 days   Multiple Pain Sites Yes   Pain Score 3   Pain Location Knee   Pain Orientation Left   Pain Descriptors / Indicators Nagging;Discomfort   Pain Type Acute pain   Pain Onset In the past 7 days   Pain Frequency Intermittent             [facility] OT Assessment - 02/29/16 0001      Sensation   Light Touch Appears Intact     Coordination   Gross Motor Movements are Fluid and Coordinated Yes   Fine Motor Movements are Fluid and Coordinated Yes   Other 3 Pt Pinch strength R = 22  L = 29 Lateral Pinch R = 27 L= 27   Coordination Improving per pt report and functional use.     ROM / Strength   AROM / PROM / Strength Strength     Hand Function   Right Hand Grip (lbs) 71   Left Hand Grip (lbs) 94                  OT Treatments/Exercises (OP) - 02/29/16 0001      ADLs   Overall ADLs Pt reports that he is I ADL's with improvement in ability to do every day activities including brushing his teeth, yard work Equities trader). He reports that things may sometimes takes him longer but he is noticing improvement and less tremors.   Writing WNL's - signature compared to liscense signature = the same     Exercises   Exercises Work Hardening     Hand Exercises   Other Hand  Exercises Grip and pinches with green theraputty. Encourage use at home as part of HEP as originally issued. Pt states that he "Uses it doing other things"     Fine Motor Coordination   Other Fine Motor Exercises Review HEP for coordination, typing, writing and overall functional use. Pt demonstrates independence. Anticipate that coordination witll cont to improve over time as pt uses hand functionally at home/work.   Other Fine Motor Exercises Pt states that he doesn't use putty - encourage use for assist with grip/pinch strength and assist with coordination. Pt verbalizes understanding.     Work Chief Strategy Officer from Floor to Waist (lbs/reps) 55#  Lifted 55# squat position using bilateralUE & then 1 handed      Visual/Perceptual Exercises   Scanning Environmental   Scanning - Environmental Scanning environment while tossing a ball and answering questions. Pt initially missed 3 out of 21 cards but was able to get 21/21  cards on second attempt in busy environment today in clinic.     Neurological Re-education Exercises   Reciprocal Movements Weighted ball, tossing, walking and talking RUE, while scanning environment and answering questions.   Reciprocal Movements UBE RUE only level 9 rotating forward and backward maintaining steady pace of 24 watts x10 min                 OT Education - 02/29/16 0858    Education Details Emphasized importance of performance of HEP especially for coordination, shoulder strengthening. Pt to cont. HEP I'ly at this time. Reivewed signs and symptoms of CVA w/ pt today. Pt will be d/c'd as he is [place] to [place] starting on 03[place]01[place]16. He is overall Mod I HEP and verbalized understanding/agreement with this.    Person(s) Educated Patient   Methods Explanation;Demonstration   Comprehension Verbalized understanding;Returned demonstration          OT Short Term Goals - 02/24/16 1002      OT SHORT TERM GOAL #1   Title Pt will be I signs and symptoms CVA/Stroke awareness   Time 3   Period Weeks   Status Achieved           OT Long Term Goals - 02/29/16 0908      OT LONG TERM GOAL #1   Title Pt will be Mod I upgraded HEP for coordination and strength RUE   Time 6   Period Weeks   Status Achieved     OT LONG TERM GOAL #2   Title Pt will demonstrate improved coordinaiton RUE as seen by improved 9 hole peg score by 15 seconds or more   Baseline 56.85 seconds @ eval; 26.35 seconds 02/24/16 (Improved by 30.5 seconds) R hand   Time 6   Period Weeks   Status Achieved     OT LONG TERM GOAL #3   Title Pt will demonstrate improved grip strength RUE as seen by JAMAR grip of 50# or greater R hand   Baseline 37# (RUE position 2) at Eval; 62# RUE 02/24/16.   Time 6   Period Weeks   Status Achieved     OT LONG TERM GOAL #4   Title Pt will demonstrate gross RUE/shoulder strength of 4/5 or greater as seen by MMT   Baseline -3/5 RUE; 02/28/16 = 4/5 RUE    Time 6   Period Weeks   Status New     OT LONG TERM GOAL #5   Title Pt will demonstrate improved coordination as seen  by pt reports of typing, texting and signing his name WFL's   Time 6   Period Weeks   Status Achieved               Plan - 02/29/16 0902    Clinical Impression Statement Pt has met 5/5 LTG's related to RUE. He should benefit from cont w/ HEP I'ly as it is anticipated that he will cont to gain strength in R shoulder (currently 4/5 RUE vs 5/5 LUE) and for coordination, work and homemaking related activities. Pt plans to return to work starting tomorrow, and will be d/c's at this time.   Rehab Potential Good   Clinical Impairments Affecting Rehab Potential Pt indicated that he "won't do anything" related to HEP "that I see no reason to do"    OT Frequency 2x / week   OT Duration 6 weeks   OT Treatment/Interventions Self-care/ADL training;Therapeutic exercise;Neuromuscular education;Fluidtherapy;Energy conservation;Therapist, nutritional;Therapeutic exercises;Patient/family education;Balance training;Therapeutic activities;Manual Therapy;DME and/or AE instruction   Plan Pt has met 5/5 LTG's, recommend cont with HEP I'ly and f/u PRN w/ MD. Will discharge at this time.   Consulted and Agree with Plan of Care Patient      Patient will benefit from skilled therapeutic intervention in order to improve the following deficits and impairments:  Decreased balance, Decreased endurance, Decreased mobility, Increased edema, Decreased range of motion, Decreased knowledge of precautions, Decreased activity tolerance, Decreased coordination, Decreased knowledge of use of DME, Impaired flexibility, Impaired UE functional use  Visit Diagnosis: Muscle weakness (generalized)  Hemiplegia and hemiparesis following cerebral infarction affecting right dominant side (HCC)  Other lack of coordination    Problem List Patient Active Problem List   Diagnosis Date Noted  . Diabetes  mellitus type 2 in obese ([place])   . Obesity   . Right foot drop   . Other vascular headache   . Left pontine CVA ([place]) 01/20/2016  . Benign essential HTN   . Cerebral thrombosis with cerebral infarction 01/17/2016  . Type 2 diabetes mellitus with hyperglycemia, without long-term current use of insulin ([place]) 01/17/2016  . Stroke ([place]) 01/17/2016  . Cerebral infarction due to unspecified mechanism 01/15/2016  . Cerebrovascular accident (CVA) due to thrombosis of basilar artery ([place])   . HLD (hyperlipidemia)   . Tobacco use disorder   . Stroke-like symptoms 01/14/2016  . Acute right-sided weakness 01/14/2016  . AKI (acute kidney injury) ([place]) 01/14/2016  . Accelerated hypertension 01/14/2016  . Right hemiparesis ([place]) 01/14/2016   OCCUPATIONAL THERAPY DISCHARGE SUMMARY  Visits from Start of Care: 6  Current functional level related to goals / functional outcomes: Pt has met 5/5 Long Term Goals for out-pt OT. Please see note above for details and current status.   Remaining deficits: Pt is currently 4/5 RUE shoulder strength and WFL's for coordination. He should benefit from cont performance of HEP related to these issues for continued strengthening related to home/work.    Education / Equipment: Cont HEP; Use green putty and theraband for shoulder strengthening. Use RUE functionally for everyday activities as able. Plan: Patient agrees to discharge.  Patient goals were met. Patient is being discharged due to being pleased with the current functional level.  ?????       [address], Amy Beth Dixon, OTR/L 02/29/2016, 9:11 AM  [facility] [address] [place], [place], [address] Phone: [phone]   Fax:  [phone]  Name: [name] [name]. MRN: [id number] Date of Birth: March 06, 1966

## 2016-03-01 ENCOUNTER — Telehealth: Payer: Self-pay | Admitting: *Deleted

## 2016-03-01 NOTE — Telephone Encounter (Signed)
Pt called wanting to know if his letter was ready for pick up. Please call and advise

## 2016-03-01 NOTE — Telephone Encounter (Signed)
[name] KidneyDebra in MR spoke to pt to let him know ready for pick up and fee $50.00.

## 2016-03-01 NOTE — Telephone Encounter (Signed)
Pt form at the front desk for pick up. 

## 2016-03-02 DIAGNOSIS — Z0289 Encounter for other administrative examinations: Secondary | ICD-10-CM

## 2016-03-06 ENCOUNTER — Ambulatory Visit: Payer: Managed Care, Other (non HMO) | Admitting: Physical Therapy

## 2016-03-06 ENCOUNTER — Encounter: Payer: Managed Care, Other (non HMO) | Admitting: *Deleted

## 2016-03-09 ENCOUNTER — Encounter: Payer: Managed Care, Other (non HMO) | Admitting: *Deleted

## 2016-03-09 ENCOUNTER — Ambulatory Visit: Payer: Managed Care, Other (non HMO) | Admitting: Physical Therapy

## 2016-03-13 ENCOUNTER — Encounter: Payer: Managed Care, Other (non HMO) | Admitting: *Deleted

## 2016-03-13 ENCOUNTER — Ambulatory Visit: Payer: Managed Care, Other (non HMO) | Admitting: Physical Therapy

## 2016-03-30 ENCOUNTER — Ambulatory Visit: Payer: Self-pay | Admitting: Nurse Practitioner

## 2016-04-17 ENCOUNTER — Encounter: Payer: Self-pay | Admitting: Nurse Practitioner

## 2016-04-17 ENCOUNTER — Ambulatory Visit (INDEPENDENT_AMBULATORY_CARE_PROVIDER_SITE_OTHER): Payer: Managed Care, Other (non HMO) | Admitting: Nurse Practitioner

## 2016-04-17 VITALS — BP 130/80 | HR 93 | Ht 71.0 in | Wt 244.4 lb

## 2016-04-17 DIAGNOSIS — I6302 Cerebral infarction due to thrombosis of basilar artery: Secondary | ICD-10-CM | POA: Diagnosis not present

## 2016-04-17 DIAGNOSIS — E669 Obesity, unspecified: Secondary | ICD-10-CM | POA: Diagnosis not present

## 2016-04-17 DIAGNOSIS — I10 Essential (primary) hypertension: Secondary | ICD-10-CM | POA: Diagnosis not present

## 2016-04-17 DIAGNOSIS — F17200 Nicotine dependence, unspecified, uncomplicated: Secondary | ICD-10-CM | POA: Diagnosis not present

## 2016-04-17 DIAGNOSIS — E785 Hyperlipidemia, unspecified: Secondary | ICD-10-CM

## 2016-04-17 DIAGNOSIS — E1169 Type 2 diabetes mellitus with other specified complication: Secondary | ICD-10-CM

## 2016-04-17 NOTE — Patient Instructions (Signed)
Continue aspirin for secondary stroke prevention Maintain strict control of hypertension with blood pressure goal below 130/90, today's reading 130/80 continue antihypertensive medications Control of diabetes with hemoglobin A1c below 7 followed by primary care continue diabetic medication Cholesterol with LDL cholesterol less than 70, followed by primary care,  continue Lipitor Continue exercise,  eat healthy diet with whole grains,  fresh fruits and vegetables Stop smoking completely Follow-up in 6 months

## 2016-04-17 NOTE — Progress Notes (Signed)
[facility]  PATIENT: [name] [name]. DOB: Sep 03, 1965   REASON FOR VISIT:follow-up for history of stroke right pontine infarction secondary to small vessel disease HISTORY FROM: Patient     HISTORY OF PRESENT ILLNESS:UPDATE 04/17/16 [name] [name], 50 year old male returns for follow-up. He has a history of small left pontine infarct in August 2017. He is currently on aspirin without further stroke or TIA symptoms. He has not had any bruising or bleeding. Blood pressure is well controlled on the office today at 130/80. He remains on Lipitor for secondary stroke prevention and cholesterol without complaints of myalgias. He continues to smoke and was encouraged to stop completely. He is newly diagnosed diabetic and is trying to watch his diet as well as get into a regular exercise routine. He returns for reevaluation  HISTORY 02/16/16 CMHerman Kagel [name] an 50 y.o.malewho was recently admitted to the hospital with a small left pontine infarct. He was discharged to home yesterday. His initial presentation was that of right-sided weakness. He was hospitalized from 01/14/2016 to08/11/2015. He underwent full evaluation including MRI of the brain as well as CT angiogram. 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 Echocardiogram had also been requested but there is no report in the chart at this time [name] [name] presents today 01/17/2016 reporting that around 9:00 last night 01/16/2016 he started to notice some heaviness in his right upper and lower extremities. By this morning he noted the symptoms had progressed and he was barely able to move his right upper or lower extremities he also reported slurring of speech. He notes a right facial droop as well. On direct exam [name] [name] is actually able to lift his right arm against gravity as well as his right lower extremity but he reports that feel very  heavy he is noted to have slurring of speech that does not appear a phasic she also has a right facial droop. Patient was not administered IV t-PA secondary to recent stroke, not within the window. He was admitted  Right pontine infarct with evolution, initial infarct secondary to small vessel disease  .  Worsening of symptoms from stroke last week possibly related to dehydration and over-exertion. Worsening of R hemiparesis, dysarthria  MRI  L pontine infarct unchanged from MRI last week. No new infarct. small vessel disease. MRA  No significant flow limiting stenosis LDL 77. HgbA1c 8.5 he returns today for follow-up. He is continuing to get occupational therapy and speech therapy. His weakness is much better he has a new diagnosis of diabetes. He claims he mowed the grass  yesterday at his home. He continues to smoke but has cut down from 3 packs a day to 1 pack a day. He is on aspirin for secondary stroke prevention without bruising. He is on Lipitor. Blood pressure is 142/78 in the office today. He returns for reevaluation  REVIEW OF SYSTEMS: Full 14 system review of systems performed and notable only for those listed, all others are neg:  Constitutional: Fatigue  Cardiovascular: neg Ear/Nose/Throat: neg  Skin: neg Eyes: neg Respiratory: neg Gastroitestinal: neg  Hematology/Lymphatic: neg  Endocrine: neg Musculoskeletal: Joint pain Allergy/Immunology: neg Neurological: neg Psychiatric: neg Sleep : neg   ALLERGIES: Allergies  Allergen Reactions  . Morphine And Related Itching    HOME MEDICATIONS: Outpatient Medications Prior to Visit  Medication Sig Dispense Refill  . aspirin 325 MG tablet Take 1 tablet (325 mg  total) by mouth daily. 30 tablet 0  . atorvastatin (LIPITOR) 20 MG tablet Take 1 tablet (20 mg total) by mouth daily at 6 PM. 30 tablet 0  . blood glucose meter kit and supplies Dispense based on patient and insurance preference. Use up to four times daily as directed. (FOR  ICD-9 250.00, 250.01). 180 each 0  . chlorthalidone (HYGROTON) 25 MG tablet Take 1 tablet (25 mg total) by mouth daily. 30 tablet 1  . FLUoxetine (PROZAC) 20 MG capsule Take 1 capsule (20 mg total) by mouth daily. 30 capsule 3  . lisinopril (PRINIVIL,ZESTRIL) 40 MG tablet Take 1 tablet (40 mg total) by mouth daily. 30 tablet 1  . metFORMIN (GLUCOPHAGE) 1000 MG tablet Take 1 tablet (1,000 mg total) by mouth 2 (two) times daily with a meal. (Patient taking differently: Take 1,000 mg by mouth daily with breakfast. ) 60 tablet 1   No facility-administered medications prior to visit.     PAST MEDICAL HISTORY: Past Medical History:  Diagnosis Date  . Allergy   . Hernia, umbilical   . Hyperlipidemia   . Hypertension     PAST SURGICAL HISTORY: Past Surgical History:  Procedure Laterality Date  . UVULOPALATOPHARYNGOPLASTY    . VARICOSE VEIN SURGERY      FAMILY HISTORY: Family History  Problem Relation Age of Onset  . Heart attack Father     SOCIAL HISTORY: Social History   Social History  . Marital status: Married    Spouse name: N/A  . Number of children: N/A  . Years of education: N/A   Occupational History  . Not on file.   Social History Main Topics  . Smoking status: Current Every Day Smoker    Packs/day: 2.00  . Smokeless tobacco: Never Used  . Alcohol use Yes  . Drug use: No  . Sexual activity: Yes   Other Topics Concern  . Not on file   Social History Narrative  . No narrative on file     PHYSICAL EXAM  Vitals:   04/17/16 1326  BP: (!) 147/92  Pulse: 93  Weight: 244 lb 6.4 oz (110.9 kg)  Height: 5' 11"  (1.803 m)   Body mass index is 34.09 kg/m.  Generalized: Well developed, Obese male in no acute distress  Head: normocephalic and atraumatic,. Oropharynx benign  Neck: Supple, no carotid bruits  Cardiac: Regular rate rhythm, no murmur  Musculoskeletal: No deformity   Neurological examination   Mentation: Alert oriented to time, place,  history taking. Attention span and concentration appropriate. Recent and remote memory intact.  Follows all commands speech and language fluent. No dysarthria   Cranial nerve II-XII: Pupils were equal round reactive to light extraocular movements were full, visual field were full on confrontational test. Facial sensation and strength were normal. hearing was intact to finger rubbing bilaterally. Uvula tongue midline. head turning and shoulder shrug were normal and symmetric.Tongue protrusion into cheek strength was normal. Motor: normal bulk and tone, full strength in the BUE, BLE.  Sensory: normal and symmetric to light touch, pinprick, and  Vibratory in  the upper and lower extremities Coordination: finger-nose-finger, heel-to-shin bilaterally, no dysmetria Reflexes: Brachioradialis 2/2, biceps 2/2, triceps 2/2, patellar 2/2, Achilles 2/2, plantar responses were flexor bilaterally. Gait and Station: Rising up from seated position without assistance, normal stance,  moderate stride, good arm swing, smooth turning, able to perform tiptoe, and heel walking without difficulty. Tandem gait is steady  DIAGNOSTIC DATA (LABS, IMAGING, TESTING) - I reviewed patient records,  labs, notes, testing and imaging myself where available.  Lab Results  Component Value Date   WBC 10.3 01/31/2016   HGB 14.4 01/31/2016   HCT 44.2 01/31/2016   MCV 95.5 01/31/2016   PLT 278 01/31/2016      Component Value Date/Time   NA 138 01/31/2016 1227   K 3.8 01/31/2016 1227   CL 101 01/31/2016 1227   CO2 27 01/31/2016 1227   GLUCOSE 106 (H) 01/31/2016 1227   BUN 27 (H) 01/31/2016 1227   CREATININE 0.99 01/31/2016 1227   CREATININE 0.67 04/17/2014 1202   CALCIUM 9.8 01/31/2016 1227   PROT 6.5 01/21/2016 0530   ALBUMIN 3.5 01/21/2016 0530   AST 47 (H) 01/21/2016 0530   ALT 53 01/21/2016 0530   ALKPHOS 63 01/21/2016 0530   BILITOT 1.1 01/21/2016 0530   GFRNONAA >60 01/31/2016 1227   GFRAA >60 01/31/2016 1227    Lab Results  Component Value Date   CHOL 139 01/15/2016   HDL 37 (L) 01/15/2016   LDLCALC 77 01/15/2016   TRIG 127 01/15/2016   CHOLHDL 3.8 01/15/2016   Lab Results  Component Value Date   HGBA1C 8.5 (H) 01/15/2016    ASSESSMENT AND PLAN  50 y.o. year old male  has a past medical history of Allergy; Hernia, umbilical; Hyperlipidemia; and Hypertension. here to follow up for  Right pontine infarct with evolution, initial infarct secondary to small vessel disease  .   MRA  No significant flow limiting stenosis LDL 77 HgbA1c 8.5 .  PLAN: Stressed the importance of management of risk factors to prevent further stroke Continue aspirin for secondary stroke prevention Maintain strict control of hypertension with blood pressure goal below 130/90, today's reading 130/80 continue antihypertensive medications Control of diabetes with hemoglobin A1c below 7 followed by primary care continue diabetic medication Cholesterol with LDL cholesterol less than 70, followed by primary care,  continue Lipitor Continue exercise,  eat healthy diet with whole grains,  fresh fruits and vegetables Stop smoking completely Follow-up in 6 months May need sleep study Dennie Bible, [facility], [facility], APRN  [facility] [address] [place], [place] [address] [phone]

## 2016-04-18 NOTE — Progress Notes (Signed)
I agree with the above plan 

## 2016-05-12 ENCOUNTER — Encounter: Payer: Managed Care, Other (non HMO) | Admitting: Physical Medicine & Rehabilitation

## 2016-05-22 ENCOUNTER — Ambulatory Visit: Payer: Managed Care, Other (non HMO) | Admitting: Nurse Practitioner

## 2016-06-17 ENCOUNTER — Inpatient Hospital Stay (HOSPITAL_COMMUNITY)
Admission: AD | Admit: 2016-06-17 | Discharge: 2016-07-13 | DRG: 872 | Disposition: E | Payer: Managed Care, Other (non HMO) | Source: Other Acute Inpatient Hospital | Attending: Internal Medicine | Admitting: Internal Medicine

## 2016-06-17 ENCOUNTER — Inpatient Hospital Stay (HOSPITAL_COMMUNITY): Payer: Managed Care, Other (non HMO)

## 2016-06-17 ENCOUNTER — Encounter (HOSPITAL_COMMUNITY): Payer: Self-pay | Admitting: Family Medicine

## 2016-06-17 DIAGNOSIS — E1169 Type 2 diabetes mellitus with other specified complication: Secondary | ICD-10-CM | POA: Diagnosis not present

## 2016-06-17 DIAGNOSIS — Z8249 Family history of ischemic heart disease and other diseases of the circulatory system: Secondary | ICD-10-CM | POA: Diagnosis not present

## 2016-06-17 DIAGNOSIS — L89152 Pressure ulcer of sacral region, stage 2: Secondary | ICD-10-CM | POA: Diagnosis present

## 2016-06-17 DIAGNOSIS — S069X9S Unspecified intracranial injury with loss of consciousness of unspecified duration, sequela: Secondary | ICD-10-CM

## 2016-06-17 DIAGNOSIS — R000 Tachycardia, unspecified: Secondary | ICD-10-CM | POA: Diagnosis present

## 2016-06-17 DIAGNOSIS — B965 Pseudomonas (aeruginosa) (mallei) (pseudomallei) as the cause of diseases classified elsewhere: Secondary | ICD-10-CM | POA: Diagnosis present

## 2016-06-17 DIAGNOSIS — Z930 Tracheostomy status: Secondary | ICD-10-CM

## 2016-06-17 DIAGNOSIS — E785 Hyperlipidemia, unspecified: Secondary | ICD-10-CM | POA: Diagnosis present

## 2016-06-17 DIAGNOSIS — R0682 Tachypnea, not elsewhere classified: Secondary | ICD-10-CM

## 2016-06-17 DIAGNOSIS — S069XAA Unspecified intracranial injury with loss of consciousness status unknown, initial encounter: Secondary | ICD-10-CM | POA: Diagnosis present

## 2016-06-17 DIAGNOSIS — Z515 Encounter for palliative care: Secondary | ICD-10-CM | POA: Diagnosis not present

## 2016-06-17 DIAGNOSIS — Z931 Gastrostomy status: Secondary | ICD-10-CM

## 2016-06-17 DIAGNOSIS — Z79899 Other long term (current) drug therapy: Secondary | ICD-10-CM | POA: Diagnosis not present

## 2016-06-17 DIAGNOSIS — E119 Type 2 diabetes mellitus without complications: Secondary | ICD-10-CM | POA: Diagnosis present

## 2016-06-17 DIAGNOSIS — R7881 Bacteremia: Secondary | ICD-10-CM | POA: Diagnosis present

## 2016-06-17 DIAGNOSIS — Z7982 Long term (current) use of aspirin: Secondary | ICD-10-CM | POA: Diagnosis not present

## 2016-06-17 DIAGNOSIS — E669 Obesity, unspecified: Secondary | ICD-10-CM

## 2016-06-17 DIAGNOSIS — Z6828 Body mass index (BMI) 28.0-28.9, adult: Secondary | ICD-10-CM

## 2016-06-17 DIAGNOSIS — D649 Anemia, unspecified: Secondary | ICD-10-CM | POA: Diagnosis present

## 2016-06-17 DIAGNOSIS — Z7984 Long term (current) use of oral hypoglycemic drugs: Secondary | ICD-10-CM | POA: Diagnosis not present

## 2016-06-17 DIAGNOSIS — S06369D Traumatic hemorrhage of cerebrum, unspecified, with loss of consciousness of unspecified duration, subsequent encounter: Secondary | ICD-10-CM

## 2016-06-17 DIAGNOSIS — Z9889 Other specified postprocedural states: Secondary | ICD-10-CM

## 2016-06-17 DIAGNOSIS — Z66 Do not resuscitate: Secondary | ICD-10-CM | POA: Diagnosis present

## 2016-06-17 DIAGNOSIS — R414 Neurologic neglect syndrome: Secondary | ICD-10-CM | POA: Diagnosis present

## 2016-06-17 DIAGNOSIS — L89309 Pressure ulcer of unspecified buttock, unspecified stage: Secondary | ICD-10-CM

## 2016-06-17 DIAGNOSIS — I69351 Hemiplegia and hemiparesis following cerebral infarction affecting right dominant side: Secondary | ICD-10-CM

## 2016-06-17 DIAGNOSIS — S069X9A Unspecified intracranial injury with loss of consciousness of unspecified duration, initial encounter: Secondary | ICD-10-CM

## 2016-06-17 DIAGNOSIS — E871 Hypo-osmolality and hyponatremia: Secondary | ICD-10-CM | POA: Diagnosis present

## 2016-06-17 DIAGNOSIS — J40 Bronchitis, not specified as acute or chronic: Secondary | ICD-10-CM | POA: Diagnosis present

## 2016-06-17 DIAGNOSIS — E46 Unspecified protein-calorie malnutrition: Secondary | ICD-10-CM | POA: Diagnosis present

## 2016-06-17 DIAGNOSIS — F329 Major depressive disorder, single episode, unspecified: Secondary | ICD-10-CM | POA: Diagnosis present

## 2016-06-17 DIAGNOSIS — A419 Sepsis, unspecified organism: Principal | ICD-10-CM | POA: Diagnosis present

## 2016-06-17 DIAGNOSIS — Z87891 Personal history of nicotine dependence: Secondary | ICD-10-CM

## 2016-06-17 DIAGNOSIS — I10 Essential (primary) hypertension: Secondary | ICD-10-CM | POA: Diagnosis not present

## 2016-06-17 DIAGNOSIS — S069X6S Unspecified intracranial injury with loss of consciousness greater than 24 hours without return to pre-existing conscious level with patient surviving, sequela: Secondary | ICD-10-CM | POA: Diagnosis not present

## 2016-06-17 DIAGNOSIS — I639 Cerebral infarction, unspecified: Secondary | ICD-10-CM | POA: Diagnosis present

## 2016-06-17 DIAGNOSIS — L8990 Pressure ulcer of unspecified site, unspecified stage: Secondary | ICD-10-CM | POA: Diagnosis present

## 2016-06-17 DIAGNOSIS — I6350 Cerebral infarction due to unspecified occlusion or stenosis of unspecified cerebral artery: Secondary | ICD-10-CM

## 2016-06-17 DIAGNOSIS — S069X0S Unspecified intracranial injury without loss of consciousness, sequela: Secondary | ICD-10-CM | POA: Diagnosis not present

## 2016-06-17 DIAGNOSIS — Z7189 Other specified counseling: Secondary | ICD-10-CM | POA: Diagnosis not present

## 2016-06-17 LAB — MAGNESIUM: Magnesium: 1.6 mg/dL — ABNORMAL LOW (ref 1.7–2.4)

## 2016-06-17 LAB — GLUCOSE, CAPILLARY: Glucose-Capillary: 158 mg/dL — ABNORMAL HIGH (ref 65–99)

## 2016-06-17 LAB — COMPREHENSIVE METABOLIC PANEL
ALBUMIN: 2.6 g/dL — AB (ref 3.5–5.0)
ALT: 101 U/L — ABNORMAL HIGH (ref 17–63)
ANION GAP: 13 (ref 5–15)
AST: 46 U/L — ABNORMAL HIGH (ref 15–41)
Alkaline Phosphatase: 153 U/L — ABNORMAL HIGH (ref 38–126)
BILIRUBIN TOTAL: 0.7 mg/dL (ref 0.3–1.2)
BUN: 31 mg/dL — ABNORMAL HIGH (ref 6–20)
CO2: 23 mmol/L (ref 22–32)
Calcium: 9.4 mg/dL (ref 8.9–10.3)
Chloride: 95 mmol/L — ABNORMAL LOW (ref 101–111)
Creatinine, Ser: 0.73 mg/dL (ref 0.61–1.24)
GFR calc Af Amer: >60 mL/min (ref >60)
GFR calc non Af Amer: >60 mL/min (ref >60)
GLUCOSE: 154 mg/dL — AB (ref 65–99)
POTASSIUM: 4.5 mmol/L (ref 3.5–5.1)
SODIUM: 131 mmol/L — AB (ref 135–145)
TOTAL PROTEIN: 7.2 g/dL (ref 6.5–8.1)

## 2016-06-17 LAB — CBC WITH DIFFERENTIAL/PLATELET
BASOS ABS: 0 10*3/uL (ref 0.0–0.1)
Basophils Relative: 0 %
EOS ABS: 0.2 10*3/uL (ref 0.0–0.7)
Eosinophils Relative: 1 %
HEMATOCRIT: 30.2 % — AB (ref 39.0–52.0)
Hemoglobin: 10.2 g/dL — ABNORMAL LOW (ref 13.0–17.0)
LYMPHS ABS: 2 10*3/uL (ref 0.7–4.0)
Lymphocytes Relative: 12 %
MCH: 30.7 pg (ref 26.0–34.0)
MCHC: 33.8 g/dL (ref 30.0–36.0)
MCV: 91 fL (ref 78.0–100.0)
Monocytes Absolute: 1 10*3/uL (ref 0.1–1.0)
Monocytes Relative: 6 %
NEUTROS ABS: 13.4 10*3/uL — AB (ref 1.7–7.7)
Neutrophils Relative %: 81 %
PLATELETS: 435 10*3/uL — AB (ref 150–400)
RBC: 3.32 MIL/uL — ABNORMAL LOW (ref 4.22–5.81)
RDW: 14 % (ref 11.5–15.5)
WBC: 16.6 10*3/uL — ABNORMAL HIGH (ref 4.0–10.5)

## 2016-06-17 LAB — BRAIN NATRIURETIC PEPTIDE: B Natriuretic Peptide: 99.1 pg/mL (ref 0.0–100.0)

## 2016-06-17 LAB — PROTIME-INR
INR: 1.17
PROTHROMBIN TIME: 15 s (ref 11.4–15.2)

## 2016-06-17 LAB — LACTIC ACID, PLASMA: Lactic Acid, Venous: 1.2 mmol/L (ref 0.5–1.9)

## 2016-06-17 LAB — PROCALCITONIN: Procalcitonin: <0.1 ng/mL

## 2016-06-17 LAB — TSH: TSH: 1.095 u[IU]/mL (ref 0.350–4.500)

## 2016-06-17 LAB — VANCOMYCIN, RANDOM: VANCOMYCIN RM: 7

## 2016-06-17 LAB — APTT: aPTT: 26 seconds (ref 24–36)

## 2016-06-17 MED ORDER — SODIUM CHLORIDE 0.9% FLUSH: 3.0000 mL | INTRAVENOUS | Status: DC | PRN

## 2016-06-17 MED ORDER — ACETAMINOPHEN 325 MG PO TABS: 650.0000 mg | ORAL_TABLET | Freq: Four times a day (QID) | ORAL | Status: DC | PRN

## 2016-06-17 MED ORDER — POLYETHYLENE GLYCOL 3350 17 G PO PACK: 17.0000 g | PACK | Freq: Every day | ORAL | Status: DC | PRN

## 2016-06-17 MED ORDER — ONDANSETRON HCL 4 MG PO TABS
4.0000 mg | ORAL_TABLET | Freq: Four times a day (QID) | ORAL | Status: DC | PRN
Start: 2016-06-17 — End: 2016-06-19

## 2016-06-17 MED ORDER — SODIUM CHLORIDE 0.9 % IV SOLN: 250.0000 mL | INTRAVENOUS | Status: DC | PRN

## 2016-06-17 MED ORDER — SODIUM CHLORIDE 0.9% FLUSH
3.0000 mL | Freq: Two times a day (BID) | INTRAVENOUS | Status: DC
  Administered 2016-06-18: 3 mL via INTRAVENOUS

## 2016-06-17 MED ORDER — HYDRALAZINE HCL 20 MG/ML IJ SOLN: 10.0000 mg | INTRAMUSCULAR | Status: DC | PRN

## 2016-06-17 MED ORDER — INSULIN ASPART 100 UNIT/ML ~~LOC~~ SOLN
0.0000 [IU] | Freq: Three times a day (TID) | SUBCUTANEOUS | Status: DC
  Administered 2016-06-18: 1 [IU] via SUBCUTANEOUS
  Administered 2016-06-18: 2 [IU] via SUBCUTANEOUS

## 2016-06-17 MED ORDER — VANCOMYCIN HCL 10 G IV SOLR
1250.0000 mg | Freq: Two times a day (BID) | INTRAVENOUS | Status: DC
  Administered 2016-06-18 (×2): 1250 mg via INTRAVENOUS
  Filled 2016-06-17 (×5): qty 1250

## 2016-06-17 MED ORDER — ONDANSETRON HCL 4 MG/2ML IJ SOLN: 4.0000 mg | Freq: Four times a day (QID) | INTRAMUSCULAR | Status: DC | PRN

## 2016-06-17 MED ORDER — INSULIN ASPART 100 UNIT/ML ~~LOC~~ SOLN: 0.0000 [IU] | Freq: Every day | SUBCUTANEOUS | Status: DC

## 2016-06-17 MED ORDER — MAGNESIUM SULFATE 2 GM/50ML IV SOLN
2.0000 g | Freq: Once | INTRAVENOUS | Status: AC
  Administered 2016-06-18: 2 g via INTRAVENOUS
  Filled 2016-06-17: qty 50

## 2016-06-17 MED ORDER — DEXTROSE 5 % IV SOLN
1900.0000 mg | INTRAVENOUS | Status: DC
  Administered 2016-06-18: 1900 mg via INTRAVENOUS
  Filled 2016-06-17 (×2): qty 7.6

## 2016-06-17 MED ORDER — HEPARIN SODIUM (PORCINE) 5000 UNIT/ML IJ SOLN
5000.0000 [IU] | Freq: Three times a day (TID) | INTRAMUSCULAR | Status: DC
  Administered 2016-06-17: 5000 [IU] via SUBCUTANEOUS
  Filled 2016-06-17: qty 1

## 2016-06-17 MED ORDER — SODIUM CHLORIDE 0.9 % IV SOLN
INTRAVENOUS | Status: DC
Start: 2016-06-17 — End: 2016-06-19
  Administered 2016-06-17: 1000 mL via INTRAVENOUS

## 2016-06-17 MED ORDER — SODIUM CHLORIDE 0.9% FLUSH
3.0000 mL | Freq: Two times a day (BID) | INTRAVENOUS | Status: DC
  Administered 2016-06-17 – 2016-06-18 (×3): 3 mL via INTRAVENOUS

## 2016-06-17 MED ORDER — SODIUM CHLORIDE 0.9 % IV SOLN
1.0000 g | Freq: Three times a day (TID) | INTRAVENOUS | Status: DC
  Administered 2016-06-18: 1 g via INTRAVENOUS
  Filled 2016-06-17 (×4): qty 1

## 2016-06-17 MED ORDER — ATORVASTATIN CALCIUM 20 MG PO TABS: 20.0000 mg | ORAL_TABLET | Freq: Every day | ORAL | Status: DC

## 2016-06-17 MED ORDER — GERHARDT'S BUTT CREAM
TOPICAL_CREAM | Freq: Every day | CUTANEOUS | Status: DC
  Administered 2016-06-17 – 2016-06-18 (×2): via TOPICAL
  Filled 2016-06-17: qty 1

## 2016-06-17 MED ORDER — LORAZEPAM 2 MG/ML IJ SOLN
0.5000 mg | INTRAMUSCULAR | Status: DC | PRN
  Administered 2016-06-19: 1 mg via INTRAVENOUS
  Administered 2016-06-20: 0.5 mg via INTRAVENOUS
  Administered 2016-06-20: 1 mg via INTRAVENOUS
  Filled 2016-06-17 (×3): qty 1

## 2016-06-17 MED ORDER — NICOTINE 21 MG/24HR TD PT24
21.0000 mg | MEDICATED_PATCH | Freq: Every day | TRANSDERMAL | Status: DC
  Administered 2016-06-17 – 2016-06-19 (×3): 21 mg via TRANSDERMAL
  Filled 2016-06-17 (×6): qty 1

## 2016-06-17 MED ORDER — ACETAMINOPHEN 650 MG RE SUPP: 650.0000 mg | Freq: Four times a day (QID) | RECTAL | Status: DC | PRN

## 2016-06-17 MED ORDER — BISACODYL 10 MG RE SUPP: 10.0000 mg | Freq: Every day | RECTAL | Status: DC | PRN

## 2016-06-17 MED ORDER — FENTANYL CITRATE (PF) 100 MCG/2ML IJ SOLN
12.5000 ug | INTRAMUSCULAR | Status: DC | PRN
  Administered 2016-06-20: 12.5 ug via INTRAVENOUS
  Administered 2016-06-20 – 2016-06-24 (×5): 25 ug via INTRAVENOUS
  Filled 2016-06-17 (×6): qty 2

## 2016-06-17 MED ORDER — SODIUM CHLORIDE 0.9 % IV SOLN
1.0000 g | INTRAVENOUS | Status: AC
  Administered 2016-06-17: 1 g via INTRAVENOUS
  Filled 2016-06-17: qty 1

## 2016-06-17 MED ORDER — FLUOXETINE HCL 20 MG PO CAPS: 20.0000 mg | ORAL_CAPSULE | Freq: Every day | ORAL | Status: DC

## 2016-06-17 MED ORDER — ALBUTEROL SULFATE (2.5 MG/3ML) 0.083% IN NEBU: 2.5000 mg | INHALATION_SOLUTION | RESPIRATORY_TRACT | Status: DC | PRN
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
## 2016-06-18 ENCOUNTER — Encounter (HOSPITAL_COMMUNITY): Payer: Self-pay | Admitting: *Deleted

## 2016-06-18 DIAGNOSIS — S069X6S Unspecified intracranial injury with loss of consciousness greater than 24 hours without return to pre-existing conscious level with patient surviving, sequela: Secondary | ICD-10-CM

## 2016-06-18 LAB — URINALYSIS, ROUTINE W REFLEX MICROSCOPIC
BILIRUBIN URINE: NEGATIVE
Glucose, UA: 50 mg/dL — AB
Ketones, ur: NEGATIVE mg/dL
Leukocytes, UA: NEGATIVE
NITRITE: NEGATIVE
PROTEIN: 100 mg/dL — AB
Specific Gravity, Urine: 1.024 (ref 1.005–1.030)
pH: 6 (ref 5.0–8.0)

## 2016-06-18 LAB — RESPIRATORY PANEL BY PCR
ADENOVIRUS-RVPPCR: NOT DETECTED
Bordetella pertussis: NOT DETECTED
CHLAMYDOPHILA PNEUMONIAE-RVPPCR: NOT DETECTED
CORONAVIRUS 229E-RVPPCR: NOT DETECTED
CORONAVIRUS HKU1-RVPPCR: NOT DETECTED
CORONAVIRUS NL63-RVPPCR: NOT DETECTED
CORONAVIRUS OC43-RVPPCR: NOT DETECTED
INFLUENZA A-RVPPCR: NOT DETECTED
Influenza B: NOT DETECTED
MYCOPLASMA PNEUMONIAE-RVPPCR: NOT DETECTED
Metapneumovirus: NOT DETECTED
PARAINFLUENZA VIRUS 1-RVPPCR: NOT DETECTED
Parainfluenza Virus 2: NOT DETECTED
Parainfluenza Virus 3: NOT DETECTED
Parainfluenza Virus 4: NOT DETECTED
Respiratory Syncytial Virus: NOT DETECTED
Rhinovirus / Enterovirus: NOT DETECTED

## 2016-06-18 LAB — GLUCOSE, CAPILLARY
GLUCOSE-CAPILLARY: 162 mg/dL — AB (ref 65–99)
GLUCOSE-CAPILLARY: 148 mg/dL — AB (ref 65–99)
Glucose-Capillary: 150 mg/dL — ABNORMAL HIGH (ref 65–99)

## 2016-06-18 LAB — COMPREHENSIVE METABOLIC PANEL
ALT: 91 U/L — ABNORMAL HIGH (ref 17–63)
ANION GAP: 10 (ref 5–15)
AST: 46 U/L — ABNORMAL HIGH (ref 15–41)
Albumin: 2.4 g/dL — ABNORMAL LOW (ref 3.5–5.0)
Alkaline Phosphatase: 146 U/L — ABNORMAL HIGH (ref 38–126)
BILIRUBIN TOTAL: 0.7 mg/dL (ref 0.3–1.2)
BUN: 28 mg/dL — ABNORMAL HIGH (ref 6–20)
CO2: 24 mmol/L (ref 22–32)
Calcium: 9.2 mg/dL (ref 8.9–10.3)
Chloride: 103 mmol/L (ref 101–111)
Creatinine, Ser: 0.66 mg/dL (ref 0.61–1.24)
GFR calc Af Amer: >60 mL/min (ref >60)
Glucose, Bld: 148 mg/dL — ABNORMAL HIGH (ref 65–99)
Potassium: 4.1 mmol/L (ref 3.5–5.1)
Sodium: 137 mmol/L (ref 135–145)
TOTAL PROTEIN: 6.7 g/dL (ref 6.5–8.1)

## 2016-06-18 LAB — URINALYSIS, MICROSCOPIC (REFLEX)

## 2016-06-18 LAB — CBC
HCT: 30.5 % — ABNORMAL LOW (ref 39.0–52.0)
HEMOGLOBIN: 9.9 g/dL — AB (ref 13.0–17.0)
MCH: 29.7 pg (ref 26.0–34.0)
MCHC: 32.5 g/dL (ref 30.0–36.0)
MCV: 91.6 fL (ref 78.0–100.0)
Platelets: 405 10*3/uL — ABNORMAL HIGH (ref 150–400)
RBC: 3.33 MIL/uL — AB (ref 4.22–5.81)
RDW: 13.7 % (ref 11.5–15.5)
WBC: 11.9 10*3/uL — AB (ref 4.0–10.5)

## 2016-06-18 LAB — PHOSPHORUS
PHOSPHORUS: 4.2 mg/dL (ref 2.5–4.6)
PHOSPHORUS: 4 mg/dL (ref 2.5–4.6)

## 2016-06-18 LAB — MAGNESIUM
MAGNESIUM: 1.9 mg/dL (ref 1.7–2.4)
MAGNESIUM: 1.9 mg/dL (ref 1.7–2.4)
MAGNESIUM: 1.7 mg/dL (ref 1.7–2.4)

## 2016-06-18 LAB — LACTIC ACID, PLASMA: LACTIC ACID, VENOUS: 1.1 mmol/L (ref 0.5–1.9)

## 2016-06-18 LAB — MRSA PCR SCREENING: MRSA by PCR: NEGATIVE

## 2016-06-18 MED ORDER — PRO-STAT SUGAR FREE PO LIQD
30.0000 mL | Freq: Two times a day (BID) | ORAL | Status: DC
  Administered 2016-06-18: 30 mL
  Filled 2016-06-18: qty 30

## 2016-06-18 MED ORDER — VITAL HIGH PROTEIN PO LIQD
1000.0000 mL | ORAL | Status: DC
  Administered 2016-06-18: 1000 mL
  Filled 2016-06-18 (×2): qty 1000

## 2016-06-18 MED ORDER — MAGNESIUM OXIDE 400 (241.3 MG) MG PO TABS
400.0000 mg | ORAL_TABLET | Freq: Two times a day (BID) | ORAL | Status: DC
  Administered 2016-06-18: 400 mg via ORAL
  Filled 2016-06-18: qty 1

## 2016-06-18 MED ORDER — ACETAMINOPHEN 160 MG/5ML PO SOLN
650.0000 mg | Freq: Four times a day (QID) | ORAL | Status: DC | PRN
  Administered 2016-06-18: 650 mg
  Filled 2016-06-18: qty 20.3

## 2016-06-18 MED ORDER — FLUOXETINE HCL 20 MG/5ML PO SOLN
20.0000 mg | Freq: Every day | ORAL | Status: DC
  Administered 2016-06-18 – 2016-06-21 (×3): 20 mg
  Filled 2016-06-18 (×5): qty 5

## 2016-06-18 NOTE — Progress Notes (Signed)
Received pt to 3S15 via ambulance.  Report received from RN.  Pt placed in bed, RT called for trach management.  Pt found to have 6.5 Trach, on 28% TC.  Copious secretions.  Rhonchus throughout.  NTS for large amount of tan thick sputum. Pt alert, does not follow commands or try to respond.  Eyes open and will track momentarily.  Right side flaccid, left arm minimal movement.  Pt unable to manage oral secretions.  Pitting edema to extremities.   Indwelling foley on arrival, urine dark amber in color.  Right hand PIV, unable to flush, no blood return.  IV discontinued.  IV Therapy notified.  Panda tube to Left nare, ausculated for placement.  No feedings ordered.  Healing crani incision noted.  Sacrum with Stage 3, Bilateral heels stage 2, Skin care consult ordered.  Barrier cream and foam to sacrum.    Wife to bedside.  Passport given to wife, advised to take home.  All personal belongings to go home with wife.  Discussed hospital course with wife.  Dr. Antionette Charpyd arrived to see patient.
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
## 2016-06-18 NOTE — Procedures (Signed)
Tracheostomy Change Note  Patient Details:   Name: [name] RalphHerman Herro Jr. DOB: 1965/08/28 MRN: [id number]    Airway Documentation:     Evaluation  O2 sats: currently acceptable Complications: No apparent complications Patient did tolerate procedure well. Bilateral Breath Sounds: Rhonchi    [name] [name], [name] [name] [facility] 06/18/2016, 12:57 AM

## 2016-06-18 NOTE — Progress Notes (Signed)
Brief Nutrition Note  Consult received for enteral/tube feeding initiation and management.  Adult Enteral Nutrition Protocol initiated. Full assessment to follow.  Admitting Dx: INTRACEREBRAL HEMORRHAGE  Body mass index is 29.46 kg/m. Pt meets criteria for overweight based on current BMI.  Labs:   Recent Labs Lab 2016/08/27 2153  NA 131*  K 4.5  CL 95*  CO2 23  BUN 31*  CREATININE 0.73  CALCIUM 9.4  MG 1.6*  GLUCOSE 154*    Tilda FrancoLindsey Marvie Calender, MS, RD, LDN Pager: [phone] After Hours Pager: [phone]

## 2016-06-19 DIAGNOSIS — Z7189 Other specified counseling: Secondary | ICD-10-CM

## 2016-06-19 LAB — HEMOGLOBIN A1C
Hgb A1c MFr Bld: 6.6 % — ABNORMAL HIGH (ref 4.8–5.6)
MEAN PLASMA GLUCOSE: 143 mg/dL

## 2016-06-19 LAB — URINE CULTURE: CULTURE: NO GROWTH

## 2016-06-19 MED ORDER — ATROPINE SULFATE 1 % OP SOLN
4.0000 [drp] | OPHTHALMIC | Status: DC | PRN
  Filled 2016-06-19: qty 5

## 2016-06-19 MED ORDER — ALBUTEROL SULFATE (2.5 MG/3ML) 0.083% IN NEBU: 2.5000 mg | INHALATION_SOLUTION | RESPIRATORY_TRACT | Status: DC | PRN

## 2016-06-19 NOTE — Clinical Social Work Note (Signed)
CSW acknowledges consult for new SNF placement, awaiting PT Evaluation/Recommendations. CSW will continue to follow.  [name] [name],MSW, LCSWA Clinical Social Work Dept Weekend Social Worker [phone] 10:05 AM
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
## 2016-06-19 NOTE — Progress Notes (Signed)
Patient arrived to floor. Report received from [place], [place]. Patient alert but non-verbal.

## 2016-06-19 NOTE — Progress Notes (Signed)
Nutrition Brief Note  Chart reviewed. Pt now transitioning to comfort care.  No further nutrition interventions warranted at this time.  Please re-consult as needed.   Annjanette Wertenberger A. Baxter Gonzalez, RD, LDN, CDE Pager: [phone] After hours Pager: [phone]  

## 2016-06-19 NOTE — Progress Notes (Signed)
Report called to [facility] RN.  Will transfer pt in bed to 6N22.  Wife, Misty StanleyLisa notified of transfer and room number.  WIfe agreeable to move.

## 2016-06-19 NOTE — Progress Notes (Signed)
Pt is alert, nods head with yes or no question. Trach intact, with trach mask on at 3l. Trach suctioning done several times today, with mod amount of yellowish sputum noted. A lot of family members came in today. Pt's wife and mother was able to talk to Palliative and attending Drs this morning. Stage 2 pressure ulcer to sacrum, dressing changed, repositioned pt with pillow every 2 hrs.

## 2016-06-20 DIAGNOSIS — S069X0S Unspecified intracranial injury without loss of consciousness, sequela: Secondary | ICD-10-CM

## 2016-06-20 LAB — AMIKACIN, TROUGH: Amikacin Tr: 9.1 ug/mL — ABNORMAL HIGH (ref 1.0–8.0)

## 2016-06-20 NOTE — Progress Notes (Signed)
OT Cancellation and Discharge Note  Patient Details Name: [name] RalphHerman Asato Jr. MRN: [id number] DOB: 25-Nov-1965   Cancelled Treatment:    Reason Eval/Treat Not Completed: Medical issues which prohibited therapy.  Pt is now for full comfort care.  OT will discharge services at this time.  Jayci Ellefson [place], OTR/L [phone]   Jeani HawkingConarpe, Rikia Sukhu M 06/20/2016, 11:07 AM

## 2016-06-20 NOTE — Progress Notes (Signed)
PT Cancellation Note  Patient Details Name: [name] RalphHerman Maenza Jr. MRN: [id number] DOB: May 22, 1966   Cancelled Treatment:    Reason Eval/Treat Not Completed: Other (comment) (pt is comfort care) RN confirms that pt is comfort care. Will sign off at this time.    Gaye PollackRebecca Kim 06/20/2016, 11:00 AM Gaye Pollackebecca Kim, SPT [phone]  [facility] Emalie Mcwethy,PT Acute Rehabilitation [phone])
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
## 2016-06-21 DIAGNOSIS — S069X9S Unspecified intracranial injury with loss of consciousness of unspecified duration, sequela: Secondary | ICD-10-CM

## 2016-06-21 LAB — CULTURE, RESPIRATORY W GRAM STAIN

## 2016-06-21 LAB — CULTURE, RESPIRATORY

## 2016-06-21 MED ORDER — SODIUM CHLORIDE 0.9 % IV SOLN
200.0000 ug/h | INTRAVENOUS | Status: DC
  Administered 2016-06-21: 10 ug/h via INTRAVENOUS
  Administered 2016-06-24: 40 ug/h via INTRAVENOUS
  Administered 2016-06-25: 50 ug/h via INTRAVENOUS
  Administered 2016-06-27: 150 ug/h via INTRAVENOUS
  Filled 2016-06-21 (×5): qty 50

## 2016-06-21 MED ORDER — SODIUM CHLORIDE 0.9 % IV SOLN
4.0000 mg/h | INTRAVENOUS | Status: DC
  Administered 2016-06-21: 1 mg/h via INTRAVENOUS
  Administered 2016-06-22 – 2016-06-25 (×4): 2 mg/h via INTRAVENOUS
  Administered 2016-06-26: 3 mg/h via INTRAVENOUS
  Administered 2016-06-27: 4 mg/h via INTRAVENOUS
  Filled 2016-06-21 (×8): qty 10

## 2016-06-21 MED ORDER — MIDAZOLAM BOLUS VIA INFUSION
1.0000 mg | INTRAVENOUS | Status: DC | PRN
  Administered 2016-06-22 – 2016-06-25 (×8): 1 mg via INTRAVENOUS
  Filled 2016-06-21: qty 1

## 2016-06-21 MED ORDER — GLYCOPYRROLATE 0.2 MG/ML IJ SOLN
0.1000 mg | Freq: Two times a day (BID) | INTRAMUSCULAR | Status: DC
  Administered 2016-06-21 – 2016-06-26 (×11): 0.1 mg via INTRAVENOUS
  Filled 2016-06-21 (×12): qty 1

## 2016-06-21 MED ORDER — FENTANYL BOLUS VIA INFUSION
20.0000 ug | INTRAVENOUS | Status: DC | PRN
  Administered 2016-06-22: 20 ug via INTRAVENOUS
  Filled 2016-06-21: qty 20

## 2016-06-21 NOTE — Plan of Care (Signed)
Problem: Education: Goal: Knowledge of [facility] Education information/materials will improve Outcome: Completed/Met Date Met: 06/21/16 With family

## 2016-06-21 NOTE — Progress Notes (Signed)
RT added [place] to ATC set up. 

## 2016-06-21 NOTE — Progress Notes (Signed)
Patient has Living Will confirming desire to not have his life prolonged in his current state- severe brain injury with no chance of meaningful recovery. Wife requesting full comfort care. There has been conflict between wife and mother on patient's wish to not be allowed to suffer in his current condition.   I met with his wife today alone- she requests no conversations be had at bedside or with anyone other than her.  We discussed comfort care options and residential hospice.  He has increased diaphoresis, htn and tachycardia which may be a sign of increasing discomfort or neuro-storming.  Recommendations:  1. Anticipate death soon, probably not stable enough to be transported in his current condition. 2. Start fentanyl infusion-may have less itching which has been a problem in the past with morphine 3. Start low dose versed infusion for seizure prophylaxis and neuro-storming.  Lane Hacker, DO Palliative Medicine  Time: 50 minutes Greater than 50%  of this time was spent counseling and coordinating care related to the above assessment and plan.
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
## 2016-06-22 LAB — CULTURE, BLOOD (ROUTINE X 2)
CULTURE: NO GROWTH
CULTURE: NO GROWTH

## 2016-06-22 MED ORDER — KETOROLAC TROMETHAMINE 30 MG/ML IJ SOLN
30.0000 mg | Freq: Four times a day (QID) | INTRAMUSCULAR | Status: DC
  Administered 2016-06-22 – 2016-06-27 (×18): 30 mg via INTRAVENOUS
  Filled 2016-06-22 (×18): qty 1

## 2016-06-22 MED ORDER — SODIUM CHLORIDE 0.9 % IV SOLN
INTRAVENOUS | Status: DC
  Administered 2016-06-22 – 2016-06-25 (×16): via INTRAVENOUS
  Filled 2016-06-22 (×26): qty 25

## 2016-06-22 MED ORDER — FENTANYL BOLUS VIA INFUSION
50.0000 ug | INTRAVENOUS | Status: DC | PRN
Start: 2016-06-22 — End: 2016-06-26
  Administered 2016-06-22 – 2016-06-25 (×8): 50 ug via INTRAVENOUS
  Filled 2016-06-22: qty 50

## 2016-06-22 MED ORDER — WHITE PETROLATUM GEL
Status: AC
  Filled 2016-06-22: qty 1

## 2016-06-22 MED ORDER — PROPRANOLOL HCL 1 MG/ML IV SOLN
10.0000 mg | INTRAVENOUS | Status: DC
  Filled 2016-06-22: qty 10
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
## 2016-06-22 NOTE — Progress Notes (Addendum)
Wife asking if we can d/c oxygen and switch to humidified room air. Wife spoke with RT. MD paged to clarify order. MD verbal order to switch to room air per wife's request as well okay to d/c continuous pulse ox per wife's request.
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
## 2016-06-24 DIAGNOSIS — Z515 Encounter for palliative care: Secondary | ICD-10-CM

## 2016-06-24 DIAGNOSIS — R0682 Tachypnea, not elsewhere classified: Secondary | ICD-10-CM

## 2016-06-24 MED ORDER — FENTANYL BOLUS VIA INFUSION
100.0000 ug | Freq: Once | INTRAVENOUS | Status: AC
  Administered 2016-06-24: 100 ug via INTRAVENOUS
  Filled 2016-06-24: qty 100
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
## 2016-06-25 MED ORDER — SODIUM CHLORIDE 0.9 % IV SOLN
Freq: Four times a day (QID) | INTRAVENOUS | Status: DC
  Administered 2016-06-25 – 2016-06-27 (×7): via INTRAVENOUS
  Filled 2016-06-25 (×11): qty 25

## 2016-06-25 NOTE — Progress Notes (Signed)
Witnessed Whole FoodsMarissa [name] waste 140 ml of fentanyl

## 2016-06-25 NOTE — Progress Notes (Signed)
Pt having 18 second periods of apnea. Wife at bedside. Pt appears comfortable.
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
## 2016-06-25 NOTE — Progress Notes (Signed)
Wasted 140ml of Fentanyl Iv, witnessed by [facility]ristie.

## 2016-06-26 DIAGNOSIS — R7881 Bacteremia: Secondary | ICD-10-CM

## 2016-06-26 DIAGNOSIS — I10 Essential (primary) hypertension: Secondary | ICD-10-CM

## 2016-06-26 MED ORDER — MIDAZOLAM BOLUS VIA INFUSION
1.0000 mg | INTRAVENOUS | Status: AC
  Administered 2016-06-26: 1 mg via INTRAVENOUS
  Filled 2016-06-26: qty 1

## 2016-06-26 MED ORDER — FENTANYL BOLUS VIA INFUSION
100.0000 ug | INTRAVENOUS | Status: DC | PRN
  Administered 2016-06-27 (×2): 100 ug via INTRAVENOUS
  Filled 2016-06-26: qty 100

## 2016-06-26 MED ORDER — FENTANYL BOLUS VIA INFUSION
50.0000 ug | INTRAVENOUS | Status: AC
  Administered 2016-06-26: 50 ug via INTRAVENOUS
  Filled 2016-06-26: qty 50

## 2016-06-26 NOTE — Progress Notes (Signed)
Palliative Medicine RN Note: Followed up with pt; family feels that he is not comfortable. Obtained orders to increase continuous infusion now and at 2000; also got titration orders for fentanyl. Plan f/u tomorrow by PMT member.  Margret ChanceMelanie G. Aniket Paye, RN, BSN, [facility] 06/26/2016 4:26 PM Cell [phone] 8:00-4:00 Monday-Friday Office [phone]
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
## 2016-06-26 NOTE — Progress Notes (Signed)
Palliative Medicine RN Note: Wife and sister in law at bedside. Pt is hot to touch and completely non-responsive. RR26-28 during my visit. Family feels he is not comfortable.   Discussed pt with Dr Phillips OdorGolding, who adjusted [name] [name]'s medication regimen and ordered stat doses of Versed and Fentanyl to get pt comfortable. PMT team member will follow up this afternoon.  [name] ChanceMelanie G. Jya Hughston, RN, BSN, [facility] 06/26/2016 12:04 PM Cell [phone] 8:00-4:00 Monday-Friday Office [phone]

## 2016-06-27 DIAGNOSIS — E871 Hypo-osmolality and hyponatremia: Secondary | ICD-10-CM

## 2016-06-27 DIAGNOSIS — E669 Obesity, unspecified: Secondary | ICD-10-CM

## 2016-06-27 DIAGNOSIS — Z9889 Other specified postprocedural states: Secondary | ICD-10-CM

## 2016-06-27 DIAGNOSIS — Z515 Encounter for palliative care: Secondary | ICD-10-CM

## 2016-06-27 DIAGNOSIS — E1169 Type 2 diabetes mellitus with other specified complication: Secondary | ICD-10-CM

## 2016-06-27 DIAGNOSIS — Z930 Tracheostomy status: Secondary | ICD-10-CM

## 2016-06-27 MED ORDER — FENTANYL BOLUS VIA INFUSION
150.0000 ug | INTRAVENOUS | Status: DC | PRN
  Administered 2016-06-27: 150 ug via INTRAVENOUS
  Filled 2016-06-27: qty 150

## 2016-07-13 NOTE — Progress Notes (Signed)
Fentanyl 120ml and Versed 55ml wasted in sink. Witnessed by Sharyn DrossZenaida Calwitan, RN.

## 2016-07-13 NOTE — Progress Notes (Addendum)
Pt with no respirations or pulse at 1141. Second assessment completed by Alferd ApaJoan Kiang, RN. Sister-in-law at bedside and wife is en route to hospital.

## 2016-07-13 NOTE — Progress Notes (Addendum)
Pt's wife refuses eye prep.
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
## 2016-07-13 NOTE — Care Management Note (Signed)
Case Management Note  Patient Details  Name: Jimmey RalphHerman Ensminger Jr. MRN: [id number] Date of Birth: [birth year]  Subjective/Objective:                    Action/Plan: Pt on IV fentanyl and versed. Plan is for hospital death. CM following.   Expected Discharge Date:  07/21/16               Expected Discharge Plan:     In-House Referral:     Discharge planning Services     Post Acute Care Choice:    Choice offered to:     DME Arranged:    DME Agency:     HH Arranged:    HH Agency:     Status of Service:  In process, will continue to follow  If discussed at Long Length of Stay Meetings, dates discussed:    Additional Comments:  Kermit BaloKelli F Nitya Cauthon, RN April 26, 2017, 11:11 AM

## 2016-07-13 NOTE — Progress Notes (Signed)
Palliative Medicine RN Note: Daily PMT follow up. Pt is gasping; family not comfortable with his work of breathing. RN just gave prn dose of fentanyl and increased continuous rate per titration orders. Obtained order from Dr Phillips OdorGolding to increase basal rate again this afternoon and to increase Versed rate now.  PMT will follow up this afternoon.  [name] ChanceMelanie G. Waller Marcussen, RN, BSN, [facility] 01-28-2017 10:03 AM Cell [phone] 8:00-4:00 Monday-Friday Office [phone]

## 2016-07-13 DEATH — deceased

## 2016-10-16 ENCOUNTER — Ambulatory Visit: Payer: Managed Care, Other (non HMO) | Admitting: Nurse Practitioner

## 2017-11-14 IMAGING — CT CT HEAD CODE STROKE
3 of 4 series · 15 of 47 positions shown, 18 images · non-contrast
Comparison: None.

CLINICAL DATA: Code stroke. Left-sided numbness. Slurred speech.
History of hypertension and hyperlipidemia.

EXAM:
CT HEAD WITHOUT CONTRAST
TECHNIQUE: Contiguous axial images were obtained from the base of the skull
through the vertex without intravenous contrast.

[Series 2: head w/o · axial · non-contrast · 0.46mm/px · z∈[-659,-529]mm · 9 of 34 slices shown, 12 images]
[im 4/34  brain]
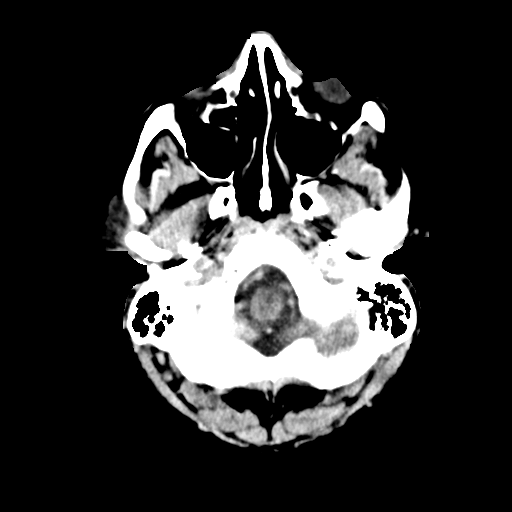
[im 4/34  bone]
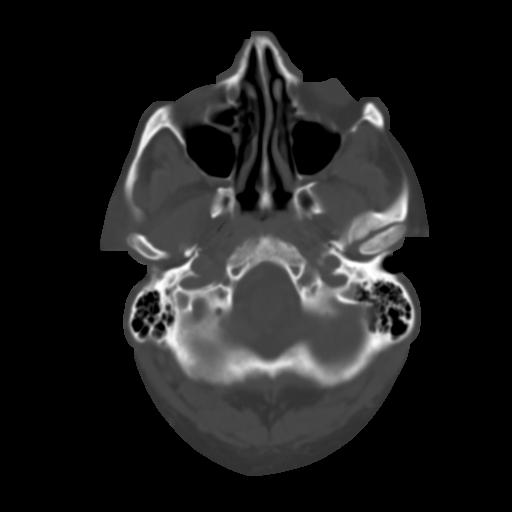
[im 7/34  brain]
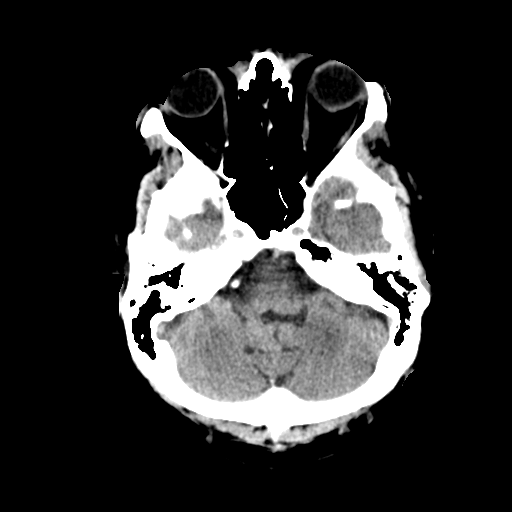
[im 10/34  brain]
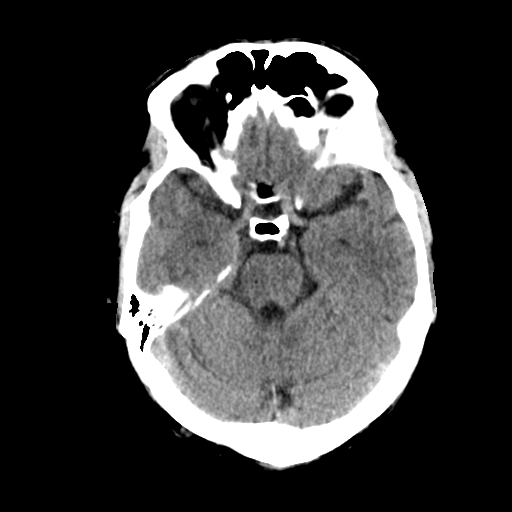
[im 14/34  brain]
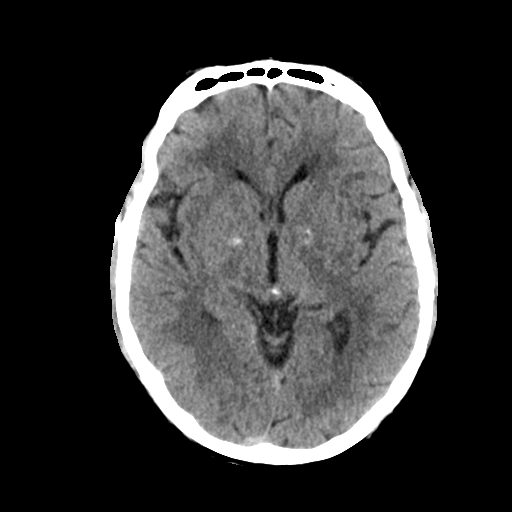
[im 17/34  brain]
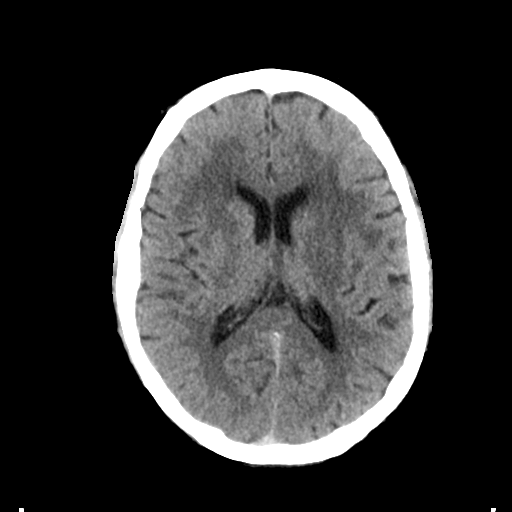
[im 17/34  bone]
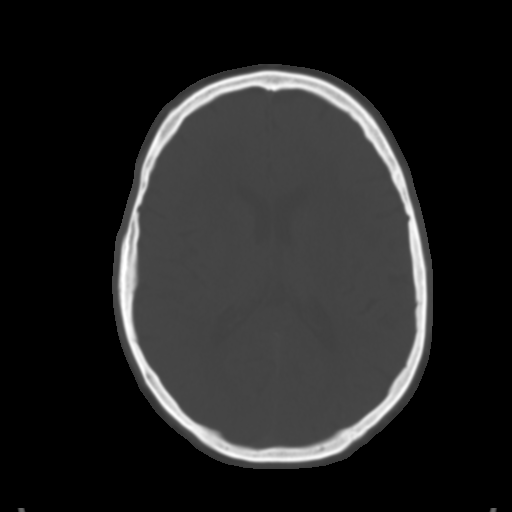
[im 20/34  brain]
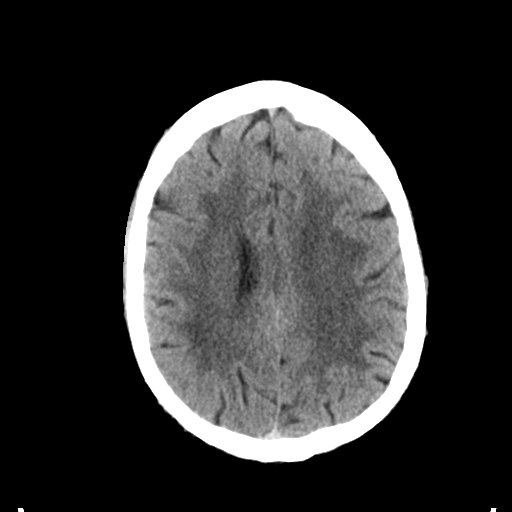
[im 24/34  brain]
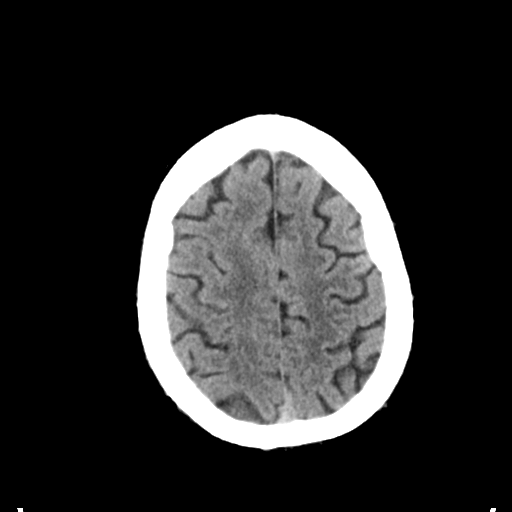
[im 27/34  brain]
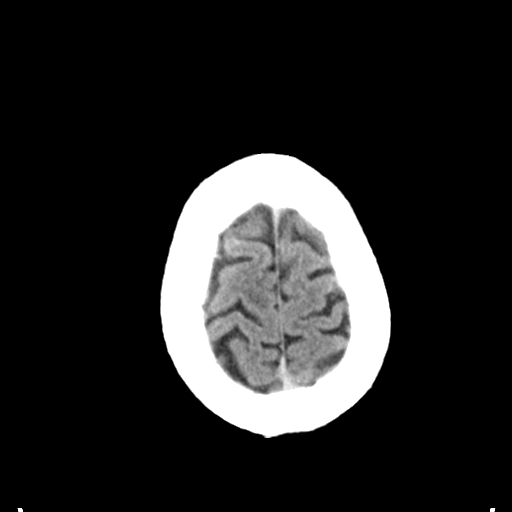
[im 30/34  brain]
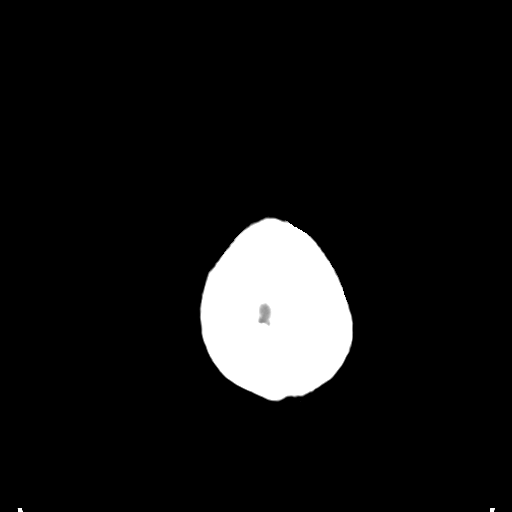
[im 30/34  bone]
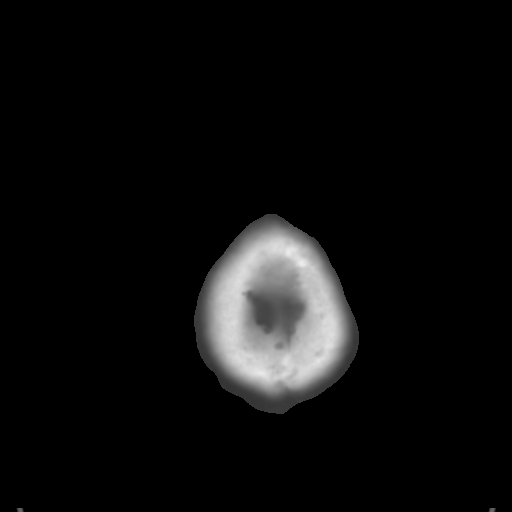

[Series 5: coronal · coronal · 0.32mm/px · 3 of 69 slices shown]
[im 23/69  brain]
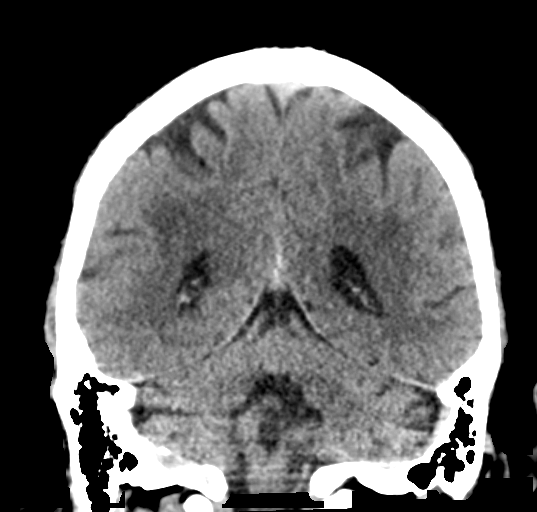
[im 31/69  brain]
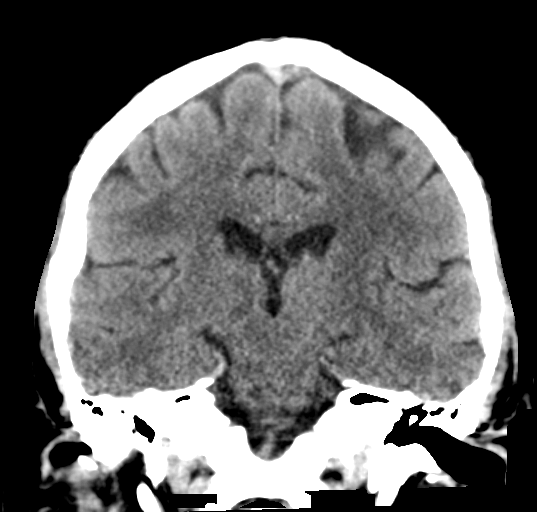
[im 38/69  brain]
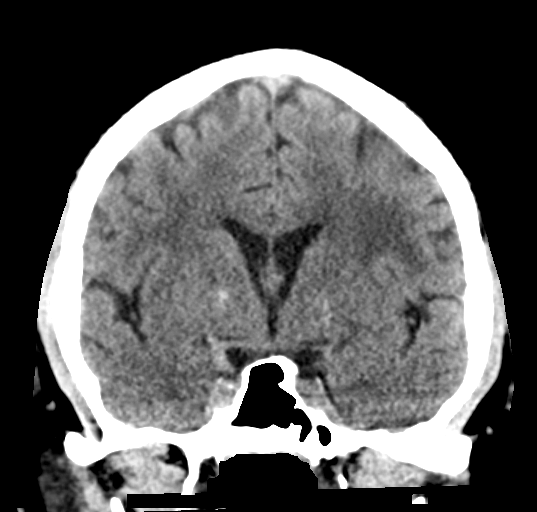

[Series 6: sagittal · sagittal · 0.34mm/px · 3 of 56 slices shown]
[im 19/56  brain]
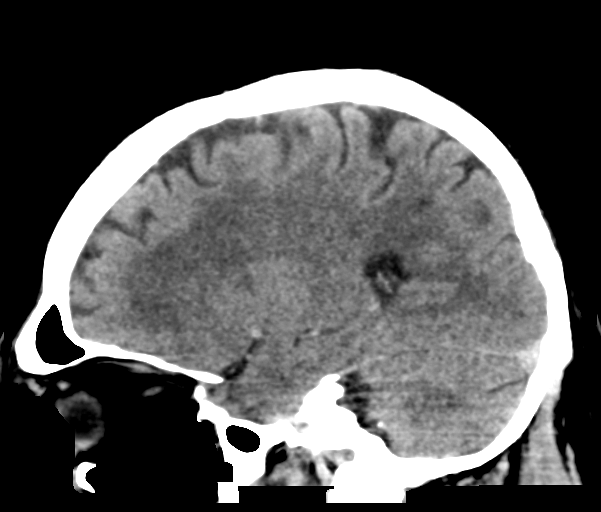
[im 28/56  brain]
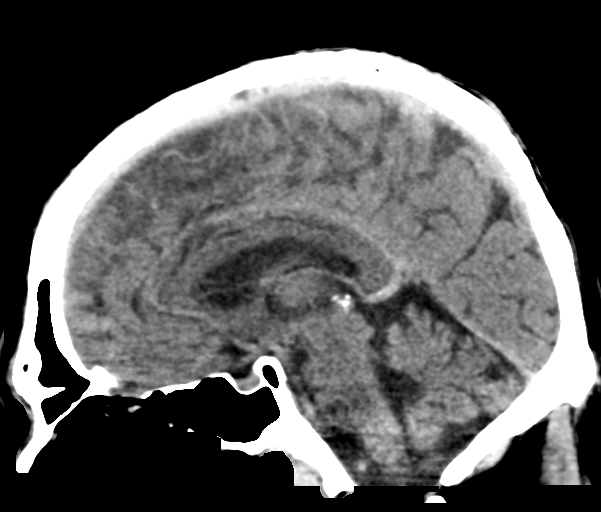
[im 37/56  brain]
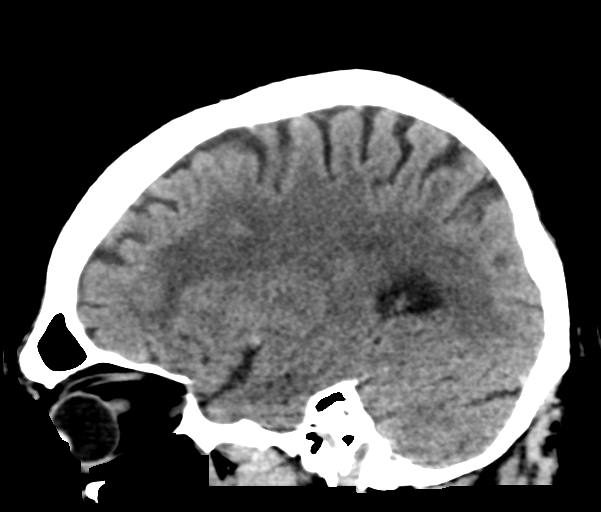

[15 of 47 positions shown; findings below may reference images not displayed]

FINDINGS: There is no evidence of acute cortical infarct, intracranial
hemorrhage, mass, midline shift, or extra-axial fluid collection.
The ventricles and sulci are normal. There are extensive, patchy and
confluent hypodensities in the cerebral white matter bilaterally.
There is a 7 mm focus of mild hypoattenuation in the right thalamus
suggestive of a lacunar infarct and of indeterminate age.

Orbits are unremarkable. Mild right maxillary sinus mucosal
thickening is partially visualized. The mastoid air cells are clear.
No acute osseous abnormality is identified.

ASPECTS (Alberta Stroke Program Early CT Score,
[URL]

- Ganglionic level infarction (caudate, lentiform nuclei, internal
capsule, insula, M1-M3 cortex): 7

- Supraganglionic infarction (M4-M6 cortex): 3

Total score (0-10 with 10 being normal): 10
IMPRESSION: 1. No evidence of acute intracranial hemorrhage or acute cortical
infarct.
2. Age indeterminate lacunar infarct in the right thalamus, possibly
recent.
3. ASPECTS score 10.
4. Extensive cerebral white matter disease, markedly advanced for
age and nonspecific but may reflect chronic small vessel ischemia.

These results were called by telephone at the time of interpretation
on 01/14/2016 at [DATE] to Dr. Apan, who verbally acknowledged
these results.
